# Patient Record
Sex: Male | Born: 1940 | Race: White | Hispanic: No | Marital: Single | Smoking: Current some day smoker
Health system: Southern US, Community
[De-identification: ages and names within clinical notes are randomized; demographics above are authoritative.]

## PROBLEM LIST (undated history)

## (undated) DIAGNOSIS — H409 Unspecified glaucoma: Secondary | ICD-10-CM

## (undated) DIAGNOSIS — R3911 Hesitancy of micturition: Secondary | ICD-10-CM

## (undated) DIAGNOSIS — I10 Essential (primary) hypertension: Secondary | ICD-10-CM

## (undated) DIAGNOSIS — K219 Gastro-esophageal reflux disease without esophagitis: Secondary | ICD-10-CM

## (undated) DIAGNOSIS — N3943 Post-void dribbling: Secondary | ICD-10-CM

## (undated) DIAGNOSIS — E669 Obesity, unspecified: Secondary | ICD-10-CM

## (undated) DIAGNOSIS — N400 Enlarged prostate without lower urinary tract symptoms: Secondary | ICD-10-CM

## (undated) DIAGNOSIS — D126 Benign neoplasm of colon, unspecified: Secondary | ICD-10-CM

## (undated) DIAGNOSIS — N3281 Overactive bladder: Secondary | ICD-10-CM

## (undated) DIAGNOSIS — E291 Testicular hypofunction: Secondary | ICD-10-CM

## (undated) DIAGNOSIS — N529 Male erectile dysfunction, unspecified: Secondary | ICD-10-CM

## (undated) DIAGNOSIS — R5383 Other fatigue: Secondary | ICD-10-CM

## (undated) DIAGNOSIS — C801 Malignant (primary) neoplasm, unspecified: Secondary | ICD-10-CM

## (undated) DIAGNOSIS — R351 Nocturia: Secondary | ICD-10-CM

## (undated) HISTORY — PX: HERNIA REPAIR: SHX51

## (undated) HISTORY — DX: Hesitancy of micturition: R39.11

## (undated) HISTORY — DX: Male erectile dysfunction, unspecified: N52.9

## (undated) HISTORY — DX: Post-void dribbling: N39.43

## (undated) HISTORY — DX: Essential (primary) hypertension: I10

## (undated) HISTORY — DX: Unspecified glaucoma: H40.9

## (undated) HISTORY — DX: Overactive bladder: N32.81

## (undated) HISTORY — DX: Gastro-esophageal reflux disease without esophagitis: K21.9

## (undated) HISTORY — DX: Benign prostatic hyperplasia without lower urinary tract symptoms: N40.0

## (undated) HISTORY — DX: Obesity, unspecified: E66.9

## (undated) HISTORY — DX: Testicular hypofunction: E29.1

## (undated) HISTORY — PX: BOWEL RESECTION: SHX1257

## (undated) HISTORY — DX: Nocturia: R35.1

## (undated) HISTORY — DX: Benign neoplasm of colon, unspecified: D12.6

---

## 2006-01-18 ENCOUNTER — Ambulatory Visit: Payer: Self-pay | Admitting: Gastroenterology

## 2011-05-25 ENCOUNTER — Ambulatory Visit: Payer: Self-pay | Admitting: Gastroenterology

## 2011-05-27 LAB — PATHOLOGY REPORT

## 2013-08-07 DIAGNOSIS — R7303 Prediabetes: Secondary | ICD-10-CM | POA: Insufficient documentation

## 2013-08-07 DIAGNOSIS — M6530 Trigger finger, unspecified finger: Secondary | ICD-10-CM | POA: Insufficient documentation

## 2013-08-07 DIAGNOSIS — N400 Enlarged prostate without lower urinary tract symptoms: Secondary | ICD-10-CM | POA: Insufficient documentation

## 2013-08-07 DIAGNOSIS — H409 Unspecified glaucoma: Secondary | ICD-10-CM

## 2013-08-07 HISTORY — DX: Unspecified glaucoma: H40.9

## 2014-02-07 DIAGNOSIS — M19049 Primary osteoarthritis, unspecified hand: Secondary | ICD-10-CM | POA: Insufficient documentation

## 2014-03-28 DIAGNOSIS — L309 Dermatitis, unspecified: Secondary | ICD-10-CM | POA: Insufficient documentation

## 2014-08-12 DIAGNOSIS — D126 Benign neoplasm of colon, unspecified: Secondary | ICD-10-CM

## 2014-08-12 HISTORY — DX: Benign neoplasm of colon, unspecified: D12.6

## 2014-08-13 DIAGNOSIS — E291 Testicular hypofunction: Secondary | ICD-10-CM | POA: Insufficient documentation

## 2014-08-13 DIAGNOSIS — Z79899 Other long term (current) drug therapy: Secondary | ICD-10-CM | POA: Insufficient documentation

## 2015-02-18 DIAGNOSIS — IMO0002 Reserved for concepts with insufficient information to code with codable children: Secondary | ICD-10-CM | POA: Insufficient documentation

## 2015-02-22 ENCOUNTER — Other Ambulatory Visit: Payer: Self-pay | Admitting: Urology

## 2015-02-22 DIAGNOSIS — N400 Enlarged prostate without lower urinary tract symptoms: Secondary | ICD-10-CM

## 2015-05-21 ENCOUNTER — Encounter: Payer: Self-pay | Admitting: *Deleted

## 2015-05-22 ENCOUNTER — Other Ambulatory Visit: Payer: Self-pay | Admitting: Urology

## 2015-05-22 ENCOUNTER — Other Ambulatory Visit: Payer: Self-pay

## 2015-05-22 DIAGNOSIS — R972 Elevated prostate specific antigen [PSA]: Secondary | ICD-10-CM

## 2015-05-22 NOTE — Telephone Encounter (Signed)
Patient is requesting a refill on his finasteride.  He will need an appointment for his yearly physical.  He may have refills to get him to his appointment time.

## 2015-05-23 ENCOUNTER — Other Ambulatory Visit: Payer: Self-pay

## 2015-05-23 ENCOUNTER — Other Ambulatory Visit: Payer: PPO

## 2015-05-23 ENCOUNTER — Other Ambulatory Visit: Payer: Self-pay | Admitting: Urology

## 2015-05-23 DIAGNOSIS — N400 Enlarged prostate without lower urinary tract symptoms: Secondary | ICD-10-CM

## 2015-05-23 DIAGNOSIS — R972 Elevated prostate specific antigen [PSA]: Secondary | ICD-10-CM

## 2015-05-23 MED ORDER — FINASTERIDE 5 MG PO TABS: 5.0000 mg | ORAL_TABLET | Freq: Every day | ORAL | Status: DC

## 2015-05-23 NOTE — Progress Notes (Signed)
Pt medication was refilled due to pt having labs drawn this morning and f/u appt with [facility] 06/02/15.

## 2015-05-23 NOTE — Telephone Encounter (Signed)
LMOM- unable to refill medication due to him needing a yearly f/u.

## 2015-05-24 LAB — PSA: Prostate Specific Ag, Serum: 0.3 ng/mL (ref 0.0–4.0)

## 2015-05-28 ENCOUNTER — Ambulatory Visit: Payer: Self-pay | Admitting: Urology

## 2015-06-03 ENCOUNTER — Encounter: Payer: Self-pay | Admitting: Urology

## 2015-06-03 ENCOUNTER — Ambulatory Visit (INDEPENDENT_AMBULATORY_CARE_PROVIDER_SITE_OTHER): Payer: PPO | Admitting: Urology

## 2015-06-03 VITALS — BP 145/80 | HR 73 | Ht 70.0 in | Wt 257.0 lb

## 2015-06-03 DIAGNOSIS — K219 Gastro-esophageal reflux disease without esophagitis: Secondary | ICD-10-CM

## 2015-06-03 DIAGNOSIS — N529 Male erectile dysfunction, unspecified: Secondary | ICD-10-CM | POA: Insufficient documentation

## 2015-06-03 DIAGNOSIS — N528 Other male erectile dysfunction: Secondary | ICD-10-CM | POA: Diagnosis not present

## 2015-06-03 DIAGNOSIS — N401 Enlarged prostate with lower urinary tract symptoms: Secondary | ICD-10-CM | POA: Diagnosis not present

## 2015-06-03 DIAGNOSIS — N138 Other obstructive and reflux uropathy: Secondary | ICD-10-CM

## 2015-06-03 DIAGNOSIS — M545 Low back pain, unspecified: Secondary | ICD-10-CM | POA: Insufficient documentation

## 2015-06-03 DIAGNOSIS — I10 Essential (primary) hypertension: Secondary | ICD-10-CM

## 2015-06-03 DIAGNOSIS — E785 Hyperlipidemia, unspecified: Secondary | ICD-10-CM | POA: Insufficient documentation

## 2015-06-03 HISTORY — DX: Essential (primary) hypertension: I10

## 2015-06-03 HISTORY — DX: Gastro-esophageal reflux disease without esophagitis: K21.9

## 2015-06-03 MED ORDER — FINASTERIDE 5 MG PO TABS: 5.0000 mg | ORAL_TABLET | Freq: Every day | ORAL | Status: DC

## 2015-06-03 NOTE — Progress Notes (Signed)
06/03/2015 10:04 AM   Zachary Robertson [birth year] [id number]  Referring provider: No referring provider defined for this encounter.  Chief Complaint  Patient presents with  . Benign Prostatic Hypertrophy    1year    HPI: Patient is 75 year old Caucasian male who presents today for her 1 year follow-up for erectile dysfunction and BPH with LUTS.  Erectile dysfunction His SHIM score is 5, which is severe erectile dysfunction.   He has been having difficulty with erections for over ten years.   His major complaint is no partner.  His libido is preserved.   His risk factors for ED are age, HTN, BPH and HLD.  He denies any painful erections or curvatures with his erections.   I had given him Viagra samples at his office visit last year, but he has not taken the samples.      SHIM      06/03/15 0951       SHIM: Over the last 6 months:   How do you rate your confidence that you could get and keep an erection? Very Low     When you had erections with sexual stimulation, how often were your erections hard enough for penetration (entering your partner)? Almost Never or Never     During sexual intercourse, how often were you able to maintain your erection after you had penetrated (entered) your partner? Extremely Difficult     During sexual intercourse, how difficult was it to maintain your erection to completion of intercourse? Extremely Difficult     When you attempted sexual intercourse, how often was it satisfactory for you? Extremely Difficult     SHIM Total Score   SHIM 5        Score: 1-7 Severe ED 8-11 Moderate ED 12-16 Mild-Moderate ED 17-21 Mild ED 22-25 No ED   BPH WITH LUTS His IPSS score today is 6, which is mild lower urinary tract symptomatology. He is pleased with his quality life due to his urinary symptoms.  He denies any dysuria, hematuria or suprapubic pain.   He currently taking finasteride 5 mg daily.  He also denies any recent fevers, chills, nausea or  vomiting.   He has a family history of PCa, with his father being diagnosed with prostate cancer.        IPSS      06/03/15 0900       International Prostate Symptom Score   How often have you had the sensation of not emptying your bladder? Less than 1 in 5     How often have you had to urinate less than every two hours? Less than 1 in 5 times     How often have you found you stopped and started again several times when you urinated? Not at All     How often have you found it difficult to postpone urination? Less than half the time     How often have you had a weak urinary stream? Less than 1 in 5 times     How often have you had to strain to start urination? Not at All     How many times did you typically get up at night to urinate? 1 Time     Total IPSS Score 6     Quality of Life due to urinary symptoms   If you were to spend the rest of your life with your urinary condition just the way it is now how would you feel about that? Pleased  Score:  1-7 Mild 8-19 Moderate 20-35 Severe   PMH: Past Medical History  Diagnosis Date  . GERD (gastroesophageal reflux disease)   . Nocturia   . Urgency-frequency syndrome   . Dribbling   . Hesitancy   . HTN (hypertension)   . BPH (benign prostatic hyperplasia)   . Obese   . ED (erectile dysfunction)   . Hypogonadism in male   . Essential (primary) hypertension 06/03/2015  . Acid reflux 06/03/2015  . Adenomatous colon polyp 08/12/2014    Overview:  On 05/25/2011 colonoscopy; repeat 05/2016 (Dr. Gustavo Lah)   . Glaucoma 08/07/2013    Surgical History: Past Surgical History  Procedure Laterality Date  . Hernia repair      Home Medications:    Medication List       This list is accurate as of: 06/03/15 10:04 AM.  Always use your most recent med list.               aspirin EC 81 MG tablet  Take by mouth.     finasteride 5 MG tablet  Commonly known as:  PROSCAR  Take 1 tablet (5 mg total) by mouth daily.     Fish Oil  1200 MG Caps  Take by mouth.     ibuprofen 200 MG tablet  Commonly known as:  ADVIL,MOTRIN  Take by mouth.     latanoprost 0.005 % ophthalmic solution  Commonly known as:  XALATAN     lisinopril-hydrochlorothiazide 10-12.5 MG tablet  Commonly known as:  PRINZIDE,ZESTORETIC  Take by mouth.     timolol 0.5 % ophthalmic solution  Commonly known as:  TIMOPTIC  Apply to eye.        Allergies: No Known Allergies  Family History: Family History  Problem Relation Age of Onset  . Prostate cancer Father   . Cancer      family members in general  . Bladder Cancer Neg Hx   . Kidney cancer Neg Hx     Social History:  reports that he has quit smoking. He does not have any smokeless tobacco history on file. He reports that he drinks alcohol. He reports that he does not use illicit drugs.  ROS: UROLOGY Frequent Urination?: No Hard to postpone urination?: Yes Burning/pain with urination?: No Get up at night to urinate?: Yes Leakage of urine?: No Urine stream starts and stops?: No Trouble starting stream?: No Do you have to strain to urinate?: No Blood in urine?: No Urinary tract infection?: No Sexually transmitted disease?: No Injury to kidneys or bladder?: No Painful intercourse?: No Weak stream?: No Erection problems?: No Penile pain?: No  Gastrointestinal Nausea?: No Vomiting?: No Indigestion/heartburn?: No Diarrhea?: No Constipation?: No  Constitutional Fever: No Night sweats?: No Weight loss?: No Fatigue?: No  Skin Skin rash/lesions?: No Itching?: No  Eyes Blurred vision?: No Double vision?: No  Ears/Nose/Throat Sore throat?: No Sinus problems?: No  Hematologic/Lymphatic Swollen glands?: No Easy bruising?: No  Cardiovascular Leg swelling?: No Chest pain?: No  Respiratory Cough?: No Shortness of breath?: Yes  Endocrine Excessive thirst?: No  Musculoskeletal Back pain?: No Joint pain?: No  Neurological Headaches?: No Dizziness?:  No  Psychologic Depression?: No Anxiety?: No  Physical Exam: BP 145/80 mmHg  Pulse 73  Ht 5\' 10"  (1.778 m)  Wt 257 lb (116.574 kg)  BMI 36.88 kg/m2  Constitutional: Well nourished. Alert and oriented, No acute distress. HEENT: [place] AT, moist mucus membranes. Trachea midline, no masses. Cardiovascular: No clubbing, cyanosis, or edema. Respiratory: Normal  respiratory effort, no increased work of breathing. GI: Abdomen is soft, non tender, non distended, no abdominal masses. Liver and spleen not palpable.  No hernias appreciated.  Stool sample for occult testing is not indicated.   GU: No CVA tenderness.  No bladder fullness or masses.  Patient with uncircumcised phallus. Foreskin easily retracted.  Crusted smegma is noted.   Urethral meatus is patent.  No penile discharge. No penile lesions or rashes. Scrotum without lesions, cysts, rashes and/or edema.  Testicles are located scrotally bilaterally. No masses are appreciated in the testicles. Left and right epididymis are normal. Rectal: Patient with  normal sphincter tone. Anus and perineum without scarring or rashes. No rectal masses are appreciated. Prostate is approximately 45 grams, no nodules are appreciated. Seminal vesicles are normal. Skin: No rashes, bruises or suspicious lesions. Lymph: No cervical or inguinal adenopathy. Neurologic: Grossly intact, no focal deficits, moving all 4 extremities. Psychiatric: Normal mood and affect.  Laboratory Data: PSA History  0.3 ng/mL on 05/23/2015    Assessment & Plan:    1. Erectile dysfunction:   SHIM score is 5.  We discussed trying a different PDE5 inhibitor, intra-urethral suppositories, intracavernous vasoactive drug injection therapy, vacuum constriction device and penile prosthesis implantation.   He does not have partner at this time.  He would like another sample of the Viagra.   Have given him Viagra 100 mg samples and advised to take him 2 hours before intercourse on an empty  stomach.  He is warned not to take the Viagra with medications that contain nitrates.  I also advised him of the side effects, such as: headache, flushing, dyspepsia, abnormal vision, nasal congestion, back pain, myalgia, nausea, dizziness, and rash.  He will return in 1 year for [facility] IM score and exam.  2. BPH with LUTS:   IPSS score is 6/1.  He will continue the finasteride 5 mg daily, dutasteride 0.5 mg daily.  We will continue to monitor.  He will RTC to have his IPSS score, exam and PSA in 12 months.    Return in about 1 year (around 06/02/2016) for IPSS score, SHIM score and exam.  These notes generated with voice recognition software. I apologize for typographical errors.  Zara Council, [facility] [address] [place], [place] [address] [phone]

## 2015-06-07 ENCOUNTER — Telehealth: Payer: Self-pay

## 2015-06-07 NOTE — Telephone Encounter (Signed)
-----   Message from Nori Riis, PA-C sent at 06/04/2015  1:51 PM EDT ----- PSA is stable.  We will see him in one year.

## 2015-06-07 NOTE — Telephone Encounter (Signed)
Spoke with pt in reference to PSA. Pt voiced understanding.  

## 2015-06-16 ENCOUNTER — Inpatient Hospital Stay: Payer: PPO

## 2015-06-16 ENCOUNTER — Emergency Department: Payer: PPO

## 2015-06-16 ENCOUNTER — Encounter: Payer: Self-pay | Admitting: Emergency Medicine

## 2015-06-16 ENCOUNTER — Inpatient Hospital Stay
Admission: EM | Admit: 2015-06-16 | Discharge: 2015-06-20 | DRG: 389 | Disposition: A | Discharge status: Home / Self Care | Payer: PPO | Attending: General Surgery | Admitting: General Surgery

## 2015-06-16 DIAGNOSIS — E871 Hypo-osmolality and hyponatremia: Secondary | ICD-10-CM | POA: Diagnosis not present

## 2015-06-16 DIAGNOSIS — K6389 Other specified diseases of intestine: Secondary | ICD-10-CM | POA: Diagnosis not present

## 2015-06-16 DIAGNOSIS — Z4682 Encounter for fitting and adjustment of non-vascular catheter: Secondary | ICD-10-CM | POA: Diagnosis not present

## 2015-06-16 DIAGNOSIS — N400 Enlarged prostate without lower urinary tract symptoms: Secondary | ICD-10-CM | POA: Diagnosis present

## 2015-06-16 DIAGNOSIS — L309 Dermatitis, unspecified: Secondary | ICD-10-CM | POA: Diagnosis present

## 2015-06-16 DIAGNOSIS — K219 Gastro-esophageal reflux disease without esophagitis: Secondary | ICD-10-CM | POA: Diagnosis present

## 2015-06-16 DIAGNOSIS — N529 Male erectile dysfunction, unspecified: Secondary | ICD-10-CM | POA: Diagnosis not present

## 2015-06-16 DIAGNOSIS — E230 Hypopituitarism: Secondary | ICD-10-CM | POA: Diagnosis not present

## 2015-06-16 DIAGNOSIS — N289 Disorder of kidney and ureter, unspecified: Secondary | ICD-10-CM | POA: Diagnosis not present

## 2015-06-16 DIAGNOSIS — K56609 Unspecified intestinal obstruction, unspecified as to partial versus complete obstruction: Secondary | ICD-10-CM | POA: Diagnosis present

## 2015-06-16 DIAGNOSIS — E669 Obesity, unspecified: Secondary | ICD-10-CM | POA: Diagnosis not present

## 2015-06-16 DIAGNOSIS — I10 Essential (primary) hypertension: Secondary | ICD-10-CM | POA: Diagnosis not present

## 2015-06-16 DIAGNOSIS — K5669 Other intestinal obstruction: Secondary | ICD-10-CM | POA: Diagnosis not present

## 2015-06-16 DIAGNOSIS — K5660 Unspecified intestinal obstruction: Secondary | ICD-10-CM | POA: Diagnosis not present

## 2015-06-16 DIAGNOSIS — K631 Perforation of intestine (nontraumatic): Secondary | ICD-10-CM | POA: Diagnosis not present

## 2015-06-16 DIAGNOSIS — Z87891 Personal history of nicotine dependence: Secondary | ICD-10-CM

## 2015-06-16 LAB — COMPREHENSIVE METABOLIC PANEL
ALBUMIN: 3.6 g/dL (ref 3.5–5.0)
ALT: 86 U/L — AB (ref 17–63)
AST: 53 U/L — AB (ref 15–41)
Alkaline Phosphatase: 81 U/L (ref 38–126)
Anion gap: 15 (ref 5–15)
BILIRUBIN TOTAL: 1 mg/dL (ref 0.3–1.2)
BUN: 67 mg/dL — AB (ref 6–20)
CO2: 25 mmol/L (ref 22–32)
CREATININE: 1.55 mg/dL — AB (ref 0.61–1.24)
Calcium: 9.5 mg/dL (ref 8.9–10.3)
Chloride: 89 mmol/L — ABNORMAL LOW (ref 101–111)
GFR calc Af Amer: 49 mL/min — ABNORMAL LOW (ref >60)
GFR, EST NON AFRICAN AMERICAN: 42 mL/min — AB (ref >60)
GLUCOSE: 193 mg/dL — AB (ref 65–99)
Potassium: 3.6 mmol/L (ref 3.5–5.1)
Sodium: 129 mmol/L — ABNORMAL LOW (ref 135–145)
TOTAL PROTEIN: 8.2 g/dL — AB (ref 6.5–8.1)

## 2015-06-16 LAB — CBC
HEMATOCRIT: 42.6 % (ref 40.0–52.0)
HEMOGLOBIN: 14.6 g/dL (ref 13.0–18.0)
MCH: 30.1 pg (ref 26.0–34.0)
MCHC: 34.4 g/dL (ref 32.0–36.0)
MCV: 87.6 fL (ref 80.0–100.0)
Platelets: 283 10*3/uL (ref 150–440)
RBC: 4.86 MIL/uL (ref 4.40–5.90)
RDW: 13.6 % (ref 11.5–14.5)
WBC: 7.5 10*3/uL (ref 3.8–10.6)

## 2015-06-16 LAB — LIPASE, BLOOD: Lipase: 12 U/L (ref 11–51)

## 2015-06-16 LAB — GLUCOSE, CAPILLARY
GLUCOSE-CAPILLARY: 127 mg/dL — AB (ref 65–99)
Glucose-Capillary: 143 mg/dL — ABNORMAL HIGH (ref 65–99)

## 2015-06-16 LAB — RAPID INFLUENZA A&B ANTIGENS
Influenza A (ARMC): NEGATIVE
Influenza B (ARMC): NEGATIVE

## 2015-06-16 MED ORDER — ONDANSETRON HCL 4 MG/2ML IJ SOLN
4.0000 mg | Freq: Once | INTRAMUSCULAR | Status: AC
  Administered 2015-06-16: 4 mg via INTRAVENOUS
  Filled 2015-06-16: qty 2

## 2015-06-16 MED ORDER — DIPHENHYDRAMINE HCL 12.5 MG/5ML PO ELIX: 12.5000 mg | ORAL_SOLUTION | Freq: Four times a day (QID) | ORAL | Status: DC | PRN

## 2015-06-16 MED ORDER — KCL IN DEXTROSE-NACL 20-5-0.45 MEQ/L-%-% IV SOLN
INTRAVENOUS | Status: DC
  Administered 2015-06-16 – 2015-06-20 (×10): via INTRAVENOUS
  Filled 2015-06-16 (×14): qty 1000

## 2015-06-16 MED ORDER — DIPHENHYDRAMINE HCL 50 MG/ML IJ SOLN: 12.5000 mg | Freq: Four times a day (QID) | INTRAMUSCULAR | Status: DC | PRN

## 2015-06-16 MED ORDER — LATANOPROST 0.005 % OP SOLN
1.0000 [drp] | OPHTHALMIC | Status: DC
  Administered 2015-06-18 – 2015-06-19 (×2): 1 [drp] via OPHTHALMIC
  Filled 2015-06-16 (×2): qty 2.5

## 2015-06-16 MED ORDER — FAMOTIDINE IN NACL 20-0.9 MG/50ML-% IV SOLN
20.0000 mg | Freq: Once | INTRAVENOUS | Status: AC
  Administered 2015-06-16: 20 mg via INTRAVENOUS
  Filled 2015-06-16: qty 50

## 2015-06-16 MED ORDER — IOPAMIDOL (ISOVUE-300) INJECTION 61%
75.0000 mL | Freq: Once | INTRAVENOUS | Status: AC | PRN
  Administered 2015-06-16: 75 mL via INTRAVENOUS

## 2015-06-16 MED ORDER — MORPHINE SULFATE (PF) 4 MG/ML IV SOLN: 4.0000 mg | INTRAVENOUS | Status: DC | PRN

## 2015-06-16 MED ORDER — HYDRALAZINE HCL 20 MG/ML IJ SOLN: 10.0000 mg | INTRAMUSCULAR | Status: DC | PRN

## 2015-06-16 MED ORDER — INSULIN ASPART 100 UNIT/ML ~~LOC~~ SOLN
0.0000 [IU] | Freq: Three times a day (TID) | SUBCUTANEOUS | Status: DC
  Administered 2015-06-16: 3 [IU] via SUBCUTANEOUS
  Filled 2015-06-16: qty 3

## 2015-06-16 MED ORDER — PANTOPRAZOLE SODIUM 40 MG IV SOLR
40.0000 mg | Freq: Every day | INTRAVENOUS | Status: DC
  Administered 2015-06-16 – 2015-06-19 (×4): 40 mg via INTRAVENOUS
  Filled 2015-06-16 (×4): qty 40

## 2015-06-16 MED ORDER — ONDANSETRON 8 MG PO TBDP: 4.0000 mg | ORAL_TABLET | Freq: Four times a day (QID) | ORAL | Status: DC | PRN

## 2015-06-16 MED ORDER — PROMETHAZINE HCL 25 MG/ML IJ SOLN
12.5000 mg | Freq: Four times a day (QID) | INTRAMUSCULAR | Status: DC | PRN
  Administered 2015-06-16: 12.5 mg via INTRAVENOUS
  Filled 2015-06-16: qty 1

## 2015-06-16 MED ORDER — TIMOLOL MALEATE 0.5 % OP SOLN
1.0000 [drp] | Freq: Two times a day (BID) | OPHTHALMIC | Status: DC
  Administered 2015-06-16 – 2015-06-20 (×8): 1 [drp] via OPHTHALMIC
  Filled 2015-06-16: qty 5

## 2015-06-16 MED ORDER — DIATRIZOATE MEGLUMINE & SODIUM 66-10 % PO SOLN
25.0000 mL | Freq: Once | ORAL | Status: AC
  Administered 2015-06-16: 25 mL via ORAL

## 2015-06-16 MED ORDER — ONDANSETRON HCL 4 MG/2ML IJ SOLN: 4.0000 mg | Freq: Once | INTRAMUSCULAR | Status: DC

## 2015-06-16 MED ORDER — PROMETHAZINE HCL 25 MG/ML IJ SOLN
25.0000 mg | Freq: Once | INTRAMUSCULAR | Status: AC
  Administered 2015-06-16: 25 mg via INTRAVENOUS
  Filled 2015-06-16: qty 1

## 2015-06-16 MED ORDER — SODIUM CHLORIDE 0.9 % IV BOLUS (SEPSIS)
1000.0000 mL | Freq: Once | INTRAVENOUS | Status: AC
  Administered 2015-06-16: 1000 mL via INTRAVENOUS

## 2015-06-16 MED ORDER — ONDANSETRON HCL 4 MG/2ML IJ SOLN
4.0000 mg | Freq: Four times a day (QID) | INTRAMUSCULAR | Status: DC | PRN
  Administered 2015-06-16 – 2015-06-18 (×2): 4 mg via INTRAVENOUS
  Filled 2015-06-16 (×2): qty 2

## 2015-06-16 MED ORDER — ENOXAPARIN SODIUM 40 MG/0.4ML ~~LOC~~ SOLN
40.0000 mg | SUBCUTANEOUS | Status: DC
  Administered 2015-06-16 – 2015-06-19 (×4): 40 mg via SUBCUTANEOUS
  Filled 2015-06-16 (×4): qty 0.4

## 2015-06-16 MED ORDER — LATANOPROST 0.005 % OP SOLN
1.0000 [drp] | Freq: Every day | OPHTHALMIC | Status: DC
  Filled 2015-06-16: qty 2.5

## 2015-06-16 MED ORDER — HYDROMORPHONE HCL 1 MG/ML IJ SOLN
1.0000 mg | Freq: Once | INTRAMUSCULAR | Status: AC
  Administered 2015-06-16: 1 mg via INTRAVENOUS
  Filled 2015-06-16: qty 1

## 2015-06-16 MED ORDER — HYDROMORPHONE HCL 1 MG/ML IJ SOLN
0.5000 mg | Freq: Once | INTRAMUSCULAR | Status: AC
  Administered 2015-06-16: 0.5 mg via INTRAVENOUS
  Filled 2015-06-16: qty 1

## 2015-06-16 NOTE — Progress Notes (Signed)
Notified Dr. Dahlia Byes of pt's nausea and not time for zofran until 0000 - pt stated that zofran doesn't help as much as the phenergan (he received this in the ER). MD gave orders for phenergan.

## 2015-06-16 NOTE — ED Notes (Signed)
Report given to Vern, Therapist, sports.

## 2015-06-16 NOTE — H&P (Signed)
Patient ID: Zachary Robertson, male   DOB: 24-Aug-1940, 75 y.o.   MRN: [id number]  CC: Nausea and Vomiting HPI Zachary Robertson is a 75 y.o. male who presents to emergency department with a 6 day history of abdominal pain with nausea, vomiting, diarrhea. Patient states that he had nausea and vomiting first followed by small volume diarrhea. The diarrhea has decreased. With all this he has also had diffuse abdominal pain. He denies any fevers or chills but has significant worsening in his nausea and vomiting if he lays supine. He's never had anything like this before. Attempting to eat also makes it worse. He was trying to tough it out at home but after the fifth day of dumping out to keep anything down he decided it was time to be evaluated at the hospital. He denies any chest pain, shortness of breath, dizziness, change in mentation, malaise, change in energy.  HPI  Past Medical History  Diagnosis Date  . GERD (gastroesophageal reflux disease)   . Nocturia   . Urgency-frequency syndrome   . Dribbling   . Hesitancy   . HTN (hypertension)   . BPH (benign prostatic hyperplasia)   . Obese   . ED (erectile dysfunction)   . Hypogonadism in male   . Essential (primary) hypertension 06/03/2015  . Acid reflux 06/03/2015  . Adenomatous colon polyp 08/12/2014    Overview:  On 05/25/2011 colonoscopy; repeat 05/2016 (Dr. Gustavo Lah)   . Glaucoma 08/07/2013    Past Surgical History  Procedure Laterality Date  . Hernia repair      Family History  Problem Relation Age of Onset  . Prostate cancer Father   . Cancer      family members in general  . Bladder Cancer Neg Hx   . Kidney cancer Neg Hx     Social History Social History  Substance Use Topics  . Smoking status: Former Research scientist (life sciences)  . Smokeless tobacco: None     Comment: occasional cigar  . Alcohol Use: 0.0 oz/week    0 Standard drinks or equivalent per week     Comment: moderate    No Known Allergies  Current Facility-Administered Medications   Medication Dose Route Frequency Provider Last Rate Last Dose  . famotidine (PEPCID) IVPB 20 mg premix  20 mg Intravenous Once Eula Listen, MD 100 mL/hr at 06/16/15 1315 20 mg at 06/16/15 1315   Current Outpatient Prescriptions  Medication Sig Dispense Refill  . aspirin EC 81 MG tablet Take by mouth.    . finasteride (PROSCAR) 5 MG tablet Take 1 tablet (5 mg total) by mouth daily. 90 tablet 3  . ibuprofen (ADVIL,MOTRIN) 200 MG tablet Take by mouth.    . latanoprost (XALATAN) 0.005 % ophthalmic solution     . lisinopril-hydrochlorothiazide (PRINZIDE,ZESTORETIC) 10-12.5 MG tablet Take by mouth.    . Omega-3 Fatty Acids (FISH OIL) 1200 MG CAPS Take by mouth.    . timolol (TIMOPTIC) 0.5 % ophthalmic solution Apply to eye.       Review of Systems A Multi-point review of systems was asked and was negative except for the findings documented in the history of present illness  Physical Exam Blood pressure 145/71, pulse 78, temperature 98.2 F (36.8 C), temperature source Oral, resp. rate 22, height 5\' 10"  (1.778 m), weight 113.399 kg (250 lb), SpO2 100 %. CONSTITUTIONAL: No acute distress. EYES: Pupils are equal, round, and reactive to light, Sclera are non-icteric. EARS, NOSE, MOUTH AND THROAT: The oropharynx is clear. The oral mucosa  is pink and moist. Hearing is intact to voice. LYMPH NODES:  Lymph nodes in the neck are normal. RESPIRATORY:  Lungs are clear. There is normal respiratory effort, with equal breath sounds bilaterally, and without pathologic use of accessory muscles. CARDIOVASCULAR: Heart is regular without murmurs, gallops, or rubs. GI: The abdomen is large, soft, tender to deep palpation in all quadrants but worse in the right lower quadrant. Mildly distended and tympanic. No evidence of peritoneal signs on exam There are no palpable masses. There is no hepatosplenomegaly. There are normal bowel sounds in all quadrants. GU: Rectal deferred.   MUSCULOSKELETAL: Normal  muscle strength and tone. No cyanosis or edema.   SKIN: Turgor is good and there are no pathologic skin lesions or ulcers. NEUROLOGIC: Motor and sensation is grossly normal. Cranial nerves are grossly intact. PSYCH:  Oriented to person, place and time. Affect is normal.  Data Reviewed Images and labs reviewed. Labs are mostly within normal limits with a mild hyponatremia at 129, mild elevation in BUN and creatinine of 67 and 1.55, mild elevation in transaminases with AST of 53 and ALT of 86. No leukocytosis with white blood cell count 7.5. CT scan with pan dilatation of the entire small bowel and stomach without a clear transition point. There appears to be inflammation of the cecum at the insertion of the ileocecal valve. The colon is mostly decompressed but with evidence of diverticulosis without diverticulitis. There is no free air or free fluid. This is consistent with a high-grade bowel obstruction. I have personally reviewed the patient's imaging, laboratory findings and medical records.    Assessment    High-grade bowel obstruction    Plan    75 year old male with a high-grade bowel obstruction. Plan for admission to the MedSurg unit for nasogastric decompression, IV fluids, as needed medications. Dust at length with the patient and family that the majority of bowel obstructions resolve with conservative measures however in the setting of high-grade bowel obstruction the risk for needing operation is higher. Plan for 24-48 hours of nasal decompression to evaluate for whether or not he will improve. Discussed that any point he actually worsens he may require urgent operative intervention. All questions were answered to the patient and his family's satisfaction admit to [place] ward, IV fluids, as needed medications, nasogastric decompression.     Time spent with the patient was 60 minutes, with more than 50% of the time spent in face-to-face education, counseling and care coordination.      Clayburn Pert, MD FACS General Surgeon 06/16/2015, 1:43 PM

## 2015-06-16 NOTE — Progress Notes (Signed)
S/p education related to patient safety, patient refused bed alarm.

## 2015-06-16 NOTE — ED Provider Notes (Addendum)
[facility] Emergency Department Provider Note  ____________________________________________  Time seen: Approximately 10:32 AM  I have reviewed the triage vital signs and the nursing notes.   HISTORY  Chief Complaint Abdominal Pain and Emesis    HPI Zachary Robertson is a 75 y.o. male status post remote hernia repair presenting with abdominal pain, nausea vomiting and diarrhea. Patient reports that 6 days ago he developed nonbloody nausea vomiting and diarrhea associated with diffuse abdominal pain. His diarrhea resolved 2 days ago and he has not had a bowel movement since then. He has noticed increased abdominal bloating and pain that is diffuse but worse in the right lower quadrant. He denies any fever or chills. Lying down makes his pain worse.   Past Medical History  Diagnosis Date  . GERD (gastroesophageal reflux disease)   . Nocturia   . Urgency-frequency syndrome   . Dribbling   . Hesitancy   . HTN (hypertension)   . BPH (benign prostatic hyperplasia)   . Obese   . ED (erectile dysfunction)   . Hypogonadism in male   . Essential (primary) hypertension 06/03/2015  . Acid reflux 06/03/2015  . Adenomatous colon polyp 08/12/2014    Overview:  On 05/25/2011 colonoscopy; repeat 05/2016 (Dr. Gustavo Lah)   . Glaucoma 08/07/2013    Patient Active Problem List   Diagnosis Date Noted  . Acid reflux 06/03/2015  . Essential (primary) hypertension 06/03/2015  . HLD (hyperlipidemia) 06/03/2015  . LBP (low back pain) 06/03/2015  . BPH with obstruction/lower urinary tract symptoms 06/03/2015  . 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 Adult BMI 30+ 02/18/2015  . Other long term (current) drug therapy 08/13/2014  . 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 Adenomatous colon polyp 08/12/2014  . Dermatitis, eczematoid 03/28/2014  . 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 Glaucoma 08/07/2013  . 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 Triggering of digit 08/07/2013    Past Surgical History  Procedure Laterality Date  . Hernia repair      Current Outpatient Rx  Name  Route  Sig  Dispense  Refill  . aspirin EC 81 MG tablet   Oral   Take by mouth.         . finasteride (PROSCAR) 5 MG tablet   Oral   Take 1 tablet (5 mg total) by mouth daily.   90 tablet   3   . ibuprofen (ADVIL,MOTRIN) 200 MG tablet   Oral   Take by mouth.         . latanoprost (XALATAN) 0.005 % ophthalmic solution               . lisinopril-hydrochlorothiazide (PRINZIDE,ZESTORETIC) 10-12.5 MG tablet   Oral   Take by mouth.         . Omega-3 Fatty Acids (FISH OIL) 1200 MG CAPS   Oral   Take by mouth.         . timolol (TIMOPTIC) 0.5 % ophthalmic solution   Ophthalmic   Apply to eye.           Allergies Review of patient's allergies indicates no known allergies.  Family History  Problem Relation Age of Onset  . Prostate cancer Father   . Cancer      family members in general  . Bladder Cancer Neg Hx   . Kidney cancer Neg Hx     Social History Social History  Substance Use Topics  . Smoking status: Former Research scientist (life sciences)  .  Smokeless tobacco: None     Comment: occasional cigar  . Alcohol Use: 0.0 oz/week    0 Standard drinks or equivalent per week     Comment: moderate    Review of Systems Constitutional: No fever/chills. No lightheadedness or syncope. Eyes: No visual changes. No scleral icterus. ENT: No sore throat. Cardiovascular: Denies chest pain, palpitations. Respiratory: Denies shortness of breath.  No cough. Gastrointestinal: Positive  abdominal pain.  Positive nausea, positive vomiting.  Positive diarrhea.  Positive constipation. Genitourinary: Negative for dysuria. No scrotal pain or testicular pain. Musculoskeletal: Negative for back pain. Skin: Negative for rash. Neurological: Negative for headaches, focal weakness or numbness.  10-point ROS otherwise  negative.  ____________________________________________   PHYSICAL EXAM:  VITAL SIGNS: ED Triage Vitals  Enc Vitals Group     BP 06/16/15 0926 126/68 mmHg     Pulse Rate 06/16/15 0926 102     Resp 06/16/15 0926 20     Temp 06/16/15 0926 98.2 F (36.8 C)     Temp Source 06/16/15 0926 Oral     SpO2 06/16/15 0926 92 %     Weight 06/16/15 0926 250 lb (113.399 kg)     Height 06/16/15 0926 5\' 10"  (1.778 m)     Head Cir --      Peak Flow --      Pain Score 06/16/15 0917 9     Pain Loc --      Pain Edu? --      Excl. in [place]? --     Constitutional: Patient is alert and oriented and answering questions appropriately. He is uncomfortable appearing but nontoxic  Eyes: Conjunctivae are normal.  EOMI. no scleral icterus. Head: Atraumatic. Nose: No congestion/rhinnorhea. Mouth/Throat: Mucous membranes are dry.  Neck: No stridor.  Supple.  No JVD. No meningismus. Cardiovascular: Normal rate, regular rhythm. No murmurs, rubs or gallops.  Respiratory: Normal respiratory effort.  No retractions. Lungs CTAB.  No wheezes, rales or ronchi. Gastrointestinal: Abdomen is overweight, distended and tympanic. Soft. Patient has diffuse tenderness but most notably is tender in the right lower quadrant. No guarding or rebound. No peritoneal signs. Absent bowel sounds. Musculoskeletal: No LE edema.  Neurologic:  Normal speech and language. No gross focal neurologic deficits are appreciated.  Skin:  Skin is warm, dry and intact. No rash noted. Psychiatric: Mood and affect are normal. Speech and behavior are normal.  Normal judgement.  ____________________________________________   LABS (all labs ordered are listed, but only abnormal results are displayed)  Labs Reviewed  COMPREHENSIVE METABOLIC PANEL - Abnormal; Notable for the following:    Sodium 129 (*)    Chloride 89 (*)    Glucose, Bld 193 (*)    BUN 67 (*)    Creatinine, Ser 1.55 (*)    Total Protein 8.2 (*)    AST 53 (*)    ALT 86 (*)     GFR calc non Af Amer 42 (*)    GFR calc Af Amer 49 (*)    All other components within normal limits  RAPID INFLUENZA A&B ANTIGENS ([facility] ONLY)  CBC  LIPASE, BLOOD   ____________________________________________  EKG  ED ECG REPORT I, Eula Listen, the attending physician, personally viewed and interpreted this ECG.   Date: 06/16/2015  EKG Time: 927  Rate: 100  Rhythm: sinus tachycardia  Axis: Normal  Intervals:none  ST&T Change: No ST elevation. Positive premature ventricular complex.  ____________________________________________  RADIOLOGY  Ct Abdomen Pelvis W Contrast  06/16/2015  CLINICAL DATA:  Chief Complaint Abdominal Pain and Emesis HPI LISH TROUTMAN is a 75 y.o. male status post remote hernia repair presenting with abdominal pain, nausea vomiting and diarrhea. Patient reports that 6 days ago he developed non blood emesis. EXAM: CT ABDOMEN AND PELVIS WITH CONTRAST TECHNIQUE: Multidetector CT imaging of the abdomen and pelvis was performed using the standard protocol following bolus administration of intravenous contrast. CONTRAST:  6mL ISOVUE-300 IOPAMIDOL (ISOVUE-300) INJECTION 61% COMPARISON:  All FINDINGS: Lower chest: Lung bases are clear. Hepatobiliary: No focal hepatic lesion. No biliary duct dilatation. Gallbladder is normal. Common bile duct is normal. Pancreas: Pancreas is normal. No ductal dilatation. No pancreatic inflammation. Spleen: Normal spleen Adrenals/urinary tract: Adrenal glands and kidneys are normal. The ureters and bladder normal. Stomach/Bowel: Stomach is fluid-filled. The duodenum and proximal small bowel are dilated. Proximal small bowel is fluid-filled measures up to 4 cm. The mid and distal small bowel are likewise dilated measuring up to 5 cm. The most distal small bowel leading up to the terminal ileum is completely collapsed (image 67, series 2). The findings are consistent with high-grade distal small bowel obstruction. No obstructing lesions  identified. Normal appendix. There is no pneumatosis intestinalis. No portal venous gas. No intraperitoneal free air. Vascular/Lymphatic: Mild calcification abdominal aorta. The SMA and celiac trunk are patent. IMA is patent. The superior mesenteric vein is patent. Reproductive: Prostate normal Other: No significant free fluid the abdomen pelvis. No abdominal for inguinal hernia. Musculoskeletal: No aggressive osseous lesion. IMPRESSION: High-grade distal small-bowel obstruction. No transition point identified but favor RIGHT lower quadrant as locale of obstruction. Findings conveyed toANNE-CAROLINE Nakiya Rallis on 06/16/2015  at12:47. Electronically Signed   By: Suzy Bouchard M.D.   On: 06/16/2015 12:47    ____________________________________________   PROCEDURES  Procedure(s) performed: None  Critical Care performed: No ____________________________________________   INITIAL IMPRESSION / ASSESSMENT AND PLAN / ED COURSE  Pertinent labs & imaging results that were available during my care of the patient were reviewed by me and considered in my medical decision making (see chart for details).  75 y.o. male with remote history of a single abdominal surgery presenting with 6 days of nausea, vomiting, diarrhea now resolved and with constipation, abdominal pain and distention. My clinical exam is concerning for obstruction versus appendicitis. Alternative diagnoses include influenza or influenza-like illness, food borne or viral GI illness. Diverticulitis is less likely given the right-sided pain. We'll plan to get a CT scan, blood work and initiate aggressive symptomatically treatment.  ----------------------------------------- 12:52 PM on 06/16/2015 -----------------------------------------  The patient had a single episode of vomiting prior to his CT scan. His pain was originally improved with Dilaudid, but now is returning. The CT scan shows a small bowel obstruction with a possible transition  point in the right lower quadrant but it transition point is unclear. I have talked to Dr. Adonis Huguenin the general surgeon on call who will come evaluate the patient and reviewed the CT scan as well. The patient is nothing by mouth at this time and if he begins to vomit again we will consider an NG tube. Plan admission.  ____________________________________________  FINAL CLINICAL IMPRESSION(S) / ED DIAGNOSES  Final diagnoses:  Small bowel obstruction ([place])  Hyponatremia  Renal insufficiency      NEW MEDICATIONS STARTED DURING THIS VISIT:  New Prescriptions   No medications on file     Eula Listen, MD 06/16/15 1036  Eula Listen, MD 06/16/15 1253

## 2015-06-16 NOTE — Progress Notes (Signed)
Pt stated he went to throw up, reached for his emesis bag and accidentally pulled out his NG. Pt received bath and will re-insert NG. Pt still feels nauseated and wants to throw up.

## 2015-06-16 NOTE — Progress Notes (Signed)
Re-inserted NG tube. Went to place XR order for verification and realized an XR was ordered for tomorrow morning. Dr. Dahlia Byes notified of what happened and asked him if we could do tomorrow morning's XR early instead of having two done. MD gave order to go ahead and perform XR now for follow up on SBO and for NG tube verification.

## 2015-06-16 NOTE — ED Notes (Signed)
Patient presents to the ED with abdominal pain since Tuesday, with vomiting and diarrhea.  Patient reports vomiting twice in the past 24 hours but states prior to that he was vomiting constantly.  Patient reports several episodes of diarrhea on Thursday and Friday, none since Friday.  Patient now feels constipated.  Patient reports lower abdominal pain is worse than upper abdominal pain.

## 2015-06-17 LAB — GLUCOSE, CAPILLARY
GLUCOSE-CAPILLARY: 121 mg/dL — AB (ref 65–99)
Glucose-Capillary: 142 mg/dL — ABNORMAL HIGH (ref 65–99)
Glucose-Capillary: 137 mg/dL — ABNORMAL HIGH (ref 65–99)
Glucose-Capillary: 114 mg/dL — ABNORMAL HIGH (ref 65–99)

## 2015-06-17 LAB — CBC
HCT: 40.1 % (ref 40.0–52.0)
Hemoglobin: 13.6 g/dL (ref 13.0–18.0)
MCH: 30.6 pg (ref 26.0–34.0)
MCHC: 33.9 g/dL (ref 32.0–36.0)
MCV: 90.1 fL (ref 80.0–100.0)
PLATELETS: 264 10*3/uL (ref 150–440)
RBC: 4.45 MIL/uL (ref 4.40–5.90)
RDW: 14 % (ref 11.5–14.5)
WBC: 6.1 10*3/uL (ref 3.8–10.6)

## 2015-06-17 LAB — PHOSPHORUS: Phosphorus: 2.5 mg/dL (ref 2.5–4.6)

## 2015-06-17 LAB — BASIC METABOLIC PANEL
Anion gap: 8 (ref 5–15)
BUN: 49 mg/dL — AB (ref 6–20)
CO2: 29 mmol/L (ref 22–32)
CREATININE: 1.13 mg/dL (ref 0.61–1.24)
Calcium: 9 mg/dL (ref 8.9–10.3)
Chloride: 98 mmol/L — ABNORMAL LOW (ref 101–111)
GFR calc Af Amer: >60 mL/min (ref >60)
Glucose, Bld: 142 mg/dL — ABNORMAL HIGH (ref 65–99)
POTASSIUM: 3.4 mmol/L — AB (ref 3.5–5.1)
SODIUM: 135 mmol/L (ref 135–145)

## 2015-06-17 LAB — PROTIME-INR
INR: 1.1
Prothrombin Time: 14.4 seconds (ref 11.4–15.0)

## 2015-06-17 LAB — MAGNESIUM: MAGNESIUM: 2.6 mg/dL — AB (ref 1.7–2.4)

## 2015-06-17 LAB — APTT: APTT: 31 s (ref 24–36)

## 2015-06-17 MED ORDER — INSULIN ASPART 100 UNIT/ML ~~LOC~~ SOLN
0.0000 [IU] | Freq: Four times a day (QID) | SUBCUTANEOUS | Status: DC
Start: 2015-06-17 — End: 2015-06-20
  Administered 2015-06-17 – 2015-06-20 (×5): 3 [IU] via SUBCUTANEOUS
  Filled 2015-06-17 (×5): qty 3

## 2015-06-17 NOTE — Plan of Care (Signed)
Problem: Physical Regulation: Goal: Ability to maintain clinical measurements within normal limits will improve Outcome: Completed/Met Date Met:  06/17/15 Pt has ambulated today with no assistance.   Problem: Bowel/Gastric: Goal: Will not experience complications related to bowel motility Outcome: Progressing Pt has passed gas. NG still in place.

## 2015-06-17 NOTE — Progress Notes (Signed)
75 yr old male with small bowel obstruction verses ileus after concern for gastroenteritis for the past 6 days.  Patient states feels much better since NG tube removed.  He states that his abdomen is less distended.  He says he has been passing flatus as well.   Filed Vitals:   06/17/15 0942 06/17/15 1353  BP: 140/60 148/63  Pulse: 77 80  Temp: 98.1 F (36.7 C) 98.4 F (36.9 C)  Resp: 18 18   I/O last 3 completed shifts: In: 759 [I.V.:759] Out: 1530 [Emesis/NG output:1530] Total I/O In: 1531 [I.V.:1531] Out: 1600 [Urine:500; Emesis/NG output:1100]   PE:  Gen: NAD  Res: CTAB/L  Cardio: RRR Abd: soft, hypoactive bowel sounds, mildly distended, non-tender Ext: 2+pulses, no edema  CBC Latest Ref Rng 06/17/2015 06/16/2015  WBC 3.8 - 10.6 K/uL 6.1 7.5  Hemoglobin 13.0 - 18.0 g/dL 13.6 14.6  Hematocrit 40.0 - 52.0 % 40.1 42.6  Platelets 150 - 440 K/uL 264 283    CMP Latest Ref Rng 06/17/2015 06/16/2015  Glucose 65 - 99 mg/dL 142(H) 193(H)  BUN 6 - 20 mg/dL 49(H) 67(H)  Creatinine 0.61 - 1.24 mg/dL 1.13 1.55(H)  Sodium 135 - 145 mmol/L 135 129(L)  Potassium 3.5 - 5.1 mmol/L 3.4(L) 3.6  Chloride 101 - 111 mmol/L 98(L) 89(L)  CO2 22 - 32 mmol/L 29 25  Calcium 8.9 - 10.3 mg/dL 9.0 9.5  Total Protein 6.5 - 8.1 g/dL - 8.2(H)  Total Bilirubin 0.3 - 1.2 mg/dL - 1.0  Alkaline Phos 38 - 126 U/L - 81  AST 15 - 41 U/L - 53(H)  ALT 17 - 63 U/L - 86(H)    A/P: 75 yr old male with small bowel obstruction verses ileus after concern for gastroenteritis for the past 6 days Continue NG Tube, IV fluids Encouraged ambulation

## 2015-06-18 ENCOUNTER — Inpatient Hospital Stay: Payer: PPO

## 2015-06-18 DIAGNOSIS — N289 Disorder of kidney and ureter, unspecified: Secondary | ICD-10-CM | POA: Diagnosis not present

## 2015-06-18 DIAGNOSIS — K5660 Unspecified intestinal obstruction: Secondary | ICD-10-CM | POA: Diagnosis not present

## 2015-06-18 DIAGNOSIS — E871 Hypo-osmolality and hyponatremia: Secondary | ICD-10-CM | POA: Insufficient documentation

## 2015-06-18 DIAGNOSIS — K5669 Other intestinal obstruction: Secondary | ICD-10-CM | POA: Diagnosis not present

## 2015-06-18 LAB — GLUCOSE, CAPILLARY
GLUCOSE-CAPILLARY: 125 mg/dL — AB (ref 65–99)
Glucose-Capillary: 117 mg/dL — ABNORMAL HIGH (ref 65–99)
Glucose-Capillary: 104 mg/dL — ABNORMAL HIGH (ref 65–99)

## 2015-06-18 NOTE — Progress Notes (Signed)
MD paged regarding pt status. MD stated we could not try ice chips and clamping the NG tube. Will continue to monitor.

## 2015-06-18 NOTE — Progress Notes (Signed)
75 yr old male with small bowel obstruction verses ileus after concern for gastroenteritis for the past 7 days.  Patient states feeling better today.  He says he has been up and passing some gas and even had a small amount of stool  He does say that he still feels bloated today.  He has been up and walking in hallway.   Filed Vitals:   06/18/15 0546 06/18/15 1308  BP: 139/72 143/70  Pulse: 69 70  Temp: 98 F (36.7 C) 98.1 F (36.7 C)  Resp: 18 18   I/O last 3 completed shifts: In: [id number] [I.V.:4391] Out: 4205 [Urine:1525; Emesis/NG [id number] Total I/O In: 959 [I.V.:959] Out: 725 [Urine:325; Emesis/NG output:400]   PE:  Gen: NAD  Res: CTAB/L  Cardio: RRR Abd: soft,bowel sounds normal, mildly distended, non-tender Ext: 2+pulses, no edema  CBC Latest Ref Rng 06/17/2015 06/16/2015  WBC 3.8 - 10.6 K/uL 6.1 7.5  Hemoglobin 13.0 - 18.0 g/dL 13.6 14.6  Hematocrit 40.0 - 52.0 % 40.1 42.6  Platelets 150 - 440 K/uL 264 283    CMP Latest Ref Rng 06/17/2015 06/16/2015  Glucose 65 - 99 mg/dL 142(H) 193(H)  BUN 6 - 20 mg/dL 49(H) 67(H)  Creatinine 0.61 - 1.24 mg/dL 1.13 1.55(H)  Sodium 135 - 145 mmol/L 135 129(L)  Potassium 3.5 - 5.1 mmol/L 3.4(L) 3.6  Chloride 101 - 111 mmol/L 98(L) 89(L)  CO2 22 - 32 mmol/L 29 25  Calcium 8.9 - 10.3 mg/dL 9.0 9.5  Total Protein 6.5 - 8.1 g/dL - 8.2(H)  Total Bilirubin 0.3 - 1.2 mg/dL - 1.0  Alkaline Phos 38 - 126 U/L - 81  AST 15 - 41 U/L - 53(H)  ALT 17 - 63 U/L - 86(H)    A/P: 75 yr old male with small bowel obstruction verses ileus after concern for gastroenteritis  Continue NG Tube since 2L out since yesterday and  IV fluids, will give ice chips for comfort Encouraged ambulation

## 2015-06-19 ENCOUNTER — Inpatient Hospital Stay: Payer: PPO

## 2015-06-19 DIAGNOSIS — K5669 Other intestinal obstruction: Secondary | ICD-10-CM | POA: Diagnosis not present

## 2015-06-19 DIAGNOSIS — K5660 Unspecified intestinal obstruction: Secondary | ICD-10-CM | POA: Diagnosis not present

## 2015-06-19 LAB — GLUCOSE, CAPILLARY
GLUCOSE-CAPILLARY: 112 mg/dL — AB (ref 65–99)
GLUCOSE-CAPILLARY: 114 mg/dL — AB (ref 65–99)
Glucose-Capillary: 113 mg/dL — ABNORMAL HIGH (ref 65–99)
Glucose-Capillary: 121 mg/dL — ABNORMAL HIGH (ref 65–99)
Glucose-Capillary: 130 mg/dL — ABNORMAL HIGH (ref 65–99)
Glucose-Capillary: 136 mg/dL — ABNORMAL HIGH (ref 65–99)

## 2015-06-19 MED ORDER — LISINOPRIL 10 MG PO TABS
10.0000 mg | ORAL_TABLET | Freq: Every day | ORAL | Status: DC
  Administered 2015-06-19 – 2015-06-20 (×2): 10 mg via ORAL
  Filled 2015-06-19 (×2): qty 1

## 2015-06-19 MED ORDER — HYDROCHLOROTHIAZIDE 12.5 MG PO CAPS
12.5000 mg | ORAL_CAPSULE | Freq: Every day | ORAL | Status: DC
  Administered 2015-06-19 – 2015-06-20 (×2): 12.5 mg via ORAL
  Filled 2015-06-19 (×2): qty 1

## 2015-06-19 NOTE — Progress Notes (Signed)
CC mall bowel obstruction Subjective: This patient admitted with a history of small bowel obstruction who is now passing some gas having no nausea vomiting and no abdominal pain. A KUB is been ordered but is pending.  Objective: Vital signs in last 24 hours: Temp:  [98.1 F (36.7 C)-98.6 F (37 C)] 98.2 F (36.8 C) (03/29 [id number]) Pulse Rate:  [67-70] 67 (03/29 0633) Resp:  [16-20] 20 (03/29 [id number]) BP: (143-157)/(70-77) 152/72 mmHg (03/29 0633) SpO2:  [96 %-98 %] 97 % (03/29 0633) Last BM Date: 06/18/15  Intake/Output from previous day: 03/28 0701 - 03/29 0700 In: 3035 [I.V.:2985; NG/GT:50] Out: 1825 [Urine:925; Emesis/NG output:900] Intake/Output this shift: Total I/O In: 259 [I.V.:259] Out: 50 [Emesis/NG output:50]  Physical exam:  Abdomen is distended minimally tympanitic and nontender scars are noted calves are nontender wake alert and oriented  Lab Results: CBC   Recent Labs  06/17/15 0503  WBC 6.1  HGB 13.6  HCT 40.1  PLT 264   BMET  Recent Labs  06/17/15 0503  NA 135  K 3.4*  CL 98*  CO2 29  GLUCOSE 142*  BUN 49*  CREATININE 1.13  CALCIUM 9.0   PT/INR  Recent Labs  06/17/15 0503  LABPROT 14.4  INR 1.10   ABG No results for input(s): PHART, HCO3 in the last 72 hours.  Invalid input(s): PCO2, PO2  Studies/Results: Dg Abd Portable 2v  06/18/2015  CLINICAL DATA:  Small-bowel obstruction. EXAM: PORTABLE ABDOMEN - 2 VIEW COMPARISON:  06/16/2015. FINDINGS: NG tube noted with tip projected over the stomach. Previously identified bowel distention has significantly improved new small bowel distention remains. No free air. No acute bony abnormality. IMPRESSION: 1. NG tube noted in stable position. 2. Significant improvement of bowel distention. Small bowel distention however does remain. Continued follow-up exam suggested . Electronically Signed   By: Marcello Moores  Register   On: 06/18/2015 07:27    Anti-infectives: Anti-infectives    None       Assessment/Plan:  KUB is pending at this point and I will review those films personally and decide upon removing a nasogastric tube based on those films in his clinical picture with him passing some gas.  Florene Glen, MD, FACS  06/19/2015

## 2015-06-19 NOTE — Care Management Important Message (Signed)
Important Message  Patient Details  Name: Zachary Robertson MRN: [id number] Date of Birth: [birth year]   Medicare Important Message Given:  Yes    Beverly Sessions, RN 06/19/2015, 10:52 AM

## 2015-06-20 DIAGNOSIS — K5669 Other intestinal obstruction: Secondary | ICD-10-CM | POA: Diagnosis not present

## 2015-06-20 LAB — CREATININE, SERUM
CREATININE: 0.88 mg/dL (ref 0.61–1.24)
GFR calc Af Amer: >60 mL/min (ref >60)
GFR calc non Af Amer: >60 mL/min (ref >60)

## 2015-06-20 LAB — CBC
HEMATOCRIT: 37.8 % — AB (ref 40.0–52.0)
HEMOGLOBIN: 12.9 g/dL — AB (ref 13.0–18.0)
MCH: 30.4 pg (ref 26.0–34.0)
MCHC: 34 g/dL (ref 32.0–36.0)
MCV: 89.2 fL (ref 80.0–100.0)
PLATELETS: 258 10*3/uL (ref 150–440)
RBC: 4.23 MIL/uL — AB (ref 4.40–5.90)
RDW: 13.6 % (ref 11.5–14.5)
WBC: 10.2 10*3/uL (ref 3.8–10.6)

## 2015-06-20 LAB — GLUCOSE, CAPILLARY: Glucose-Capillary: 113 mg/dL — ABNORMAL HIGH (ref 65–99)

## 2015-06-20 NOTE — Discharge Summary (Signed)
Physician Discharge Summary  Patient ID: Zachary Robertson MRN: [id number] DOB/AGE: 75-Jun-1942 70 y.o.  Admit date: 06/16/2015 Discharge date: 06/20/2015   Discharge Diagnoses:  Active Problems:   Small bowel obstruction (HCC)   Hyponatremia   Renal insufficiency   Procedures:None  Hospital Course: This patient admitted to the hospital diagnosis of partial small bowel obstruction and nasogastric tube was placed and he was treated conservatively ultimately started passing gas and nasogastric tube was removed clear liquid diet was instituted he tolerated a clear liquid diet and is being advanced to a full liquid diet at this point and if he tolerates this she will be discharged home to follow-up on an as-needed basis no surgical plans needed at this time he is counseled concerning the risk of recurrence and the potential for follow-up if needed.  Consults: None  Disposition: Final discharge disposition not confirmed     Medication List    TAKE these medications        aspirin EC 81 MG tablet  Take 81 mg by mouth daily.     finasteride 5 MG tablet  Commonly known as:  PROSCAR  Take 1 tablet (5 mg total) by mouth daily.     Fish Oil 1200 MG Caps  Take 1,200-2,400 mg by mouth See admin instructions. Take 2 capsules (2400mg ) by mouth every morning and 1 capsule by mouth in the evening.     ibuprofen 200 MG tablet  Commonly known as:  ADVIL,MOTRIN  Take 600-800 mg by mouth every 6 (six) hours as needed for fever, headache, mild pain or moderate pain.     latanoprost 0.005 % ophthalmic solution  Commonly known as:  XALATAN  Place 1 drop into both eyes every morning.     lisinopril-hydrochlorothiazide 10-12.5 MG tablet  Commonly known as:  PRINZIDE,ZESTORETIC  Take 1 tablet by mouth daily.     shark liver oil-cocoa butter 0.25-3-85.5 % suppository  Commonly known as:  PREPARATION H  Place 1 suppository rectally daily as needed for hemorrhoids.     timolol 0.5 % ophthalmic  solution  Commonly known as:  TIMOPTIC  Apply 1 drop to eye every morning.         Florene Glen, MD, FACS

## 2015-06-20 NOTE — Discharge Instructions (Signed)
Resume normal activities and all home medications Advance diet from full liquids to regular diet slowly over the next 2 days Return to the emergency room should he experience pain or nausea or vomiting a return of the symptoms of bowel obstruction

## 2015-06-20 NOTE — Progress Notes (Signed)
06/20/2015 12:31 PM  BP 144/72 mmHg  Pulse 67  Temp(Src) 98.5 F (36.9 C) (Oral)  Resp 18  Ht 5\' 10"  (1.778 m)  Wt 113.172 kg (249 lb 8 oz)  BMI 35.80 kg/m2  SpO2 98% Patient discharged per MD orders. Discharge instructions reviewed with patient and patient verbalized understanding. IV's removed per policy.  Discharged via wheelchair escorted by volunteers.  Almedia Balls, RN

## 2015-06-20 NOTE — Progress Notes (Signed)
CC: Small bowel obstruction Subjective: This patient admitted with a partial small bowel obstruction who has had his nasogastric tube removed yesterday and is tolerating clear liquid diet he is passing gas and having bowel movements has no other problems no nausea vomiting no fevers or chills and no abdominal pain at this point he wants to advance his diet and go home.  Objective: Vital signs in last 24 hours: Temp:  [98 F (36.7 C)-99.1 F (37.3 C)] 98.5 F (36.9 C) (03/30 0528) Pulse Rate:  [67-76] 67 (03/30 0528) Resp:  [17-21] 18 (03/30 0528) BP: (144-156)/(69-83) 144/72 mmHg (03/30 0528) SpO2:  [98 %-100 %] 98 % (03/30 0528) Last BM Date: Jul 10, 2015  Intake/Output from previous day: 07/10/2022 0701 - 03/30 0700 In: 3649 [P.O.:750; I.V.:2899] Out: 1150 [Urine:1000; Emesis/NG output:150] Intake/Output this shift:    Physical exam:  Abdomen is soft and minimally distended minimally tympanitic and nontender he states this is back to his normal state. Abs are nontender awake alert and oriented  Lab Results: CBC   Recent Labs  06/20/15 0431  WBC 10.2  HGB 12.9*  HCT 37.8*  PLT 258   BMET  Recent Labs  06/20/15 0431  CREATININE 0.88   PT/INR No results for input(s): LABPROT, INR in the last 72 hours. ABG No results for input(s): PHART, HCO3 in the last 72 hours.  Invalid input(s): PCO2, PO2  Studies/Results: Dg Abd 2 Views  10-Jul-2015  CLINICAL DATA:  Small bowel obstruction EXAM: ABDOMEN - 2 VIEW COMPARISON:  Yesterday FINDINGS: Nasogastric tube tip overlaps the mid stomach. Loops of small bowel in the left abdomen are dilated at 38 mm but distention is decreased from yesterday and especially from admission CT. Distal colonic diverticulosis. IMPRESSION: Continued improvement in small bowel distention. Near normal bowel gas pattern. Electronically Signed   By: Monte Fantasia M.D.   On: 2015/07/10 12:00    Anti-infectives: Anti-infectives    None       Assessment/Plan:  Patient's small bowel obstruction apparently has resolved he is passing gas and is tolerating clear liquid diet at this point we'll advance diet and likely discharge later today  Florene Glen, MD, FACS  06/20/2015

## 2015-07-03 DIAGNOSIS — D649 Anemia, unspecified: Secondary | ICD-10-CM | POA: Diagnosis not present

## 2015-07-03 DIAGNOSIS — Z8719 Personal history of other diseases of the digestive system: Secondary | ICD-10-CM | POA: Diagnosis not present

## 2015-07-03 DIAGNOSIS — R945 Abnormal results of liver function studies: Secondary | ICD-10-CM | POA: Diagnosis not present

## 2015-07-03 DIAGNOSIS — E782 Mixed hyperlipidemia: Secondary | ICD-10-CM | POA: Diagnosis not present

## 2015-07-03 DIAGNOSIS — N289 Disorder of kidney and ureter, unspecified: Secondary | ICD-10-CM | POA: Diagnosis not present

## 2015-07-03 DIAGNOSIS — R7302 Impaired glucose tolerance (oral): Secondary | ICD-10-CM | POA: Diagnosis not present

## 2015-07-03 DIAGNOSIS — I10 Essential (primary) hypertension: Secondary | ICD-10-CM | POA: Diagnosis not present

## 2015-08-13 DIAGNOSIS — R7302 Impaired glucose tolerance (oral): Secondary | ICD-10-CM | POA: Diagnosis not present

## 2015-08-13 DIAGNOSIS — I10 Essential (primary) hypertension: Secondary | ICD-10-CM | POA: Diagnosis not present

## 2015-08-13 DIAGNOSIS — N400 Enlarged prostate without lower urinary tract symptoms: Secondary | ICD-10-CM | POA: Diagnosis not present

## 2015-08-13 DIAGNOSIS — Z79899 Other long term (current) drug therapy: Secondary | ICD-10-CM | POA: Diagnosis not present

## 2015-08-13 DIAGNOSIS — E782 Mixed hyperlipidemia: Secondary | ICD-10-CM | POA: Diagnosis not present

## 2015-08-13 DIAGNOSIS — R1031 Right lower quadrant pain: Secondary | ICD-10-CM | POA: Diagnosis not present

## 2015-08-15 DIAGNOSIS — H401131 Primary open-angle glaucoma, bilateral, mild stage: Secondary | ICD-10-CM | POA: Diagnosis not present

## 2015-09-04 DIAGNOSIS — A084 Viral intestinal infection, unspecified: Secondary | ICD-10-CM | POA: Diagnosis not present

## 2015-09-16 ENCOUNTER — Telehealth: Payer: Self-pay | Admitting: General Surgery

## 2015-09-16 ENCOUNTER — Other Ambulatory Visit: Payer: Self-pay | Admitting: Gastroenterology

## 2015-09-16 ENCOUNTER — Inpatient Hospital Stay: Payer: PPO

## 2015-09-16 ENCOUNTER — Inpatient Hospital Stay
Admission: RE | Admit: 2015-09-16 | Discharge: 2015-09-23 | DRG: 820 | Disposition: A | Discharge status: Home / Self Care | Payer: PPO | Source: Ambulatory Visit | Attending: Internal Medicine | Admitting: Internal Medicine

## 2015-09-16 ENCOUNTER — Ambulatory Visit
Admission: RE | Admit: 2015-09-16 | Discharge: 2015-09-16 | Disposition: A | Discharge status: Home / Self Care | Payer: PPO | Source: Ambulatory Visit | Attending: Gastroenterology | Admitting: Gastroenterology

## 2015-09-16 ENCOUNTER — Inpatient Hospital Stay: Admission: RE | Admit: 2015-09-16 | Payer: PPO | Source: Ambulatory Visit | Admitting: Internal Medicine

## 2015-09-16 DIAGNOSIS — Z8042 Family history of malignant neoplasm of prostate: Secondary | ICD-10-CM | POA: Diagnosis not present

## 2015-09-16 DIAGNOSIS — E785 Hyperlipidemia, unspecified: Secondary | ICD-10-CM | POA: Diagnosis present

## 2015-09-16 DIAGNOSIS — R112 Nausea with vomiting, unspecified: Secondary | ICD-10-CM

## 2015-09-16 DIAGNOSIS — Z4659 Encounter for fitting and adjustment of other gastrointestinal appliance and device: Secondary | ICD-10-CM

## 2015-09-16 DIAGNOSIS — C8333 Diffuse large B-cell lymphoma, intra-abdominal lymph nodes: Secondary | ICD-10-CM | POA: Diagnosis not present

## 2015-09-16 DIAGNOSIS — R1084 Generalized abdominal pain: Secondary | ICD-10-CM

## 2015-09-16 DIAGNOSIS — E669 Obesity, unspecified: Secondary | ICD-10-CM | POA: Diagnosis not present

## 2015-09-16 DIAGNOSIS — R1900 Intra-abdominal and pelvic swelling, mass and lump, unspecified site: Secondary | ICD-10-CM | POA: Diagnosis present

## 2015-09-16 DIAGNOSIS — Z87891 Personal history of nicotine dependence: Secondary | ICD-10-CM | POA: Diagnosis not present

## 2015-09-16 DIAGNOSIS — Z9889 Other specified postprocedural states: Secondary | ICD-10-CM | POA: Diagnosis not present

## 2015-09-16 DIAGNOSIS — D291 Benign neoplasm of prostate: Secondary | ICD-10-CM | POA: Diagnosis not present

## 2015-09-16 DIAGNOSIS — Z6832 Body mass index (BMI) 32.0-32.9, adult: Secondary | ICD-10-CM | POA: Diagnosis not present

## 2015-09-16 DIAGNOSIS — K219 Gastro-esophageal reflux disease without esophagitis: Secondary | ICD-10-CM | POA: Diagnosis present

## 2015-09-16 DIAGNOSIS — E43 Unspecified severe protein-calorie malnutrition: Secondary | ICD-10-CM | POA: Diagnosis present

## 2015-09-16 DIAGNOSIS — I10 Essential (primary) hypertension: Secondary | ICD-10-CM | POA: Diagnosis not present

## 2015-09-16 DIAGNOSIS — I700 Atherosclerosis of aorta: Secondary | ICD-10-CM | POA: Insufficient documentation

## 2015-09-16 DIAGNOSIS — H409 Unspecified glaucoma: Secondary | ICD-10-CM | POA: Diagnosis not present

## 2015-09-16 DIAGNOSIS — K5660 Unspecified intestinal obstruction: Secondary | ICD-10-CM

## 2015-09-16 DIAGNOSIS — H5350 Unspecified color vision deficiencies: Secondary | ICD-10-CM | POA: Diagnosis present

## 2015-09-16 DIAGNOSIS — R040 Epistaxis: Secondary | ICD-10-CM | POA: Diagnosis not present

## 2015-09-16 DIAGNOSIS — N400 Enlarged prostate without lower urinary tract symptoms: Secondary | ICD-10-CM | POA: Diagnosis present

## 2015-09-16 DIAGNOSIS — K56609 Unspecified intestinal obstruction, unspecified as to partial versus complete obstruction: Secondary | ICD-10-CM

## 2015-09-16 DIAGNOSIS — K660 Peritoneal adhesions (postprocedural) (postinfection): Secondary | ICD-10-CM | POA: Diagnosis not present

## 2015-09-16 DIAGNOSIS — Z79899 Other long term (current) drug therapy: Secondary | ICD-10-CM | POA: Diagnosis not present

## 2015-09-16 DIAGNOSIS — Z7982 Long term (current) use of aspirin: Secondary | ICD-10-CM

## 2015-09-16 DIAGNOSIS — R197 Diarrhea, unspecified: Secondary | ICD-10-CM | POA: Insufficient documentation

## 2015-09-16 DIAGNOSIS — D379 Neoplasm of uncertain behavior of digestive organ, unspecified: Secondary | ICD-10-CM | POA: Diagnosis not present

## 2015-09-16 DIAGNOSIS — K5669 Other intestinal obstruction: Secondary | ICD-10-CM | POA: Diagnosis not present

## 2015-09-16 LAB — CBC
HEMATOCRIT: 36.6 % — AB (ref 40.0–52.0)
Hemoglobin: 13.1 g/dL (ref 13.0–18.0)
MCH: 31.1 pg (ref 26.0–34.0)
MCHC: 35.6 g/dL (ref 32.0–36.0)
MCV: 87.2 fL (ref 80.0–100.0)
Platelets: 223 10*3/uL (ref 150–440)
RBC: 4.2 MIL/uL — AB (ref 4.40–5.90)
RDW: 14.5 % (ref 11.5–14.5)
WBC: 8.8 10*3/uL (ref 3.8–10.6)

## 2015-09-16 LAB — COMPREHENSIVE METABOLIC PANEL
ALT: 27 U/L (ref 17–63)
AST: 24 U/L (ref 15–41)
Albumin: 4.1 g/dL (ref 3.5–5.0)
Alkaline Phosphatase: 88 U/L (ref 38–126)
Anion gap: 10 (ref 5–15)
BUN: 25 mg/dL — AB (ref 6–20)
CHLORIDE: 96 mmol/L — AB (ref 101–111)
CO2: 27 mmol/L (ref 22–32)
CREATININE: 1.1 mg/dL (ref 0.61–1.24)
Calcium: 9.7 mg/dL (ref 8.9–10.3)
GFR calc Af Amer: >60 mL/min (ref >60)
Glucose, Bld: 111 mg/dL — ABNORMAL HIGH (ref 65–99)
Potassium: 3.1 mmol/L — ABNORMAL LOW (ref 3.5–5.1)
Sodium: 133 mmol/L — ABNORMAL LOW (ref 135–145)
TOTAL PROTEIN: 7.8 g/dL (ref 6.5–8.1)
Total Bilirubin: 0.6 mg/dL (ref 0.3–1.2)

## 2015-09-16 LAB — C-REACTIVE PROTEIN: CRP: 0.7 mg/dL (ref <1.0)

## 2015-09-16 LAB — LIPASE, BLOOD: LIPASE: 42 U/L (ref 11–51)

## 2015-09-16 MED ORDER — ENOXAPARIN SODIUM 40 MG/0.4ML ~~LOC~~ SOLN
40.0000 mg | SUBCUTANEOUS | Status: DC
  Administered 2015-09-16 – 2015-09-18 (×3): 40 mg via SUBCUTANEOUS
  Filled 2015-09-16 (×3): qty 0.4

## 2015-09-16 MED ORDER — IOPAMIDOL (ISOVUE-300) INJECTION 61%
100.0000 mL | Freq: Once | INTRAVENOUS | Status: AC | PRN
  Administered 2015-09-16: 100 mL via INTRAVENOUS

## 2015-09-16 MED ORDER — POTASSIUM CHLORIDE IN NACL 20-0.45 MEQ/L-% IV SOLN
INTRAVENOUS | Status: DC
  Administered 2015-09-16 – 2015-09-18 (×4): via INTRAVENOUS
  Filled 2015-09-16 (×7): qty 1000

## 2015-09-16 MED ORDER — ACETAMINOPHEN 650 MG RE SUPP: 650.0000 mg | Freq: Four times a day (QID) | RECTAL | Status: DC | PRN

## 2015-09-16 MED ORDER — MORPHINE SULFATE (PF) 2 MG/ML IV SOLN
2.0000 mg | INTRAVENOUS | Status: DC | PRN
  Administered 2015-09-18 – 2015-09-19 (×4): 2 mg via INTRAVENOUS
  Filled 2015-09-16 (×4): qty 1

## 2015-09-16 MED ORDER — METOPROLOL TARTRATE 5 MG/5ML IV SOLN: 5.0000 mg | INTRAVENOUS | Status: DC | PRN

## 2015-09-16 MED ORDER — LATANOPROST 0.005 % OP SOLN
1.0000 [drp] | OPHTHALMIC | Status: DC
  Administered 2015-09-17 – 2015-09-23 (×7): 1 [drp] via OPHTHALMIC
  Filled 2015-09-16: qty 2.5

## 2015-09-16 MED ORDER — TIMOLOL MALEATE 0.5 % OP SOLN
1.0000 [drp] | OPHTHALMIC | Status: DC
  Administered 2015-09-17 – 2015-09-23 (×7): 1 [drp] via OPHTHALMIC
  Filled 2015-09-16: qty 5

## 2015-09-16 MED ORDER — ONDANSETRON HCL 4 MG/2ML IJ SOLN
4.0000 mg | Freq: Four times a day (QID) | INTRAMUSCULAR | Status: DC | PRN
  Administered 2015-09-16: 4 mg via INTRAVENOUS
  Filled 2015-09-16: qty 2

## 2015-09-16 MED ORDER — ACETAMINOPHEN 325 MG PO TABS: 650.0000 mg | ORAL_TABLET | Freq: Four times a day (QID) | ORAL | Status: DC | PRN

## 2015-09-16 NOTE — Telephone Encounter (Signed)
Received a call from GI office about patient.  Recent history and images reviewed with provider over the phone. Discussed that this along with the patient can keep himself hydrated that he can continue as an outpatient. Should he fail to reveal to keep himself hydrated he needs to be admitted to the hospital. This can be provided via the emergency department after hours.  Surgery clinic will call patient in the morning to set up follow-up as soon as possible.  Clayburn Pert, MD FACS General Surgeon [facility]

## 2015-09-16 NOTE — H&P (Addendum)
PCP:   Ezequiel Kayser, MD   Chief Complaint:  Abdominal pain , nausea, vomiting  HPI: This is a 75 year old male who was admitted in March with high-grade distal small bowel obstruction. He states over the last month he's been having same symptoms on and off every few days. He gets bloated, developed nausea, vomiting and intermittent diarrhea. It lasts 2 -3 days, then resolves. He has generalized abdominal pain that is sharp, intermittent and severe. He was able to see GI today and a CT abdomen/pelvis was ordered. The CT abdomen revealed a distal small obstruction and a mass. He was sent in for direct admission. The patient has had colonoscopies in the past, most recent in 2013. At that time he had a small polyp removed. The patient states his lost 25-30 pounds over the past 4 months. His appetite has been spotty. His energy has been nonexistent and is also tired. He is color blind. He frequently refused to telemetry. Please had dark or bloody stools. History provided by the patient as well as his wife present at bedside.  Review of Systems:  The patient denies anorexia, fever, weight loss,, vision loss, decreased hearing, hoarseness, chest pain, syncope, dyspnea on exertion, peripheral edema, balance deficits, hemoptysis, abdominal pain, nausea, vomiting, melena, hematochezia, severe indigestion/heartburn, hematuria, incontinence, genital sores, muscle weakness, suspicious skin lesions, transient blindness, difficulty walking, depression, unusual weight change, abnormal bleeding, enlarged lymph nodes, angioedema, and breast masses.  Past Medical History: Past Medical History  Diagnosis Date  . GERD (gastroesophageal reflux disease)   . Nocturia   . Urgency-frequency syndrome   . Dribbling   . Hesitancy   . HTN (hypertension)   . BPH (benign prostatic hyperplasia)   . Obese   . ED (erectile dysfunction)   . Hypogonadism in male   . Essential (primary) hypertension 06/03/2015  . Acid reflux  06/03/2015  . Adenomatous colon polyp 08/12/2014    Overview:  On 05/25/2011 colonoscopy; repeat 05/2016 (Dr. Gustavo Lah)   . Glaucoma 08/07/2013   Past Surgical History  Procedure Laterality Date  . Hernia repair      Medications: Prior to Admission medications   Medication Sig Start Date End Date Taking? Authorizing Provider  aspirin EC 81 MG tablet Take 81 mg by mouth daily.     Historical Provider, MD  finasteride (PROSCAR) 5 MG tablet Take 1 tablet (5 mg total) by mouth daily. 06/03/15   Larene Beach A McGowan, PA-C  ibuprofen (ADVIL,MOTRIN) 200 MG tablet Take 600-800 mg by mouth every 6 (six) hours as needed for fever, headache, mild pain or moderate pain.     Historical Provider, MD  latanoprost (XALATAN) 0.005 % ophthalmic solution Place 1 drop into both eyes every morning.  04/24/15   Historical Provider, MD  lisinopril-hydrochlorothiazide (PRINZIDE,ZESTORETIC) 10-12.5 MG tablet Take 1 tablet by mouth daily.  08/13/14 08/13/15  Historical Provider, MD  Omega-3 Fatty Acids (FISH OIL) 1200 MG CAPS Take 1,200-2,400 mg by mouth See admin instructions. Take 2 capsules (2400mg ) by mouth every morning and 1 capsule by mouth in the evening.    Historical Provider, MD  shark liver oil-cocoa butter (PREPARATION H) 0.25-3-85.5 % suppository Place 1 suppository rectally daily as needed for hemorrhoids.    Historical Provider, MD  timolol (TIMOPTIC) 0.5 % ophthalmic solution Apply 1 drop to eye every morning.  05/12/13   Historical Provider, MD    Allergies:  No Known Allergies  Social History:  reports that he has quit smoking. He does not have any smokeless  tobacco history on file. He reports that he drinks about 1.2 oz of alcohol per week. He reports that he does not use illicit drugs.  Family History: Family History  Problem Relation Age of Onset  . Prostate cancer Father   . Cancer      family members in general  . Bladder Cancer Neg Hx   . Kidney cancer Neg Hx     Physical Exam: There were no  vitals filed for this visit.  General:  Alert and oriented times three, well developed and nourished, no acute distress Eyes: PERRLA, pink conjunctiva, no scleral icterus ENT: Moist oral mucosa, neck supple, no thyromegaly Lungs: clear to ascultation, no wheeze, no crackles, no use of accessory muscles Cardiovascular: regular rate and rhythm, no regurgitation, no gallops, no murmurs. No carotid bruits, no JVD Abdomen: soft, no BS, positive generalized TTP, distended, not an acute abdomen GU: not examined Neuro: CN II - XII grossly intact, sensation intact Musculoskeletal: strength 5/5 all extremities, no clubbing, cyanosis or edema Skin: no rash, no subcutaneous crepitation, no decubitus Psych: appropriate patient   Labs on Admission:   Recent Labs  09/16/15 1615  NA 133*  K 3.1*  CL 96*  CO2 27  GLUCOSE 111*  BUN 25*  CREATININE 1.10  CALCIUM 9.7    Recent Labs  09/16/15 1615  AST 24  ALT 27  ALKPHOS 88  BILITOT 0.6  PROT 7.8  ALBUMIN 4.1    Recent Labs  09/16/15 1615  LIPASE 42    Recent Labs  09/16/15 1615  WBC 8.8  HGB 13.1  HCT 36.6*  MCV 87.2  PLT 223   No results for input(s): CKTOTAL, CKMB, CKMBINDEX, TROPONINI in the last 72 hours. Invalid input(s): POCBNP No results for input(s): DDIMER in the last 72 hours. No results for input(s): HGBA1C in the last 72 hours. No results for input(s): CHOL, HDL, LDLCALC, TRIG, CHOLHDL, LDLDIRECT in the last 72 hours. No results for input(s): TSH, T4TOTAL, T3FREE, THYROIDAB in the last 72 hours.  Invalid input(s): FREET3 No results for input(s): VITAMINB12, FOLATE, FERRITIN, TIBC, IRON, RETICCTPCT in the last 72 hours.  Micro Results: No results found for this or any previous visit (from the past 240 hour(s)).   Radiological Exams on Admission: Ct Abdomen Pelvis W Contrast  09/16/2015  CLINICAL DATA:  Generalized abdominal pain, vomiting and diarrhea for 1 month. History of hernia repair. EXAM: CT  ABDOMEN AND PELVIS WITH CONTRAST TECHNIQUE: Multidetector CT imaging of the abdomen and pelvis was performed using the standard protocol following bolus administration of intravenous contrast. CONTRAST:  14mL ISOVUE-300 IOPAMIDOL (ISOVUE-300) INJECTION 61% COMPARISON:  06/19/2015 abdominal radiographs and 06/16/2015 CT abdomen/pelvis. FINDINGS: Motion degraded scan. Lower chest: No significant pulmonary nodules or acute consolidative airspace disease. Hepatobiliary: Normal liver with no liver mass. Normal gallbladder with no radiopaque cholelithiasis. No biliary ductal dilatation. Pancreas: Normal, with no mass or duct dilation. Spleen: Normal size. No mass. Adrenals/Urinary Tract: Normal adrenals. Simple 1.2 cm medial upper right renal cyst. Simple 3.0 cm upper left renal cyst. No hydronephrosis. Collapsed and grossly normal bladder. Stomach/Bowel: Grossly normal stomach. There are prominently dilated (up to 5.0 cm diameter) fluid-filled proximal to mid small bowel loops throughout the abdomen. The distal and terminal ileum is collapsed. There is a focal caliber transition in the distal small bowel in the right abdomen at the site of a 4.9 x 4.3 cm solid small bowel mass (series 2/ image 53). There is stool sign within the small  bowel proximal to the small bowel mass. Collapsed large bowel. Mild sigmoid diverticulosis. No large bowel wall thickening or pericolonic fat stranding. Vascular/Lymphatic: Atherosclerotic nonaneurysmal abdominal aorta. Patent portal, splenic, hepatic and renal veins. There are several mildly enlarged lymph nodes in the right mesentery adjacent to the small bowel mass, largest 1.4 cm (series 2/ image 58). No additional pathologically enlarged abdominopelvic nodes. Reproductive: Top-normal size prostate. Other: No pneumoperitoneum, ascites or focal fluid collection. Musculoskeletal: No aggressive appearing focal osseous lesions. Marked thoracolumbar spondylosis. IMPRESSION: 1. Distal small  bowel obstruction due to a distal small bowel 4.9 x 4.3 cm mass in the right abdomen with adjacent mesenteric adenopathy. Considerations include lymphoma, carcinoid, metastasis or adenocarcinoma. Surgical consultation advised. 2. Aortic atherosclerosis. These results were called by telephone at the time of interpretation on 09/16/2015 at 5:55 pm to NP [facility] , who verbally acknowledged these results. Electronically Signed   By: Ilona Sorrel M.D.   On: 09/16/2015 17:57    Assessment/Plan Present on Admission:  . SBO (small bowel obstruction) (HCC) -Admit to MedSurg -Nothing by mouth, IV fluid hydration -Pain medication as needed, antiemetics -NG tube to low wall suction -Consult surgery in the a.m.  -discuss with surgeon in a.m. the best timing for biopsy. Likely after obstruction is decompressed and is less bowel dilation.  4.9 x 4.3 cm abdominal mass  -Consult. IR for biopsy if needed -Consult surgery in the a.m. recent small bowel obstruction &/or Bx -CEA, PSA levels ordered  . Benign fibroma of prostate -aware, medication held   . Essential (primary) hypertension -aware, medication held   -Lopressor ordered when necessary  . HLD (hyperlipidemia) -Aware, medication held   [place], Kimberly Nieland 09/16/2015, 8:37 PM

## 2015-09-17 ENCOUNTER — Encounter: Payer: Self-pay | Admitting: *Deleted

## 2015-09-17 ENCOUNTER — Inpatient Hospital Stay: Payer: PPO

## 2015-09-17 ENCOUNTER — Telehealth: Payer: Self-pay

## 2015-09-17 DIAGNOSIS — E43 Unspecified severe protein-calorie malnutrition: Secondary | ICD-10-CM | POA: Diagnosis present

## 2015-09-17 DIAGNOSIS — N400 Enlarged prostate without lower urinary tract symptoms: Secondary | ICD-10-CM | POA: Diagnosis not present

## 2015-09-17 DIAGNOSIS — Z4682 Encounter for fitting and adjustment of non-vascular catheter: Secondary | ICD-10-CM | POA: Diagnosis not present

## 2015-09-17 DIAGNOSIS — D379 Neoplasm of uncertain behavior of digestive organ, unspecified: Secondary | ICD-10-CM | POA: Diagnosis not present

## 2015-09-17 DIAGNOSIS — I10 Essential (primary) hypertension: Secondary | ICD-10-CM | POA: Diagnosis not present

## 2015-09-17 DIAGNOSIS — K5669 Other intestinal obstruction: Secondary | ICD-10-CM | POA: Diagnosis not present

## 2015-09-17 LAB — CBC
HEMATOCRIT: 35.9 % — AB (ref 40.0–52.0)
HEMOGLOBIN: 12.8 g/dL — AB (ref 13.0–18.0)
MCH: 31.2 pg (ref 26.0–34.0)
MCHC: 35.7 g/dL (ref 32.0–36.0)
MCV: 87.3 fL (ref 80.0–100.0)
Platelets: 213 10*3/uL (ref 150–440)
RBC: 4.12 MIL/uL — AB (ref 4.40–5.90)
RDW: 14.6 % — ABNORMAL HIGH (ref 11.5–14.5)
WBC: 9.4 10*3/uL (ref 3.8–10.6)

## 2015-09-17 LAB — BASIC METABOLIC PANEL
Anion gap: 10 (ref 5–15)
BUN: 23 mg/dL — ABNORMAL HIGH (ref 6–20)
CHLORIDE: 95 mmol/L — AB (ref 101–111)
CO2: 31 mmol/L (ref 22–32)
Calcium: 9.3 mg/dL (ref 8.9–10.3)
Creatinine, Ser: 1.06 mg/dL (ref 0.61–1.24)
GFR calc non Af Amer: >60 mL/min (ref >60)
Glucose, Bld: 112 mg/dL — ABNORMAL HIGH (ref 65–99)
POTASSIUM: 3.5 mmol/L (ref 3.5–5.1)
SODIUM: 136 mmol/L (ref 135–145)

## 2015-09-17 LAB — PSA: PSA: 0.23 ng/mL (ref 0.00–4.00)

## 2015-09-17 NOTE — Progress Notes (Signed)
[place] [facility] [place] NAME: Zachary Robertson    MR#:  [id number]  DATE OF BIRTH:  Mar 18, 1941  SUBJECTIVE:  CHIEF COMPLAINT:  No chief complaint on file.  Still in pain, abd distended. Nausea but no vomiting REVIEW OF SYSTEMS:  Review of Systems  Constitutional: Negative for fever, weight loss, malaise/fatigue and diaphoresis.  HENT: Negative for ear discharge, ear pain, hearing loss, nosebleeds, sore throat and tinnitus.   Eyes: Negative for blurred vision and pain.  Respiratory: Negative for cough, hemoptysis, shortness of breath and wheezing.   Cardiovascular: Negative for chest pain, palpitations, orthopnea and leg swelling.  Gastrointestinal: Positive for nausea, vomiting and abdominal pain. Negative for heartburn, diarrhea, constipation and blood in stool.  Genitourinary: Negative for dysuria, urgency and frequency.  Musculoskeletal: Negative for myalgias and back pain.  Skin: Negative for itching and rash.  Neurological: Negative for dizziness, tingling, tremors, focal weakness, seizures, weakness and headaches.  Psychiatric/Behavioral: Negative for depression. The patient is not nervous/anxious.     DRUG ALLERGIES:  No Known Allergies VITALS:  Blood pressure 135/62, pulse 64, temperature 98.5 F (36.9 C), temperature source Oral, resp. rate 18, height 5\' 10"  (1.778 m), weight 103.103 kg (227 lb 4.8 oz), SpO2 97 %. PHYSICAL EXAMINATION:  Physical Exam  Constitutional: He is oriented to person, place, and time and well-developed, well-nourished, and in no distress.  HENT:  Head: Normocephalic and atraumatic.  Eyes: Conjunctivae and EOM are normal. Pupils are equal, round, and reactive to light.  Neck: Normal range of motion. Neck supple. No tracheal deviation present. No thyromegaly present.  Cardiovascular: Normal rate, regular rhythm and normal heart sounds.   Pulmonary/Chest: Effort normal and breath sounds normal. No respiratory  distress. He has no wheezes. He exhibits no tenderness.  Abdominal: Soft. Bowel sounds are normal. He exhibits distension. There is generalized tenderness.  Musculoskeletal: Normal range of motion.  Neurological: He is alert and oriented to person, place, and time. No cranial nerve deficit.  Skin: Skin is warm and dry. No rash noted.  Psychiatric: Mood and affect normal.   LABORATORY PANEL:   CBC  Recent Labs Lab 09/17/15 0346  WBC 9.4  HGB 12.8*  HCT 35.9*  PLT 213   ------------------------------------------------------------------------------------------------------------------ Chemistries   Recent Labs Lab 09/16/15 1615 09/17/15 0346  NA 133* 136  K 3.1* 3.5  CL 96* 95*  CO2 27 31  GLUCOSE 111* 112*  BUN 25* 23*  CREATININE 1.10 1.06  CALCIUM 9.7 9.3  AST 24  --   ALT 27  --   ALKPHOS 88  --   BILITOT 0.6  --    RADIOLOGY:  Dg Abd 1 View  09/17/2015  CLINICAL DATA:  Nasogastric tube placed. EXAM: ABDOMEN - 1 VIEW COMPARISON:  CT and radiographs, 09/16/2015 FINDINGS: Nasogastric tube extends into the stomach. There is persistent small bowel dilatation consistent with obstruction. No extraluminal air is evident. IMPRESSION: Nasogastric tube reaches the stomach. Persistent small bowel obstruction. Electronically Signed   By: Andreas Newport M.D.   On: 09/17/2015 01:29   Dg Abd 1 View  09/16/2015  CLINICAL DATA:  Small-bowel obstruction. EXAM: ABDOMEN - 1 VIEW COMPARISON:  CT abdomen from earlier same day. FINDINGS: Prominently distended gas-filled loops of small bowel within the central abdomen and left lower abdomen, compatible with the small bowel obstruction seen on earlier CT. Paucity of colonic bowel gas. No evidence of free intraperitoneal air seen. IMPRESSION: Small-bowel obstruction, compatible with the obstruction  seen on earlier CT. Electronically Signed   By: Franki Cabot M.D.   On: 09/16/2015 21:23   Ct Abdomen Pelvis W Contrast  09/16/2015  CLINICAL  DATA:  Generalized abdominal pain, vomiting and diarrhea for 1 month. History of hernia repair. EXAM: CT ABDOMEN AND PELVIS WITH CONTRAST TECHNIQUE: Multidetector CT imaging of the abdomen and pelvis was performed using the standard protocol following bolus administration of intravenous contrast. CONTRAST:  176mL ISOVUE-300 IOPAMIDOL (ISOVUE-300) INJECTION 61% COMPARISON:  06/19/2015 abdominal radiographs and 06/16/2015 CT abdomen/pelvis. FINDINGS: Motion degraded scan. Lower chest: No significant pulmonary nodules or acute consolidative airspace disease. Hepatobiliary: Normal liver with no liver mass. Normal gallbladder with no radiopaque cholelithiasis. No biliary ductal dilatation. Pancreas: Normal, with no mass or duct dilation. Spleen: Normal size. No mass. Adrenals/Urinary Tract: Normal adrenals. Simple 1.2 cm medial upper right renal cyst. Simple 3.0 cm upper left renal cyst. No hydronephrosis. Collapsed and grossly normal bladder. Stomach/Bowel: Grossly normal stomach. There are prominently dilated (up to 5.0 cm diameter) fluid-filled proximal to mid small bowel loops throughout the abdomen. The distal and terminal ileum is collapsed. There is a focal caliber transition in the distal small bowel in the right abdomen at the site of a 4.9 x 4.3 cm solid small bowel mass (series 2/ image 53). There is stool sign within the small bowel proximal to the small bowel mass. Collapsed large bowel. Mild sigmoid diverticulosis. No large bowel wall thickening or pericolonic fat stranding. Vascular/Lymphatic: Atherosclerotic nonaneurysmal abdominal aorta. Patent portal, splenic, hepatic and renal veins. There are several mildly enlarged lymph nodes in the right mesentery adjacent to the small bowel mass, largest 1.4 cm (series 2/ image 58). No additional pathologically enlarged abdominopelvic nodes. Reproductive: Top-normal size prostate. Other: No pneumoperitoneum, ascites or focal fluid collection. Musculoskeletal: No  aggressive appearing focal osseous lesions. Marked thoracolumbar spondylosis. IMPRESSION: 1. Distal small bowel obstruction due to a distal small bowel 4.9 x 4.3 cm mass in the right abdomen with adjacent mesenteric adenopathy. Considerations include lymphoma, carcinoid, metastasis or adenocarcinoma. Surgical consultation advised. 2. Aortic atherosclerosis. These results were called by telephone at the time of interpretation on 09/16/2015 at 5:55 pm to NP [facility] , who verbally acknowledged these results. Electronically Signed   By: Ilona Sorrel M.D.   On: 09/16/2015 17:57   ASSESSMENT AND PLAN:  . SBO (small bowel obstruction) ([place]) - continue Nothing by mouth, IV fluid hydration -Pain medication as needed, antiemetics -NG tube to low wall suction - Appreciate surgery input - possible laproscopic intervention vs open - family deciding  4.9 x 4.3 cm abdominal mass  -Surgery deciding need for OR -CEA, PSA levels pending  . Benign fibroma of prostate -aware, medication held   . Essential (primary) hypertension -aware, medication held  -Lopressor ordered when necessary  . HLD (hyperlipidemia) -Aware, medication held     All the records are reviewed and case discussed with Care Management/Social Worker. Management plans discussed with the patient, family and they are in agreement.  CODE STATUS: FULL CODE  TOTAL TIME TAKING CARE OF THIS PATIENT: 35 minutes.   More than 50% of the time was spent in counseling/coordination of care: YES  POSSIBLE D/C IN 2-3 DAYS, DEPENDING ON CLINICAL CONDITION.   [facility], Elisabella Hacker M.D on 09/17/2015 at 3:57 PM  Between 7am to 6pm - Pager - [phone]  After 6pm go to [email or web address] - Proofreader  [employer]  [facility]  Office  [phone]  CC: Primary care physician; Ezequiel Kayser, MD  Note: This dictation was prepared with Dragon dictation along with smaller phrase technology. Any transcriptional errors that  result from this process are unintentional.

## 2015-09-17 NOTE — Progress Notes (Signed)
Initial Nutrition Assessment  DOCUMENTATION CODES:   Severe malnutrition in context of acute illness/injury  INTERVENTION:  -Monitor poc and diet progression. Pt may benefit from nutrition support as pt already with significant wt loss and poor po intake prior to admission.    NUTRITION DIAGNOSIS:   Malnutrition related to acute illness as evidenced by percent weight loss, energy intake < or equal to 50% for > or equal to 5 days.    GOAL:   Patient will meet greater than or equal to 90% of their needs    MONITOR:   Diet advancement  REASON FOR ASSESSMENT:   Malnutrition Screening Tool    ASSESSMENT:      Pt admitted with abdominal pain, nausea, vomiting. Was seen by GI yesterday as outpatient and CT performed which showed distal small obstruction and mass. Was sent for direct admission to hospital.  Recent admission in March for SBO  Past Medical History  Diagnosis Date  . GERD (gastroesophageal reflux disease)   . Nocturia   . Urgency-frequency syndrome   . Dribbling   . Hesitancy   . HTN (hypertension)   . BPH (benign prostatic hyperplasia)   . Obese   . ED (erectile dysfunction)   . Hypogonadism in male   . Essential (primary) hypertension 06/03/2015  . Acid reflux 06/03/2015  . Adenomatous colon polyp 08/12/2014    Overview:  On 05/25/2011 colonoscopy; repeat 05/2016 (Dr. Gustavo Lah)   . Glaucoma 08/07/2013   Pt reports poor po intake for the past month.  Eating about 1/2 of normal intake.    NG tube currently in place at this time :642ml output noted  Medications reviewed: 1/2 NS with KCl at 166ml/hr Labs reviewed: glucose 112  Nutrition-Focused physical exam completed. Findings are WDL for fat depletion, muscle depletion, and edema.  Pt reports weakness, decreased energy.  Diet Order:  Diet NPO time specified Except for: Sips with Meds  Skin:  Reviewed, no issues  Last BM:  6/26  Height:   Ht Readings from Last 1 Encounters:  06/16/15 5\' 10"   (1.778 m)    Weight: noted per wt encounter 30 pound wt loss in the last 3 months (12% wt loss in 3 months)   Wt Readings from Last 1 Encounters:  09/16/15 227 lb 4.8 oz (103.103 kg)   Wt Readings from Last 10 Encounters:  09/16/15  227 lb 4.8 oz (103.103 kg)  06/16/15  249 lb 8 oz (113.172 kg)  06/03/15  257 lb (116.574 kg)  Ideal Body Weight:     BMI:  Body mass index is 32.61 kg/(m^2).  Estimated Nutritional Needs:   Kcal:  [id number] kcals/d  Protein:  123-154 g/d  Fluid:  >/= 1754ml/d  EDUCATION NEEDS:   No education needs identified at this time  Zachary Robertson B. Zenia Resides, [place], [place] (pager) Weekend/On-Call pager [phone])

## 2015-09-17 NOTE — Telephone Encounter (Signed)
Patient scheduled to be seen in the office by Dr. Azalee Course on 6/28 at 0900am. Patient given appointment information and directions to [facility].

## 2015-09-17 NOTE — Consult Note (Signed)
Patient ID: Zachary Robertson, male   DOB: [birth year], 75 y.o.   MRN: [id number]  CC: Bowel Obstruction  HPI Zachary Robertson is a 75 y.o. male who is well-known to the surgery service was admitted to the medicine service overnight secondary to recurrent small bowel obstruction. General surgery consultation was requested by Dr. Manuella Ghazi for evaluation of recurrent small bowel obstruction. Patient was admitted for small bowel obstruction approximately 4 months ago and he reports since that time he is continued to have intermittent abdominal pain followed by nausea and vomiting is then followed by diarrhea. This is happen at least 3 separate times since discharge. Over the last week this has recurred again at the worst it has been. He has been afraid to eat secondary to pain and has had a weight loss of 25-30 pounds. Patient reports feeling better since admission with placement of an NG tube in terms of nausea but continues to feel bloated. He denies any fevers or chills but has had malaise, nausea, vomiting, diarrhea. He had an outpatient CT scan which had findings concerning for small bowel tumor.  HPI  Past Medical History  Diagnosis Date  . GERD (gastroesophageal reflux disease)   . Nocturia   . Urgency-frequency syndrome   . Dribbling   . Hesitancy   . HTN (hypertension)   . BPH (benign prostatic hyperplasia)   . Obese   . ED (erectile dysfunction)   . Hypogonadism in male   . Essential (primary) hypertension 06/03/2015  . Acid reflux 06/03/2015  . Adenomatous colon polyp 08/12/2014    Overview:  On 05/25/2011 colonoscopy; repeat 05/2016 (Dr. Gustavo Lah)   . Glaucoma 08/07/2013    Past Surgical History  Procedure Laterality Date  . Hernia repair      Family History  Problem Relation Age of Onset  . Prostate cancer Father   . Cancer      family members in general  . Bladder Cancer Neg Hx   . Kidney cancer Neg Hx     Social History Social History  Substance Use Topics  . Smoking status:  Former Research scientist (life sciences)  . Smokeless tobacco: None     Comment: occasional cigar  . Alcohol Use: 1.2 oz/week    0 Standard drinks or equivalent, 2 Shots of liquor per week     Comment: moderate    No Known Allergies  Current Facility-Administered Medications  Medication Dose Route Frequency Provider Last Rate Last Dose  . 0.45 % NaCl with KCl 20 mEq / L infusion   Intravenous Continuous [name], MD 100 mL/hr at 09/17/15 0656    . acetaminophen (TYLENOL) tablet 650 mg  650 mg Oral Q6H PRN [name], MD       Or  . acetaminophen (TYLENOL) suppository 650 mg  650 mg Rectal Q6H PRN [name], MD      . enoxaparin (LOVENOX) injection 40 mg  40 mg Subcutaneous Q24H [name], MD   40 mg at 09/16/15 2318  . latanoprost (XALATAN) 0.005 % ophthalmic solution 1 drop  1 drop Both Eyes BH-q7a Quintella Baton, MD   1 drop at 09/17/15 0702  . metoprolol (LOPRESSOR) injection 5 mg  5 mg Intravenous Q4H PRN [name], MD      . morphine 2 MG/ML injection 2 mg  2 mg Intravenous Q4H PRN [name], MD      . ondansetron (ZOFRAN) injection 4 mg  4 mg Intravenous Q6H PRN [name], MD   4 mg at 09/16/15  2134  . timolol (TIMOPTIC) 0.5 % ophthalmic solution 1 drop  1 drop Both Eyes BH-q7a Quintella Baton, MD   1 drop at 09/17/15 0702     Review of Systems A Multi-point review of systems was asked and was negative except for the findings documented in the history of present illness  Physical Exam Blood pressure 135/62, pulse 64, temperature 98.5 F (36.9 C), temperature source Oral, resp. rate 18, height 5\' 10"  (1.778 m), weight 103.103 kg (227 lb 4.8 oz), SpO2 97 %. CONSTITUTIONAL: No acute distress. EYES: Pupils are equal, round, and reactive to light, Sclera are non-icteric. EARS, NOSE, MOUTH AND THROAT: The oropharynx is clear. The oral mucosa is pink and moist. Hearing is intact to voice. LYMPH NODES:  Lymph nodes in the neck are normal. RESPIRATORY:  Lungs are clear. There is normal  respiratory effort, with equal breath sounds bilaterally, and without pathologic use of accessory muscles. CARDIOVASCULAR: Heart is regular without murmurs, gallops, or rubs. GI: The abdomen is large, soft, nontender, and mildly distended with tympany. There are no palpable masses. There is no hepatosplenomegaly. There are normal bowel sounds in all quadrants. Well-healed prior umbilical hernia repair GU: Rectal deferred.   MUSCULOSKELETAL: Normal muscle strength and tone. No cyanosis or edema.   SKIN: Turgor is good and there are no pathologic skin lesions or ulcers. NEUROLOGIC: Motor and sensation is grossly normal. Cranial nerves are grossly intact. PSYCH:  Oriented to person, place and time. Affect is normal.  Data Reviewed Images and labs reviewed. Labs on admission showed a mild hypokalemia that has improved with replacement and a mild anemia with a hemoglobin of 12.8. Otherwise his last been within normal limits. His CT scan does demonstrate dilated loops of small bowel going to what appears to be a solid mass in the distal ileum. There is decompressed terminal ileum and colon distal to the visualized mass. There appears to be some enlarged lymph nodes in the area but no obvious other masses or concerning findings. I have personally reviewed the patient's imaging, laboratory findings and medical records.    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 This would explain the waxing and waning nature of the symptoms she's had over the last 3 months. Discussed with the patient the numerous possible diagnoses that this could be and that the diagnostic and therapeutic intervention of choice would be surgery. Discussed attempting a laparoscopic approach to identify and remove the area of small bowel however that the likelihood of requiring an open procedure was high. The  risks, benefits, alternatives were discussed in detail with the risks to include pain, bleeding, need for an open abdomen, need for recurrent surgeries, possible need for an ostomy. The possible benefits to include resolution of his bowel obstruction and improvement in his ability to maintain nutrition as well as having a final diagnosis. The alternatives were to include do nothing however this would then prolong the patient's suffering without knowing what the underlying cause would be. All questions were answered to the patient and his wife's satisfaction. Plan for operative intervention tomorrow as the OR schedule allows.     Time spent with the patient was 45 minutes, with more than 50% of the time spent in face-to-face education, counseling and care coordination.     Clayburn Pert, MD FACS General Surgeon 09/17/2015, 2:11 PM

## 2015-09-17 NOTE — Telephone Encounter (Signed)
Called patient to schedule him an appointment to see him tomorrow (09/18/2015) and he stated that he was still at the hospital. I apologized and told him that we were informed to give him a call and schedule an appointment. However, I told him that since he was still at the hospital to disregard out call. I also told him that we might call him after he is discharged in case they do not do surgery on him while he is at the hospital. Patient understood.

## 2015-09-18 ENCOUNTER — Inpatient Hospital Stay: Payer: Self-pay | Admitting: Surgery

## 2015-09-18 ENCOUNTER — Inpatient Hospital Stay: Payer: PPO | Admitting: Certified Registered"

## 2015-09-18 ENCOUNTER — Encounter
Admission: RE | Disposition: A | Discharge status: Home / Self Care | Payer: Self-pay | Source: Ambulatory Visit | Attending: Internal Medicine

## 2015-09-18 ENCOUNTER — Encounter: Payer: Self-pay | Admitting: *Deleted

## 2015-09-18 DIAGNOSIS — C8333 Diffuse large B-cell lymphoma, intra-abdominal lymph nodes: Secondary | ICD-10-CM | POA: Diagnosis not present

## 2015-09-18 DIAGNOSIS — K5660 Unspecified intestinal obstruction: Secondary | ICD-10-CM | POA: Diagnosis not present

## 2015-09-18 DIAGNOSIS — C179 Malignant neoplasm of small intestine, unspecified: Secondary | ICD-10-CM | POA: Diagnosis not present

## 2015-09-18 DIAGNOSIS — C8339 Diffuse large B-cell lymphoma, extranodal and solid organ sites: Secondary | ICD-10-CM | POA: Diagnosis not present

## 2015-09-18 DIAGNOSIS — K5669 Other intestinal obstruction: Secondary | ICD-10-CM | POA: Diagnosis not present

## 2015-09-18 DIAGNOSIS — I10 Essential (primary) hypertension: Secondary | ICD-10-CM | POA: Diagnosis not present

## 2015-09-18 HISTORY — PX: BOWEL RESECTION: SHX1257

## 2015-09-18 HISTORY — PX: LAPAROSCOPY: SHX197

## 2015-09-18 HISTORY — PX: LAPAROTOMY: SHX154

## 2015-09-18 LAB — CBC
HEMATOCRIT: 38.7 % — AB (ref 40.0–52.0)
Hemoglobin: 13 g/dL (ref 13.0–18.0)
MCH: 30.1 pg (ref 26.0–34.0)
MCHC: 33.7 g/dL (ref 32.0–36.0)
MCV: 89.3 fL (ref 80.0–100.0)
PLATELETS: 192 10*3/uL (ref 150–440)
RBC: 4.33 MIL/uL — ABNORMAL LOW (ref 4.40–5.90)
RDW: 14.4 % (ref 11.5–14.5)
WBC: 12.8 10*3/uL — AB (ref 3.8–10.6)

## 2015-09-18 LAB — SURGICAL PCR SCREEN
MRSA, PCR: NEGATIVE
STAPHYLOCOCCUS AUREUS: NEGATIVE

## 2015-09-18 LAB — GLUCOSE, CAPILLARY: Glucose-Capillary: 134 mg/dL — ABNORMAL HIGH (ref 65–99)

## 2015-09-18 LAB — CREATININE, SERUM: Creatinine, Ser: 1.01 mg/dL (ref 0.61–1.24)

## 2015-09-18 LAB — CEA: CEA: 0.9 ng/mL (ref 0.0–4.7)

## 2015-09-18 SURGERY — LAPAROTOMY, EXPLORATORY
Anesthesia: General | Site: Abdomen | Wound class: Clean Contaminated

## 2015-09-18 MED ORDER — LIDOCAINE HCL (CARDIAC) 20 MG/ML IV SOLN
INTRAVENOUS | Status: DC | PRN
  Administered 2015-09-18: 100 mg via INTRAVENOUS

## 2015-09-18 MED ORDER — ONDANSETRON HCL 4 MG/2ML IJ SOLN: 4.0000 mg | Freq: Once | INTRAMUSCULAR | Status: DC | PRN

## 2015-09-18 MED ORDER — ROCURONIUM BROMIDE 100 MG/10ML IV SOLN
INTRAVENOUS | Status: DC | PRN
  Administered 2015-09-18: 10 mg via INTRAVENOUS
  Administered 2015-09-18: 30 mg via INTRAVENOUS
  Administered 2015-09-18 (×2): 10 mg via INTRAVENOUS

## 2015-09-18 MED ORDER — FENTANYL CITRATE (PF) 100 MCG/2ML IJ SOLN
25.0000 ug | INTRAMUSCULAR | Status: AC | PRN
  Administered 2015-09-18 (×6): 25 ug via INTRAVENOUS

## 2015-09-18 MED ORDER — PROPOFOL 10 MG/ML IV BOLUS
INTRAVENOUS | Status: DC | PRN
  Administered 2015-09-18: 150 mg via INTRAVENOUS

## 2015-09-18 MED ORDER — CEFAZOLIN SODIUM 1 G IJ SOLR
INTRAMUSCULAR | Status: DC | PRN
  Administered 2015-09-18: 2 g via INTRAMUSCULAR

## 2015-09-18 MED ORDER — ONDANSETRON HCL 4 MG/2ML IJ SOLN
INTRAMUSCULAR | Status: DC | PRN
  Administered 2015-09-18 (×2): 4 mg via INTRAVENOUS

## 2015-09-18 MED ORDER — FENTANYL CITRATE (PF) 100 MCG/2ML IJ SOLN
INTRAMUSCULAR | Status: DC | PRN
  Administered 2015-09-18 (×3): 50 ug via INTRAVENOUS
  Administered 2015-09-18: 100 ug via INTRAVENOUS

## 2015-09-18 MED ORDER — DEXAMETHASONE SODIUM PHOSPHATE 10 MG/ML IJ SOLN
INTRAMUSCULAR | Status: DC | PRN
  Administered 2015-09-18: 4 mg via INTRAVENOUS
  Administered 2015-09-18: 5 mg via INTRAVENOUS

## 2015-09-18 MED ORDER — SUCCINYLCHOLINE CHLORIDE 20 MG/ML IJ SOLN
INTRAMUSCULAR | Status: DC | PRN
  Administered 2015-09-18: 120 mg via INTRAVENOUS

## 2015-09-18 MED ORDER — LIDOCAINE HCL (PF) 1 % IJ SOLN
INTRAMUSCULAR | Status: DC | PRN
  Administered 2015-09-18: 4 mL

## 2015-09-18 MED ORDER — FENTANYL CITRATE (PF) 100 MCG/2ML IJ SOLN
INTRAMUSCULAR | Status: AC
  Administered 2015-09-18: 25 ug via INTRAVENOUS
  Filled 2015-09-18: qty 2

## 2015-09-18 MED ORDER — LACTATED RINGERS IV SOLN
INTRAVENOUS | Status: DC
  Administered 2015-09-18: 13:00:00 via INTRAVENOUS

## 2015-09-18 MED ORDER — ACETAMINOPHEN 10 MG/ML IV SOLN
INTRAVENOUS | Status: AC
  Filled 2015-09-18: qty 100

## 2015-09-18 MED ORDER — LACTATED RINGERS IV SOLN
INTRAVENOUS | Status: DC
  Administered 2015-09-18 – 2015-09-22 (×11): via INTRAVENOUS

## 2015-09-18 MED ORDER — MIDAZOLAM HCL 2 MG/2ML IJ SOLN
INTRAMUSCULAR | Status: DC | PRN
  Administered 2015-09-18 (×2): 1 mg via INTRAVENOUS

## 2015-09-18 MED ORDER — BUPIVACAINE HCL (PF) 0.5 % IJ SOLN
INTRAMUSCULAR | Status: DC | PRN
  Administered 2015-09-18: 4 mL

## 2015-09-18 MED ORDER — ACETAMINOPHEN 10 MG/ML IV SOLN
INTRAVENOUS | Status: DC | PRN
  Administered 2015-09-18: 1000 mg via INTRAVENOUS

## 2015-09-18 MED ORDER — ENOXAPARIN SODIUM 40 MG/0.4ML ~~LOC~~ SOLN
40.0000 mg | SUBCUTANEOUS | Status: DC
  Administered 2015-09-19 – 2015-09-22 (×4): 40 mg via SUBCUTANEOUS
  Filled 2015-09-18 (×4): qty 0.4

## 2015-09-18 MED ORDER — SUGAMMADEX SODIUM 200 MG/2ML IV SOLN
INTRAVENOUS | Status: DC | PRN
  Administered 2015-09-18: 206 mg via INTRAVENOUS

## 2015-09-18 SURGICAL SUPPLY — 59 items
BLADE SURG SZ10 CARB STEEL (BLADE) ×4 IMPLANT
BLADE SURG SZ11 CARB STEEL (BLADE) ×4 IMPLANT
BULB RESERV EVAC DRAIN JP 100C (MISCELLANEOUS) IMPLANT
CANISTER SUCT 1200ML W/VALVE (MISCELLANEOUS) ×4 IMPLANT
CATH TRAY 16F METER LATEX (MISCELLANEOUS) ×4 IMPLANT
CHLORAPREP W/TINT 26ML (MISCELLANEOUS) ×4 IMPLANT
DRAIN CHANNEL JP 15F RND 16 (MISCELLANEOUS) IMPLANT
DRAPE LAPAROTOMY 100X77 ABD (DRAPES) ×4 IMPLANT
DRSG OPSITE POSTOP 3X4 (GAUZE/BANDAGES/DRESSINGS) ×4 IMPLANT
DRSG OPSITE POSTOP 4X12 (GAUZE/BANDAGES/DRESSINGS) ×4 IMPLANT
DRSG OPSITE POSTOP 4X8 (GAUZE/BANDAGES/DRESSINGS) ×4 IMPLANT
ELECT CAUTERY BLADE 6.4 (BLADE) ×4 IMPLANT
ELECT REM PT RETURN 9FT ADLT (ELECTROSURGICAL) ×4
ELECTRODE REM PT RTRN 9FT ADLT (ELECTROSURGICAL) ×2 IMPLANT
GAUZE SPONGE 4X4 12PLY STRL (GAUZE/BANDAGES/DRESSINGS) ×4 IMPLANT
GLOVE BIO SURGEON STRL SZ7.5 (GLOVE) ×60 IMPLANT
GLOVE INDICATOR 8.0 STRL GRN (GLOVE) IMPLANT
GOWN STRL REUS W/ TWL LRG LVL3 (GOWN DISPOSABLE) ×16 IMPLANT
GOWN STRL REUS W/TWL LRG LVL3 (GOWN DISPOSABLE) ×16
IRRIGATION STRYKERFLOW (MISCELLANEOUS) ×2 IMPLANT
IRRIGATOR STRYKERFLOW (MISCELLANEOUS) ×4
IV NS 1000ML (IV SOLUTION) ×2
IV NS 1000ML BAXH (IV SOLUTION) ×2 IMPLANT
KIT RM TURNOVER STRD PROC AR (KITS) ×4 IMPLANT
LABEL OR SOLS (LABEL) ×4 IMPLANT
LIGASURE IMPACT 36 18CM CVD LR (INSTRUMENTS) ×8 IMPLANT
NDL SAFETY 25GX1.5 (NEEDLE) ×4 IMPLANT
NEEDLE VERESS 14GA 120MM (NEEDLE) IMPLANT
NS IRRIG 1000ML POUR BTL (IV SOLUTION) ×4 IMPLANT
NS IRRIG 500ML POUR BTL (IV SOLUTION) ×4 IMPLANT
PACK BASIN MAJOR ARMC (MISCELLANEOUS) IMPLANT
PACK COLON CLEAN CLOSURE (MISCELLANEOUS) ×8 IMPLANT
PACK LAP CHOLECYSTECTOMY (MISCELLANEOUS) ×4 IMPLANT
RELOAD PROXIMATE 75MM BLUE (ENDOMECHANICALS) ×12 IMPLANT
SCALPEL HARMONIC ACE (MISCELLANEOUS) IMPLANT
SCISSORS METZENBAUM CVD 33 (INSTRUMENTS) IMPLANT
SHEARS HARMONIC STRL 23CM (MISCELLANEOUS) IMPLANT
SLEEVE ENDOPATH XCEL 5M (ENDOMECHANICALS) ×8 IMPLANT
SPONGE LAP 18X18 5 PK (GAUZE/BANDAGES/DRESSINGS) ×8 IMPLANT
STAPLER PROXIMATE 75MM BLUE (STAPLE) ×4 IMPLANT
STAPLER SKIN PROX 35W (STAPLE) ×4 IMPLANT
SUT PDS #1 CTX NDL (SUTURE) ×8 IMPLANT
SUT PDS AB 1 TP1 96 (SUTURE) ×4 IMPLANT
SUT SILK 2 0 SH (SUTURE) ×16 IMPLANT
SUT SILK 3 0 (SUTURE) ×2
SUT SILK 3-0 (SUTURE) ×2
SUT SILK 3-0 18XBRD TIE 12 (SUTURE) ×2 IMPLANT
SUT SILK 3-0 SH-1 18XCR BRD (SUTURE) ×2
SUT VIC AB 2-0 CT2 27 (SUTURE) ×4 IMPLANT
SUT VIC AB 3-0 SH 27 (SUTURE) ×2
SUT VIC AB 3-0 SH 27X BRD (SUTURE) ×2 IMPLANT
SUT VICRYL 0 AB UR-6 (SUTURE) ×4 IMPLANT
SUTURE SILK 3-0 SH-1 18XCR BRD (SUTURE) ×2 IMPLANT
SYR BULB IRRIG 60ML STRL (SYRINGE) ×4 IMPLANT
TOWEL OR 17X26 4PK STRL BLUE (TOWEL DISPOSABLE) ×4 IMPLANT
TROCAR XCEL BLUNT TIP 100MML (ENDOMECHANICALS) IMPLANT
TROCAR XCEL NON-BLD 11X100MML (ENDOMECHANICALS) IMPLANT
TROCAR XCEL NON-BLD 5MMX100MML (ENDOMECHANICALS) ×4 IMPLANT
TUBING INSUFFLATOR HI FLOW (MISCELLANEOUS) ×4 IMPLANT

## 2015-09-18 NOTE — Brief Op Note (Signed)
09/16/2015 - 09/18/2015  3:43 PM  PATIENT:  Zachary Robertson  75 y.o. male  PRE-OPERATIVE DIAGNOSIS:  SMALL BOWEL OBSTRUCTION  POST-OPERATIVE DIAGNOSIS:  SMALL BOWEL OBSTRUCTION  PROCEDURE:  Procedure(s) with comments: EXPLORATORY LAPAROTOMY (N/A) LAPAROSCOPY DIAGNOSTIC SMALL BOWEL RESECTION - with anastomosis  SURGEON:  Surgeon(s) and Role:    * Clayburn Pert, MD - Primary    * Nestor Lewandowsky, MD - Assisting  PHYSICIAN ASSISTANT:   ASSISTANTS: none   ANESTHESIA:   general  EBL:  Total I/O In: 760 [I.V.:700; NG/GT:60] Out: 500 [Urine:100; Emesis/NG output:400]  BLOOD ADMINISTERED:none  DRAINS: none   LOCAL MEDICATIONS USED:  MARCAINE   , XYLOCAINE  and Amount: 10  ml  SPECIMEN:  Source of Specimen:  small bowel  DISPOSITION OF SPECIMEN:  PATHOLOGY  COUNTS:  YES  TOURNIQUET:  * No tourniquets in log *  DICTATION: .Dragon Dictation  PLAN OF CARE: return to inpatient status  PATIENT DISPOSITION:  PACU - hemodynamically stable.   Delay start of Pharmacological VTE agent (>24hrs) due to surgical blood loss or risk of bleeding: no

## 2015-09-18 NOTE — Progress Notes (Signed)
[place] [facility] [place] NAME: Zachary Robertson    MR#:  [id number]  DATE OF BIRTH:  [birth year]  SUBJECTIVE:  CHIEF COMPLAINT:  No chief complaint on file.  Still in pain, abd distended. Waiting for surgery when I saw. Family at bedside REVIEW OF SYSTEMS:  Review of Systems  Constitutional: Negative for fever, weight loss, malaise/fatigue and diaphoresis.  HENT: Negative for ear discharge, ear pain, hearing loss, nosebleeds, sore throat and tinnitus.   Eyes: Negative for blurred vision and pain.  Respiratory: Negative for cough, hemoptysis, shortness of breath and wheezing.   Cardiovascular: Negative for chest pain, palpitations, orthopnea and leg swelling.  Gastrointestinal: Positive for nausea, vomiting and abdominal pain. Negative for heartburn, diarrhea, constipation and blood in stool.  Genitourinary: Negative for dysuria, urgency and frequency.  Musculoskeletal: Negative for myalgias and back pain.  Skin: Negative for itching and rash.  Neurological: Negative for dizziness, tingling, tremors, focal weakness, seizures, weakness and headaches.  Psychiatric/Behavioral: Negative for depression. The patient is not nervous/anxious.     DRUG ALLERGIES:  No Known Allergies VITALS:  Blood pressure 152/69, pulse 77, temperature 97.9 F (36.6 C), temperature source Tympanic, resp. rate 19, height 5\' 10"  (1.778 m), weight 102.967 kg (227 lb), SpO2 97 %. PHYSICAL EXAMINATION:  Physical Exam  Constitutional: He is oriented to person, place, and time and well-developed, well-nourished, and in no distress.  HENT:  Head: Normocephalic and atraumatic.  Eyes: Conjunctivae and EOM are normal. Pupils are equal, round, and reactive to light.  Neck: Normal range of motion. Neck supple. No tracheal deviation present. No thyromegaly present.  Cardiovascular: Normal rate, regular rhythm and normal heart sounds.   Pulmonary/Chest: Effort normal and breath sounds  normal. No respiratory distress. He has no wheezes. He exhibits no tenderness.  Abdominal: Soft. Bowel sounds are normal. He exhibits distension. There is generalized tenderness.  Musculoskeletal: Normal range of motion.  Neurological: He is alert and oriented to person, place, and time. No cranial nerve deficit.  Skin: Skin is warm and dry. No rash noted.  Psychiatric: Mood and affect normal.   LABORATORY PANEL:   CBC  Recent Labs Lab 09/17/15 0346  WBC 9.4  HGB 12.8*  HCT 35.9*  PLT 213   ------------------------------------------------------------------------------------------------------------------ Chemistries   Recent Labs Lab 09/16/15 1615 09/17/15 0346  NA 133* 136  K 3.1* 3.5  CL 96* 95*  CO2 27 31  GLUCOSE 111* 112*  BUN 25* 23*  CREATININE 1.10 1.06  CALCIUM 9.7 9.3  AST 24  --   ALT 27  --   ALKPHOS 88  --   BILITOT 0.6  --    RADIOLOGY:  No results found. ASSESSMENT AND PLAN:  . SBO (small bowel obstruction) ([place]) - continue Nothing by mouth, IV fluid hydration -Pain medication as needed, antiemetics -NG tube to low wall suction - Appreciate surgery input - surgery today, medically clear.  4.9 x 4.3 cm abdominal mass  -Surgery today -CEA, PSA levels pending  . Benign fibroma of prostate -aware, medication held   . Essential (primary) hypertension -aware, medication held  -Lopressor ordered when necessary  . HLD (hyperlipidemia) -Aware, medication held     All the records are reviewed and case discussed with Care Management/Social Worker. Management plans discussed with the patient, family and they are in agreement.  CODE STATUS: FULL CODE  TOTAL TIME TAKING CARE OF THIS PATIENT: 35 minutes.   More than 50% of the time was spent  in counseling/coordination of care: YES  POSSIBLE D/C IN 2-3 DAYS, DEPENDING ON CLINICAL CONDITION.   [facility], Ikenna Ohms M.D on 09/18/2015 at 5:05 PM  Between 7am to 6pm - Pager - [phone]  After 6pm  go to [email or web address] - Proofreader  [employer] [facility]  Office  [phone]  CC: Primary care physician; Ezequiel Kayser, MD  Note: This dictation was prepared with Dragon dictation along with smaller phrase technology. Any transcriptional errors that result from this process are unintentional.

## 2015-09-18 NOTE — Anesthesia Preprocedure Evaluation (Addendum)
Anesthesia Evaluation  Patient identified by MRN, date of birth, ID band Patient awake    Reviewed: Allergy & Precautions, NPO status , Patient's Chart, lab work & pertinent test results  Airway Mallampati: III  TM Distance: <3 FB     Dental  (+) Chipped, Caps   Pulmonary former smoker,  ?sleep apnea...  Not diagnosed   Pulmonary exam normal        Cardiovascular hypertension, Pt. on medications Normal cardiovascular exam     Neuro/Psych negative neurological ROS     GI/Hepatic Neg liver ROS, GERD  Medicated and Controlled,  Endo/Other  negative endocrine ROS  Renal/GU Renal InsufficiencyRenal disease Bladder dysfunction  BPH    Musculoskeletal  (+) Arthritis , Osteoarthritis,    Abdominal   Peds negative pediatric ROS (+)  Hematology negative hematology ROS (+)   Anesthesia Other Findings Small bowel obstruction HTN Glaucoma Reflux NG in place since Monday Renal insufficiency  Reproductive/Obstetrics                            Anesthesia Physical Anesthesia Plan  ASA: III  Anesthesia Plan: General   Post-op Pain Management:    Induction: Intravenous, Rapid sequence and Cricoid pressure planned  Airway Management Planned: Oral ETT  Additional Equipment:   Intra-op Plan:   Post-operative Plan: Extubation in OR  Informed Consent: I have reviewed the patients History and Physical, chart, labs and discussed the procedure including the risks, benefits and alternatives for the proposed anesthesia with the patient or authorized representative who has indicated his/her understanding and acceptance.   Dental advisory given  Plan Discussed with: CRNA and Surgeon  Anesthesia Plan Comments:         Anesthesia Quick Evaluation

## 2015-09-18 NOTE — Anesthesia Procedure Notes (Signed)
Procedure Name: Intubation Performed by: Lance Muss Pre-anesthesia Checklist: Emergency Drugs available, Patient identified, Suction available, Patient being monitored and Timeout performed Patient Re-evaluated:Patient Re-evaluated prior to inductionOxygen Delivery Method: Circle system utilized Preoxygenation: Pre-oxygenation with 100% oxygen Intubation Type: IV induction, Cricoid Pressure applied and Rapid sequence Laryngoscope Size: Mac and 3 Grade View: Grade I Tube type: Oral Tube size: 7.5 mm Number of attempts: 1 Airway Equipment and Method: Stylet Placement Confirmation: ETT inserted through vocal cords under direct vision,  positive ETCO2 and breath sounds checked- equal and bilateral Secured at: 23 cm Tube secured with: Tape Dental Injury: Teeth and Oropharynx as per pre-operative assessment

## 2015-09-18 NOTE — Progress Notes (Signed)
Health Care Power of Attorney info. Provided. Prayer for patient was administered.

## 2015-09-18 NOTE — Care Management Important Message (Signed)
Important Message  Patient Details  Name: ESIAS MCTIER MRN: [id number] Date of Birth: 07-Oct-1940   Medicare Important Message Given:  Yes    Beverly Sessions, RN 09/18/2015, 10:25 AM

## 2015-09-18 NOTE — Progress Notes (Signed)
CC: Small bowel tumor Subjective: Patient reports feeling somewhat better this morning. Continues to have NG tube output and intermittent nausea but no vomiting. He has had bowel function overnight. Denies any other changes.  Objective: Vital signs in last 24 hours: Temp:  [97.9 F (36.6 C)-98.5 F (36.9 C)] 98.5 F (36.9 C) (06/28 0420) Pulse Rate:  [64-83] 83 (06/28 0420) Resp:  [17-20] 20 (06/28 0420) BP: (126-155)/(57-68) 126/57 mmHg (06/28 0420) SpO2:  [94 %-98 %] 94 % (06/28 0420) Last BM Date: 09/18/15  Intake/Output from previous day: 06/27 0701 - 06/28 0700 In: 2522.9 [I.V.:2372.9; NG/GT:150] Out: 2530 [Urine:725; Emesis/NG output:1805] Intake/Output this shift:    Physical exam:  Gen.: No acute distress Chest: Clear to auscultation Heart: Regular rate and rhythm Abdomen: Large, mildly distended, soft, tympanic. Minimal tenderness to deep palpation in the lower midline.  Lab Results: CBC   Recent Labs  09/16/15 1615 09/17/15 0346  WBC 8.8 9.4  HGB 13.1 12.8*  HCT 36.6* 35.9*  PLT 223 213   BMET  Recent Labs  09/16/15 1615 09/17/15 0346  NA 133* 136  K 3.1* 3.5  CL 96* 95*  CO2 27 31  GLUCOSE 111* 112*  BUN 25* 23*  CREATININE 1.10 1.06  CALCIUM 9.7 9.3   PT/INR No results for input(s): LABPROT, INR in the last 72 hours. ABG No results for input(s): PHART, HCO3 in the last 72 hours.  Invalid input(s): PCO2, PO2  Studies/Results: Dg Abd 1 View  09/17/2015  CLINICAL DATA:  Nasogastric tube placed. EXAM: ABDOMEN - 1 VIEW COMPARISON:  CT and radiographs, 09/16/2015 FINDINGS: Nasogastric tube extends into the stomach. There is persistent small bowel dilatation consistent with obstruction. No extraluminal air is evident. IMPRESSION: Nasogastric tube reaches the stomach. Persistent small bowel obstruction. Electronically Signed   By: Ellery Plunk M.D.   On: 09/17/2015 01:29   Dg Abd 1 View  09/16/2015  CLINICAL DATA:  Small-bowel obstruction.  EXAM: ABDOMEN - 1 VIEW COMPARISON:  CT abdomen from earlier same day. FINDINGS: Prominently distended gas-filled loops of small bowel within the central abdomen and left lower abdomen, compatible with the small bowel obstruction seen on earlier CT. Paucity of colonic bowel gas. No evidence of free intraperitoneal air seen. IMPRESSION: Small-bowel obstruction, compatible with the obstruction seen on earlier CT. Electronically Signed   By: Bary Richard M.D.   On: 09/16/2015 21:23   Ct Abdomen Pelvis W Contrast  09/16/2015  CLINICAL DATA:  Generalized abdominal pain, vomiting and diarrhea for 1 month. History of hernia repair. EXAM: CT ABDOMEN AND PELVIS WITH CONTRAST TECHNIQUE: Multidetector CT imaging of the abdomen and pelvis was performed using the standard protocol following bolus administration of intravenous contrast. CONTRAST:  ISOVUE-300 IOPAMIDOL (ISOVUE-300) INJECTION 61% COMPARISON:  06/19/2015 abdominal radiographs and 06/16/2015 CT abdomen/pelvis. FINDINGS: Motion degraded scan. Lower chest: No significant pulmonary nodules or acute consolidative airspace disease. Hepatobiliary: Normal liver with no liver mass. Normal gallbladder with no radiopaque cholelithiasis. No biliary ductal dilatation. Pancreas: Normal, with no mass or duct dilation. Spleen: Normal size. No mass. Adrenals/Urinary Tract: Normal adrenals. Simple 1.2 cm medial upper right renal cyst. Simple 3.0 cm upper left renal cyst. No hydronephrosis. Collapsed and grossly normal bladder. Stomach/Bowel: Grossly normal stomach. There are prominently dilated (up to 5.0 cm diameter) fluid-filled proximal to mid small bowel loops throughout the abdomen. The distal and terminal ileum is collapsed. There is a focal caliber transition in the distal small bowel in the right abdomen at the site  of a 4.9 x 4.3 cm solid small bowel mass (series 2/ image 53). There is stool sign within the small bowel proximal to the small bowel mass. Collapsed  large bowel. Mild sigmoid diverticulosis. No large bowel wall thickening or pericolonic fat stranding. Vascular/Lymphatic: Atherosclerotic nonaneurysmal abdominal aorta. Patent portal, splenic, hepatic and renal veins. There are several mildly enlarged lymph nodes in the right mesentery adjacent to the small bowel mass, largest 1.4 cm (series 2/ image 58). No additional pathologically enlarged abdominopelvic nodes. Reproductive: Top-normal size prostate. Other: No pneumoperitoneum, ascites or focal fluid collection. Musculoskeletal: No aggressive appearing focal osseous lesions. Marked thoracolumbar spondylosis. IMPRESSION: 1. Distal small bowel obstruction due to a distal small bowel 4.9 x 4.3 cm mass in the right abdomen with adjacent mesenteric adenopathy. Considerations include lymphoma, carcinoid, metastasis or adenocarcinoma. Surgical consultation advised. 2. Aortic atherosclerosis. These results were called by telephone at the time of interpretation on 09/16/2015 at 5:55 pm to NP [facility] , who verbally acknowledged these results. Electronically Signed   By: Ilona Sorrel M.D.   On: 09/16/2015 17:57    Anti-infectives: Anti-infectives    None      Assessment/Plan:  75 year old male with an intermittently obstructing small bowel tumor. Discussed again with the patient and the family the plans for surgery today. We will attempt to start laparoscopically but discussed there is a high likelihood of having to make an open incision in order to find and remove the aforementioned tumor. All questions answered to the patient's family and patient's satisfaction. Plan for the operating room later today.  Georgine Wiltse T. Adonis Huguenin, MD, FACS  09/18/2015

## 2015-09-18 NOTE — Anesthesia Postprocedure Evaluation (Signed)
Anesthesia Post Note  Patient: ULYSEE MEDLEN  Procedure(s) Performed: Procedure(s) (LRB): EXPLORATORY LAPAROTOMY (N/A) LAPAROSCOPY DIAGNOSTIC SMALL BOWEL RESECTION  Patient location during evaluation: PACU Anesthesia Type: General Level of consciousness: awake Pain management: satisfactory to patient Vital Signs Assessment: post-procedure vital signs reviewed and stable Respiratory status: spontaneous breathing Cardiovascular status: stable Anesthetic complications: no    Last Vitals:  Filed Vitals:   09/18/15 1253 09/18/15 1551  BP: 154/92 143/69  Pulse: 74 65  Temp: 36.7 C 36.9 C  Resp: 18 11    Last Pain:  Filed Vitals:   09/18/15 1554  PainSc: 0-No pain                 VAN STAVEREN,Kailer Heindel

## 2015-09-18 NOTE — Op Note (Signed)
Pre-operative Diagnosis: Small bowel tumor  Post-operative Diagnosis:  Same  Surgeon: Clayburn Pert   Assistants: Dr. Nestor Lewandowsky  Anesthesia: General endotracheal anesthesia  ASA Class: 3  Surgeon: Clayburn Pert, MD FACS  Anesthesia: Gen. with endotracheal tube  Assistant: Dr. Genevive Bi  Procedure Details  The patient was seen again in the Holding Room. The benefits, complications, treatment options, and expected outcomes were discussed with the patient. The risks of bleeding, infection, recurrence of symptoms, failure to resolve symptoms,  bowel injury, any of which could require further surgery were reviewed with the patient.   The patient was taken to Operating Room, identified as Zachary Robertson and the procedure verified.  A Time Out was held and the above information confirmed.  Prior to the induction of general anesthesia, antibiotic prophylaxis was administered. VTE prophylaxis was in place. General endotracheal anesthesia was then administered and tolerated well. After the induction, the abdomen was prepped with Chloraprep and draped in the sterile fashion. The patient was positioned in the supine position.  The procedure began laparoscopic loop. A left upper quadrant Veress needle approach was used to ferrous below the costal margin in the midclavicular line. There is localized with a 50-50 mixture 1% lidocaine without Marcaine plain incised with a 11 blade scalpel and a pneumoperitoneum established at 15 mmHg. Using a 5 mm Optiview trocar access of the peritoneum was able to be obtained easily. There is no evidence of damage to any deeper stranding structures from either the Veress needle or the Optiview trocar. There were no obvious adhesions encountered in the abdomen and a infraumbilical and low midline 5 mm trocar. We placed under direct visualization after localization with the aforementioned local anesthetic.   The bowel was able to run in the was noted to have lower  abdominal adhesions consistent with prior diverticulitis. There was dilated small bowel leading to decompress small bowel associated with an obvious tumor growing off of the mid ileum. This also had what appeared to be inflammatory attachments to the ascending colon. As the mass itself was not freely mobile the decision was made to open at this point.  Her midline incisions were connected with a 10 blade scalpel and using Bovie electrocautery taken down to the level of the fascia. This incision was extended above the umbilicus for appropriate retraction. Upon entry to the abdomen and the pneumoperitoneum was released. The bowel was able be run and the visualized tumor was noted to be hard and approximately 4-5 cm in greatest diameter. There were 2 separate palpable lymph nodes in the mesentery. The visualized attachments to the ascending colon appeared to be inflammatory in nature and came down easily with accommodation of blunt and sharp dissection. There were no palpable abnormalities within the colon. The NG tube was palpated within the stomach. No other abnormalities were noted along the entirety of the small bowel or the visualized portions of the large bowel.  Decision at this point was made to do a segmental resection. Approximately 10 cm proximal and distal to this palpable mass a mesenteric window was created and the bowel was cut with a Endo GIA blue load stapler. Using a LigaSure device the mesentery was taken down below the palpable lymph nodes along the entirety of the specimen passed off the field. There were areas of bleeding along the mesentery near the tumor which had to be suture ligated with 2-0 silk suture. With the specimen removed the small bowel was reanastomosed using a side-to-side functional end-to-end in  stable anastomosis. An additional fire of the 75 mm blue load GI stapler was used to create the anastomosis and then to close our enterotomy. The mesenteric defect was closed with a  running 3-0 silk suture. The enterotomy closure was Lemberted with interrupted 3-0 silk suture. The intestines were returned to the abdominal cavity and we proceeded to then transitioned to a clean and closed.  After remember of the operative team re-scrubbed and draped and all our entrance were exchanged for clean intermittent proceeded with a fascial closure. Using a running #1 PDS suture. The fascia was reapproximated from cephalad to caudad and then from caudad to cephalad and the sutures were tied together at the level the umbilicus. The skin was then reapproximated over this with surgical staples. The previous left upper quadrant trocar site was also closed with a surgical stapler. Both sites were then dressed with a honeycomb dressing. Patient tolerated the procedure well and was awakened from general anesthesia in the operating room without any difficulty. He was transferred to the PACU in good condition. There were no immediate Complications and all counts were correct at the end of the procedure.  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

## 2015-09-18 NOTE — Plan of Care (Signed)
Problem: Nutrition: Goal: Adequate nutrition will be maintained Outcome: Not Progressing Patient is NPO.   

## 2015-09-18 NOTE — Transfer of Care (Signed)
Immediate Anesthesia Transfer of Care Note  Patient: Zachary Robertson  Procedure(s) Performed: Procedure(s) with comments: EXPLORATORY LAPAROTOMY (N/A) LAPAROSCOPY DIAGNOSTIC SMALL BOWEL RESECTION - with anastomosis  Patient Location: PACU  Anesthesia Type:General  Level of Consciousness: sedated  Airway & Oxygen Therapy: Patient Spontanous Breathing and Patient connected to face mask oxygen  Post-op Assessment: Report given to RN and Post -op Vital signs reviewed and stable  Post vital signs: Reviewed and stable  Last Vitals:  Filed Vitals:   09/18/15 0420 09/18/15 1253  BP: 126/57 154/92  Pulse: 83 74  Temp: 36.9 C 36.7 C  Resp: 20 18    Last Pain:  Filed Vitals:   09/18/15 1256  PainSc: 0-No pain      Patients Stated Pain Goal: 3 ([id number])  Complications: No apparent anesthesia complications

## 2015-09-19 ENCOUNTER — Encounter: Payer: Self-pay | Admitting: General Surgery

## 2015-09-19 DIAGNOSIS — I10 Essential (primary) hypertension: Secondary | ICD-10-CM | POA: Diagnosis not present

## 2015-09-19 DIAGNOSIS — R1900 Intra-abdominal and pelvic swelling, mass and lump, unspecified site: Secondary | ICD-10-CM | POA: Diagnosis not present

## 2015-09-19 DIAGNOSIS — K5669 Other intestinal obstruction: Secondary | ICD-10-CM | POA: Diagnosis not present

## 2015-09-19 LAB — BASIC METABOLIC PANEL
Anion gap: 10 (ref 5–15)
BUN: 21 mg/dL — AB (ref 6–20)
CALCIUM: 8.7 mg/dL — AB (ref 8.9–10.3)
CO2: 27 mmol/L (ref 22–32)
CREATININE: 0.96 mg/dL (ref 0.61–1.24)
Chloride: 99 mmol/L — ABNORMAL LOW (ref 101–111)
GFR calc Af Amer: >60 mL/min (ref >60)
GFR calc non Af Amer: >60 mL/min (ref >60)
GLUCOSE: 114 mg/dL — AB (ref 65–99)
Potassium: 4.3 mmol/L (ref 3.5–5.1)
SODIUM: 136 mmol/L (ref 135–145)

## 2015-09-19 LAB — CBC
HCT: 34.9 % — ABNORMAL LOW (ref 40.0–52.0)
HEMOGLOBIN: 12.1 g/dL — AB (ref 13.0–18.0)
MCH: 30.6 pg (ref 26.0–34.0)
MCHC: 34.7 g/dL (ref 32.0–36.0)
MCV: 88.2 fL (ref 80.0–100.0)
PLATELETS: 189 10*3/uL (ref 150–440)
RBC: 3.95 MIL/uL — AB (ref 4.40–5.90)
RDW: 14.2 % (ref 11.5–14.5)
WBC: 11.4 10*3/uL — AB (ref 3.8–10.6)

## 2015-09-19 LAB — GLUCOSE, CAPILLARY: Glucose-Capillary: 97 mg/dL (ref 65–99)

## 2015-09-19 NOTE — Progress Notes (Signed)
1 Day Post-Op   Subjective:  Patient's postop day #1 status post export her laparotomy. Doing well. States his pain is well controlled and his primary complaint is from his NG tube in his nose. Does not think he has passed any flatus. Has not been out of bed yet.  Vital signs in last 24 hours: Temp:  [97.7 F (36.5 C)-98.6 F (37 C)] 98.5 F (36.9 C) (06/29 1211) Pulse Rate:  [63-84] 63 (06/29 1211) Resp:  [11-19] 19 (06/29 1211) BP: (139-167)/(57-75) 144/66 mmHg (06/29 1211) SpO2:  [94 %-100 %] 100 % (06/29 1211) Last BM Date: 09/16/15  Intake/Output from previous day: 06/28 0701 - 06/29 0700 In: 2942 [I.V.:2822; NG/GT:120] Out: 1340 [Urine:715; Emesis/NG output:575; Blood:50]  GI: Abdomen is large, soft, purple tender to palpation at incision sites. Honeycomb dressing in place to both incision sites without any evidence of erythema or purulence.  Lab Results:  CBC  Recent Labs  09/18/15 1742 09/19/15 0429  WBC 12.8* 11.4*  HGB 13.0 12.1*  HCT 38.7* 34.9*  PLT 192 189   CMP     Component Value Date/Time   NA 136 09/19/2015 0429   K 4.3 09/19/2015 0429   CL 99* 09/19/2015 0429   CO2 27 09/19/2015 0429   GLUCOSE 114* 09/19/2015 0429   BUN 21* 09/19/2015 0429   CREATININE 0.96 09/19/2015 0429   CALCIUM 8.7* 09/19/2015 0429   PROT 7.8 09/16/2015 1615   ALBUMIN 4.1 09/16/2015 1615   AST 24 09/16/2015 1615   ALT 27 09/16/2015 1615   ALKPHOS 88 09/16/2015 1615   BILITOT 0.6 09/16/2015 1615   GFRNONAA >60 09/19/2015 0429   GFRAA >60 09/19/2015 0429   PT/INR No results for input(s): LABPROT, INR in the last 72 hours.  Studies/Results: No results found.  Assessment/Plan: 75 year old male status post export her laparotomy for small bowel tumor. Doing well. Foley catheter removed today. Continue to trend NG tube output with hope for return of bowel function in the near future. We will remove NG tube once bowel function has improved. Encourage incentive spirometer  and ambulation up out of bed.   Clayburn Pert, MD FACS General Surgeon  09/19/2015

## 2015-09-19 NOTE — Consult Note (Signed)
   [facility] Inpatient Consult   09/19/2015  Zachary Robertson February 13, 1941 [id number]   Patient screened for potential [place] Management services. Patient is eligible for [place]. Electronic medical record reveals no identifiable [facility] care management needs at this time. [facility] Care Management services not appropriate at this time. If patient's post hospital needs change please place a [facility] Care Management consult. For questions please contact:   Enisa Runyan RN, [facility] Liaison  [phone]) [employer] [phone]) Toll free office

## 2015-09-19 NOTE — Progress Notes (Signed)
Patient ID: Zachary Robertson, male   DOB: 07/22/1940, 75 y.o.   MRN: [id number] [employer] [facility] PROGRESS NOTE  Zachary Robertson [id number] DOB: Jun 30, 1940 DOA: 09/16/2015 PCP: Ezequiel Kayser, MD  HPI/Subjective: Patient seen this morning. He had soreness in his abdomen. Offers no other complaints. As of this morning he did not pass gas yet.  Objective: Filed Vitals:   09/19/15 0816 09/19/15 1211  BP: 151/62 144/66  Pulse: 68 63  Temp: 98.2 F (36.8 C) 98.5 F (36.9 C)  Resp: 18 19    Filed Weights   09/16/15 2132 09/18/15 1253  Weight: 103.103 kg (227 lb 4.8 oz) 102.967 kg (227 lb)    ROS: Review of Systems  Constitutional: Negative for fever and chills.  Eyes: Negative for blurred vision.  Respiratory: Negative for cough and shortness of breath.   Cardiovascular: Negative for chest pain.  Gastrointestinal: Positive for abdominal pain. Negative for nausea, vomiting, diarrhea and constipation.  Genitourinary: Negative for dysuria.  Musculoskeletal: Negative for joint pain.  Neurological: Negative for dizziness and headaches.   Exam: Physical Exam  Constitutional: He is oriented to person, place, and time.  HENT:  Nose: No mucosal edema.  Mouth/Throat: No oropharyngeal exudate or posterior oropharyngeal edema.  Eyes: Conjunctivae, EOM and lids are normal. Pupils are equal, round, and reactive to light.  Neck: No JVD present. Carotid bruit is not present. No edema present. No thyroid mass and no thyromegaly present.  Cardiovascular: S1 normal and S2 normal.  Exam reveals no gallop.   No murmur heard. Pulses:      Dorsalis pedis pulses are 2+ on the right side, and 2+ on the left side.  Respiratory: No respiratory distress. He has no wheezes. He has no rhonchi. He has no rales.  GI: Soft. Bowel sounds are decreased. There is generalized tenderness.  Musculoskeletal:       Right ankle: He exhibits no swelling.       Left ankle: He exhibits no swelling.  Lymphadenopathy:   He has no cervical adenopathy.  Neurological: He is alert and oriented to person, place, and time. No cranial nerve deficit.  Skin: Skin is warm. No rash noted. Nails show no clubbing.  Psychiatric: He has a normal mood and affect.      Data Reviewed: Basic Metabolic Panel:  Recent Labs Lab 09/16/15 1615 09/17/15 0346 09/18/15 1742 09/19/15 0429  NA 133* 136  --  136  K 3.1* 3.5  --  4.3  CL 96* 95*  --  99*  CO2 27 31  --  27  GLUCOSE 111* 112*  --  114*  BUN 25* 23*  --  21*  CREATININE 1.10 1.06 1.01 0.96  CALCIUM 9.7 9.3  --  8.7*   Liver Function Tests:  Recent Labs Lab 09/16/15 1615  AST 24  ALT 27  ALKPHOS 88  BILITOT 0.6  PROT 7.8  ALBUMIN 4.1    Recent Labs Lab 09/16/15 1615  LIPASE 42   CBC:  Recent Labs Lab 09/16/15 1615 09/17/15 0346 09/18/15 1742 09/19/15 0429  WBC 8.8 9.4 12.8* 11.4*  HGB 13.1 12.8* 13.0 12.1*  HCT 36.6* 35.9* 38.7* 34.9*  MCV 87.2 87.3 89.3 88.2  PLT 223 213 192 189    CBG:  Recent Labs Lab 09/18/15 2100  GLUCAP 134*    Recent Results (from the past 240 hour(s))  Surgical pcr screen     Status: None   Collection Time: 09/18/15  1:46 AM  Result Value Ref  Range Status   MRSA, PCR NEGATIVE NEGATIVE Final   Staphylococcus aureus NEGATIVE NEGATIVE Final    Comment:        The Xpert SA Assay (FDA approved for NASAL specimens in patients over 12 years of age), is one component of a comprehensive surveillance program.  Test performance has been validated by [facility] for patients greater than or equal to 55 year old. It is not intended to diagnose infection nor to guide or monitor treatment.       Scheduled Meds: . enoxaparin (LOVENOX) injection  40 mg Subcutaneous Q24H  . latanoprost  1 drop Both Eyes BH-q7a  . timolol  1 drop Both Eyes BH-q7a   Continuous Infusions: . lactated ringers 125 mL/hr at 09/19/15 0615    Assessment/Plan:  1. Essential hypertension. Off medications with NG tube.  Blood pressure acceptable. 2. Mass in the bowel and bowel structure and. Status post surgery. Doing well postoperative day one. Pain control with when necessary medications. 3. Glaucoma unspecified continue latanoprost and timolol 4. 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 Restart finasteride once NG tube out.  Code Status:     Code Status Orders        Start     Ordered   09/16/15 2101  Full code   Continuous     09/16/15 2100    Code Status History    Date Active Date Inactive Code Status Order ID Comments User Context   06/16/2015  3:44 PM 06/20/2015  3:51 PM Full Code [id number]  Clayburn Pert, MD Inpatient    Advance Directive Documentation        Most Recent Value   Type of Advance Directive  Healthcare Power of Attorney   Pre-existing out of facility DNR order (yellow form or pink MOST form)     "MOST" Form in Place?       Disposition Plan: Usually takes 3-5 days after surgery to go home.  Consultants:  Gen. surgery  Time spent: 25 minutes  [place], [place]

## 2015-09-20 ENCOUNTER — Telehealth: Payer: Self-pay

## 2015-09-20 DIAGNOSIS — K5669 Other intestinal obstruction: Secondary | ICD-10-CM | POA: Diagnosis not present

## 2015-09-20 DIAGNOSIS — R1900 Intra-abdominal and pelvic swelling, mass and lump, unspecified site: Secondary | ICD-10-CM | POA: Diagnosis not present

## 2015-09-20 DIAGNOSIS — I10 Essential (primary) hypertension: Secondary | ICD-10-CM | POA: Diagnosis not present

## 2015-09-20 LAB — CBC
HEMATOCRIT: 32.6 % — AB (ref 40.0–52.0)
Hemoglobin: 11.4 g/dL — ABNORMAL LOW (ref 13.0–18.0)
MCH: 30.8 pg (ref 26.0–34.0)
MCHC: 35 g/dL (ref 32.0–36.0)
MCV: 87.9 fL (ref 80.0–100.0)
Platelets: 176 10*3/uL (ref 150–440)
RBC: 3.71 MIL/uL — AB (ref 4.40–5.90)
RDW: 14.3 % (ref 11.5–14.5)
WBC: 9.5 10*3/uL (ref 3.8–10.6)

## 2015-09-20 LAB — BASIC METABOLIC PANEL
ANION GAP: 9 (ref 5–15)
BUN: 22 mg/dL — ABNORMAL HIGH (ref 6–20)
CALCIUM: 8.5 mg/dL — AB (ref 8.9–10.3)
CO2: 28 mmol/L (ref 22–32)
Chloride: 99 mmol/L — ABNORMAL LOW (ref 101–111)
Creatinine, Ser: 0.67 mg/dL (ref 0.61–1.24)
GLUCOSE: 90 mg/dL (ref 65–99)
POTASSIUM: 3.7 mmol/L (ref 3.5–5.1)
Sodium: 136 mmol/L (ref 135–145)

## 2015-09-20 NOTE — Care Management (Signed)
Patient has been assessed by PT.  No home health needs identified at this time. RNCM available if needed.

## 2015-09-20 NOTE — Telephone Encounter (Signed)
Patient had surgery on 09/18/2015 by Dr. Celestia Khat laparotomy.

## 2015-09-20 NOTE — Progress Notes (Signed)
Patient ID: HEMANT LAFON, male   DOB: [birth year], 75 y.o.   MRN: [id number] [employer] PROGRESS NOTE  SMYAN EHRMAN [id number] DOB: March 21, 1941 DOA: 09/16/2015 PCP: Ezequiel Kayser, MD  HPI/Subjective: Patient having abdominal pain described as a soreness. Wanting to have something to drink soon  Objective: Filed Vitals:   09/20/15 0557 09/20/15 1244  BP: 153/61 141/72  Pulse: 73 68  Temp: 98.3 F (36.8 C) 98 F (36.7 C)  Resp: 17 17    Filed Weights   09/16/15 2132 09/18/15 1253  Weight: 103.103 kg (227 lb 4.8 oz) 102.967 kg (227 lb)    ROS: Review of Systems  Constitutional: Negative for fever and chills.  Eyes: Negative for blurred vision.  Respiratory: Negative for cough and shortness of breath.   Cardiovascular: Negative for chest pain.  Gastrointestinal: Positive for abdominal pain. Negative for nausea, vomiting, diarrhea and constipation.  Genitourinary: Negative for dysuria.  Musculoskeletal: Negative for joint pain.  Neurological: Negative for dizziness and headaches.   Exam: Physical Exam  Constitutional: He is oriented to person, place, and time.  HENT:  Nose: No mucosal edema.  Mouth/Throat: No oropharyngeal exudate or posterior oropharyngeal edema.  Eyes: Conjunctivae, EOM and lids are normal. Pupils are equal, round, and reactive to light.  Neck: No JVD present. Carotid bruit is not present. No edema present. No thyroid mass and no thyromegaly present.  Cardiovascular: S1 normal and S2 normal.  Exam reveals no gallop.   No murmur heard. Pulses:      Dorsalis pedis pulses are 2+ on the right side, and 2+ on the left side.  Respiratory: No respiratory distress. He has no wheezes. He has no rhonchi. He has no rales.  GI: Soft. Bowel sounds are decreased. There is generalized tenderness.  Musculoskeletal:       Right ankle: He exhibits no swelling.       Left ankle: He exhibits no swelling.  Lymphadenopathy:    He has no cervical adenopathy.   Neurological: He is alert and oriented to person, place, and time. No cranial nerve deficit.  Skin: Skin is warm. No rash noted. Nails show no clubbing.  Psychiatric: He has a normal mood and affect.      Data Reviewed: Basic Metabolic Panel:  Recent Labs Lab 09/16/15 1615 09/17/15 0346 09/18/15 1742 09/19/15 0429 09/20/15 0417  NA 133* 136  --  136 136  K 3.1* 3.5  --  4.3 3.7  CL 96* 95*  --  99* 99*  CO2 27 31  --  27 28  GLUCOSE 111* 112*  --  114* 90  BUN 25* 23*  --  21* 22*  CREATININE 1.10 1.06 1.01 0.96 0.67  CALCIUM 9.7 9.3  --  8.7* 8.5*   Liver Function Tests:  Recent Labs Lab 09/16/15 1615  AST 24  ALT 27  ALKPHOS 88  BILITOT 0.6  PROT 7.8  ALBUMIN 4.1    Recent Labs Lab 09/16/15 1615  LIPASE 42   CBC:  Recent Labs Lab 09/16/15 1615 09/17/15 0346 09/18/15 1742 09/19/15 0429 09/20/15 0417  WBC 8.8 9.4 12.8* 11.4* 9.5  HGB 13.1 12.8* 13.0 12.1* 11.4*  HCT 36.6* 35.9* 38.7* 34.9* 32.6*  MCV 87.2 87.3 89.3 88.2 87.9  PLT 223 213 192 189 176    CBG:  Recent Labs Lab 09/18/15 2100 09/19/15 2122  GLUCAP 134* 97    Recent Results (from the past 240 hour(s))  Surgical pcr screen     Status:  None   Collection Time: 09/18/15  1:46 AM  Result Value Ref Range Status   MRSA, PCR NEGATIVE NEGATIVE Final   Staphylococcus aureus NEGATIVE NEGATIVE Final    Comment:        The Xpert SA Assay (FDA approved for NASAL specimens in patients over 39 years of age), is one component of a comprehensive surveillance program.  Test performance has been validated by [facility] for patients greater than or equal to 13 year old. It is not intended to diagnose infection nor to guide or monitor treatment.       Scheduled Meds: . enoxaparin (LOVENOX) injection  40 mg Subcutaneous Q24H  . latanoprost  1 drop Both Eyes BH-q7a  . timolol  1 drop Both Eyes BH-q7a   Continuous Infusions: . lactated ringers 125 mL/hr at 09/20/15 0855     Assessment/Plan:  1. Essential hypertension. Off medications with NG tube. Blood pressure acceptable. 2. Mass in the bowel and bowel structure and. Status post surgery. Doing well postoperative day one. Pain control with when necessary medications. 3. Glaucoma unspecified continue latanoprost and timolol 4. 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 Restart finasteride once NG tube out.  Code Status:     Code Status Orders        Start     Ordered   09/16/15 2101  Full code   Continuous     09/16/15 2100    Code Status History    Date Active Date Inactive Code Status Order ID Comments User Context   06/16/2015  3:44 PM 06/20/2015  3:51 PM Full Code [id number]  Clayburn Pert, MD Inpatient    Advance Directive Documentation        Most Recent Value   Type of Advance Directive  Healthcare Power of Attorney   Pre-existing out of facility DNR order (yellow form or pink MOST form)     "MOST" Form in Place?       Disposition Plan: Will need to tolerate diet and have a bowel movement prior to disposition  Consultants:  Gen. surgery  Time spent: 24 minutes  [place], [place]

## 2015-09-20 NOTE — Progress Notes (Signed)
2 Days Post-Op   Subjective:  Patient reports feeling well this morning. He thinks he may have passed some flatus but is unsure. Denies any fevers, chills, nausea, vomiting. Pain has been well-controlled.  Vital signs in last 24 hours: Temp:  [98.3 F (36.8 C)-98.5 F (36.9 C)] 98.3 F (36.8 C) (06/30 0557) Pulse Rate:  [63-80] 73 (06/30 0557) Resp:  [17-19] 17 (06/30 0557) BP: (142-153)/(60-66) 153/61 mmHg (06/30 0557) SpO2:  [99 %-100 %] 99 % (06/30 0557) Last BM Date: 09/16/15  Intake/Output from previous day: 06/29 0701 - 06/30 0700 In: 3362 [I.V.:3192; NG/GT:170] Out: 1885 [Urine:1060; Emesis/NG output:825]  GI: Abdomen soft, purple tender to palpation along the incision site, nondistended. Honeycomb dressing in place without any evidence of purulent drainage or spreading erythema.  Lab Results:  CBC  Recent Labs  09/19/15 0429 09/20/15 0417  WBC 11.4* 9.5  HGB 12.1* 11.4*  HCT 34.9* 32.6*  PLT 189 176   CMP     Component Value Date/Time   NA 136 09/20/2015 0417   K 3.7 09/20/2015 0417   CL 99* 09/20/2015 0417   CO2 28 09/20/2015 0417   GLUCOSE 90 09/20/2015 0417   BUN 22* 09/20/2015 0417   CREATININE 0.67 09/20/2015 0417   CALCIUM 8.5* 09/20/2015 0417   PROT 7.8 09/16/2015 1615   ALBUMIN 4.1 09/16/2015 1615   AST 24 09/16/2015 1615   ALT 27 09/16/2015 1615   ALKPHOS 88 09/16/2015 1615   BILITOT 0.6 09/16/2015 1615   GFRNONAA >60 09/20/2015 0417   GFRAA >60 09/20/2015 0417   PT/INR No results for input(s): LABPROT, INR in the last 72 hours.  Studies/Results: No results found.  Assessment/Plan: 75 year old male postop day #2 from export her laparotomy for a small bowel tumor. Recovering well. Patient started on NG tube clamp trial today due to inability to keep adequate ins and outs by the floor. Discussed with patient that we would perform his first dressing change later today. If he tolerates his NG tube clamp trial with minimal output later today  his NG tube will be removed. Encourage ambulation and incentive from her usage.   Clayburn Pert, MD FACS General Surgeon  09/20/2015

## 2015-09-20 NOTE — Progress Notes (Signed)
Oncoming RN notified MD of offgoing RN report of pt NG output. Since last marked on canister at 0400 up until 0700, patient had NG tube output of 200.   Upon reviewing orders, Primary RN communicated to MD of patient NG tube output, acknowledged. Primary RN addressed situation with off-going RN. Pt resting, continue to assess.

## 2015-09-20 NOTE — Evaluation (Signed)
Physical Therapy Evaluation Patient Details Name: Zachary Robertson MRN: [id number] DOB: Jan 24, 1941 Today's Date: 09/20/2015   History of Present Illness  75 yo M was admitted to the hospital for recurrent SBO. 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   Clinical Impression  Pt demonstrated no significant functional deficits during evaluation. He is very close to baseline LOF.B LE strength 5/5. Pt is independent with all mobility and presents with no balance deficits. He ambulated 400 ft without AD independently. Pt was slightly tired. Education provided for daily walking program up to a total of at least 30 minutes per day in tolerable increments to improve endurance. Pt has no needs for skilled PT at this time and will be discharged in house. He can continue ambulation with nursing staff. Current orders will be completed and new orders will be required if a need arises in the future.    Follow Up Recommendations No PT follow up    Equipment Recommendations  None recommended by PT    Recommendations for Other Services       Precautions / Restrictions Precautions Precautions: None Restrictions Weight Bearing Restrictions: No      Mobility  Bed Mobility               General bed mobility comments: up walking with RN, NT  Transfers Overall transfer level: Independent Equipment used: None             General transfer comment: no difficulties, steady with no LOB  Ambulation/Gait Ambulation/Gait assistance: Independent Ambulation Distance (Feet): 400 Feet Assistive device: None   Gait velocity: 1.8 ft/sec Gait velocity interpretation: at or above normal speed for age/gender General Gait Details: Steady with good speed. No LOB.   Stairs            Wheelchair Mobility    Modified Rankin (Stroke Patients Only)       Balance Overall balance assessment: No apparent balance deficits (not formally  assessed);Independent                                           Pertinent Vitals/Pain Pain Assessment: No/denies pain ("just soreness")    Home Living Family/patient expects to be discharged to:: Private residence Living Arrangements: Alone Available Help at Discharge: Family (pt has a niece that lives near him an is a Marine scientist, and 2 sons) Type of Home: House Home Access: Level entry     Home Layout: One level Home Equipment: None      Prior Function Level of Independence: Independent         Comments: Pt I with all ADLs, drives, community ambulation without AD, plays golf and works in the yard     Journalist, newspaper        Extremity/Trunk Assessment   Upper Extremity Assessment: Overall WFL for tasks assessed           Lower Extremity Assessment: Overall WFL for tasks assessed         Communication   Communication: No difficulties  Cognition Arousal/Alertness: Awake/alert Behavior During Therapy: WFL for tasks assessed/performed Overall Cognitive Status: Within Functional Limits for tasks assessed                      General Comments General comments (skin integrity, edema, etc.): NG tube  Exercises        Assessment/Plan    PT Assessment Patent does not need any further PT services  PT Diagnosis     PT Problem List    PT Treatment Interventions     PT Goals (Current goals can be found in the Care Plan section) Acute Rehab PT Goals Patient Stated Goal: to get home PT Goal Formulation: With patient Time For Goal Achievement: 10/04/15 Potential to Achieve Goals: Good    Frequency     Barriers to discharge        Co-evaluation               End of Session   Activity Tolerance: Patient tolerated treatment well Patient left: in chair;with call bell/phone within reach Nurse Communication: Mobility status         Time: [id number] PT Time Calculation (min) (ACUTE ONLY): 11 min   Charges:   PT  Evaluation $PT Eval Low Complexity: 1 Procedure     PT G Codes:        Neoma Laming, PT, DPT  09/20/2015, 2:41 PM [phone]

## 2015-09-20 NOTE — Care Management Important Message (Signed)
Important Message  Patient Details  Name: Zachary Robertson MRN: [id number] Date of Birth: November 17, 1940   Medicare Important Message Given:  Yes    [name] 09/20/2015, 1:03 PM

## 2015-09-21 DIAGNOSIS — K5669 Other intestinal obstruction: Secondary | ICD-10-CM | POA: Diagnosis not present

## 2015-09-21 DIAGNOSIS — N400 Enlarged prostate without lower urinary tract symptoms: Secondary | ICD-10-CM | POA: Diagnosis not present

## 2015-09-21 DIAGNOSIS — R1900 Intra-abdominal and pelvic swelling, mass and lump, unspecified site: Secondary | ICD-10-CM | POA: Diagnosis not present

## 2015-09-21 DIAGNOSIS — I10 Essential (primary) hypertension: Secondary | ICD-10-CM | POA: Diagnosis not present

## 2015-09-21 MED ORDER — FINASTERIDE 5 MG PO TABS
5.0000 mg | ORAL_TABLET | Freq: Every day | ORAL | Status: DC
  Administered 2015-09-21 – 2015-09-23 (×3): 5 mg via ORAL
  Filled 2015-09-21 (×3): qty 1

## 2015-09-21 MED ORDER — OXYCODONE HCL 5 MG PO TABS: 5.0000 mg | ORAL_TABLET | Freq: Four times a day (QID) | ORAL | Status: DC | PRN

## 2015-09-21 MED ORDER — HYDROCHLOROTHIAZIDE 12.5 MG PO CAPS
12.5000 mg | ORAL_CAPSULE | Freq: Every day | ORAL | Status: DC
  Administered 2015-09-21 – 2015-09-23 (×3): 12.5 mg via ORAL
  Filled 2015-09-21 (×3): qty 1

## 2015-09-21 MED ORDER — LISINOPRIL 10 MG PO TABS
10.0000 mg | ORAL_TABLET | Freq: Every day | ORAL | Status: DC
  Administered 2015-09-21 – 2015-09-23 (×3): 10 mg via ORAL
  Filled 2015-09-21 (×3): qty 1

## 2015-09-21 NOTE — Progress Notes (Addendum)
Patient ID: Zachary Robertson, male   DOB: [birth year], 75 y.o.   MRN: [id number] [facility] PROGRESS NOTE  ZAYLIN DYESS [id number] DOB: 1940/10/22 DOA: 09/16/2015 PCP: Ezequiel Kayser, MD  HPI/Subjective: Patient having abdominal pain described as a soreness. Patient states that he did not take any pain medication.  Objective: Filed Vitals:   09/21/15 0505 09/21/15 0708  BP: 184/82 157/74  Pulse: 77 75  Temp: 98.5 F (36.9 C)   Resp: 18     Filed Weights   09/16/15 2132 09/18/15 1253  Weight: 103.103 kg (227 lb 4.8 oz) 102.967 kg (227 lb)    ROS: Review of Systems  Constitutional: Negative for fever and chills.  Eyes: Negative for blurred vision.  Respiratory: Negative for cough and shortness of breath.   Cardiovascular: Negative for chest pain.  Gastrointestinal: Positive for abdominal pain. Negative for nausea, vomiting, diarrhea and constipation.  Genitourinary: Negative for dysuria.  Musculoskeletal: Negative for joint pain.  Neurological: Negative for dizziness and headaches.   Exam: Physical Exam  Constitutional: He is oriented to person, place, and time.  HENT:  Nose: No mucosal edema.  Mouth/Throat: No oropharyngeal exudate or posterior oropharyngeal edema.  Eyes: Conjunctivae, EOM and lids are normal. Pupils are equal, round, and reactive to light.  Neck: No JVD present. Carotid bruit is not present. No edema present. No thyroid mass and no thyromegaly present.  Cardiovascular: S1 normal and S2 normal.  Exam reveals no gallop.   No murmur heard. Pulses:      Dorsalis pedis pulses are 2+ on the right side, and 2+ on the left side.  Respiratory: No respiratory distress. He has no wheezes. He has no rhonchi. He has no rales.  GI: Soft. Bowel sounds are decreased. There is generalized tenderness.  Musculoskeletal:       Right ankle: He exhibits no swelling.       Left ankle: He exhibits no swelling.  Lymphadenopathy:    He has no cervical adenopathy.   Neurological: He is alert and oriented to person, place, and time. No cranial nerve deficit.  Skin: Skin is warm. No rash noted. Nails show no clubbing.  Psychiatric: He has a normal mood and affect.      Data Reviewed: Basic Metabolic Panel:  Recent Labs Lab 09/16/15 1615 09/17/15 0346 09/18/15 1742 09/19/15 0429 09/20/15 0417  NA 133* 136  --  136 136  K 3.1* 3.5  --  4.3 3.7  CL 96* 95*  --  99* 99*  CO2 27 31  --  27 28  GLUCOSE 111* 112*  --  114* 90  BUN 25* 23*  --  21* 22*  CREATININE 1.10 1.06 1.01 0.96 0.67  CALCIUM 9.7 9.3  --  8.7* 8.5*   Liver Function Tests:  Recent Labs Lab 09/16/15 1615  AST 24  ALT 27  ALKPHOS 88  BILITOT 0.6  PROT 7.8  ALBUMIN 4.1    Recent Labs Lab 09/16/15 1615  LIPASE 42   CBC:  Recent Labs Lab 09/16/15 1615 09/17/15 0346 09/18/15 1742 09/19/15 0429 09/20/15 0417  WBC 8.8 9.4 12.8* 11.4* 9.5  HGB 13.1 12.8* 13.0 12.1* 11.4*  HCT 36.6* 35.9* 38.7* 34.9* 32.6*  MCV 87.2 87.3 89.3 88.2 87.9  PLT 223 213 192 189 176    CBG:  Recent Labs Lab 09/18/15 2100 09/19/15 2122  GLUCAP 134* 97    Recent Results (from the past 240 hour(s))  Surgical pcr screen     Status:  None   Collection Time: 09/18/15  1:46 AM  Result Value Ref Range Status   MRSA, PCR NEGATIVE NEGATIVE Final   Staphylococcus aureus NEGATIVE NEGATIVE Final    Comment:        The Xpert SA Assay (FDA approved for NASAL specimens in patients over 80 years of age), is one component of a comprehensive surveillance program.  Test performance has been validated by [facility] for patients greater than or equal to 38 year old. It is not intended to diagnose infection nor to guide or monitor treatment.       Scheduled Meds: . enoxaparin (LOVENOX) injection  40 mg Subcutaneous Q24H  . latanoprost  1 drop Both Eyes BH-q7a  . timolol  1 drop Both Eyes BH-q7a   Continuous Infusions: . lactated ringers 125 mL/hr at 09/21/15 1029     Assessment/Plan:  1. Essential hypertension. Restart oral hypertensive medication 2. 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 Status post surgery. Pain control with when necessary medications. Tolerating liquid diet. 3. Glaucoma unspecified continue latanoprost and timolol 4. 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 Restart finasteride.  Code Status:     Code Status Orders        Start     Ordered   09/16/15 2101  Full code   Continuous     09/16/15 2100    Code Status History    Date Active Date Inactive Code Status Order ID Comments User Context   06/16/2015  3:44 PM 06/20/2015  3:51 PM Full Code [id number]  Clayburn Pert, MD Inpatient    Advance Directive Documentation        Most Recent Value   Type of Advance Directive  Healthcare Power of Attorney   Pre-existing out of facility DNR order (yellow form or pink MOST form)     "MOST" Form in Place?       Disposition Plan: Will need to tolerate diet and have a bowel movement prior to disposition.  Consultants:  Gen. surgery  Time spent: 59 minutes  [place], [place]

## 2015-09-21 NOTE — Progress Notes (Signed)
3 Days Post-Op   Subjective:  Patient reports feeling ok this morning. Having some nausea, no emesis Pain has been well-controlled.  Vital signs in last 24 hours: Temp:  [98 F (36.7 C)-98.8 F (37.1 C)] 98.5 F (36.9 C) (07/01 0505) Pulse Rate:  [68-77] 75 (07/01 0708) Resp:  [17-18] 18 (07/01 0505) BP: (141-184)/(72-82) 157/74 mmHg (07/01 0708) SpO2:  [97 %-100 %] 98 % (07/01 0505) Last BM Date: 09/16/15  Intake/Output from previous day: 06/30 0701 - 07/01 0700 In: 2920.7 [I.V.:2920.7] Out: 1025 [Urine:925; Emesis/NG output:100]  GI: Abdomen soft, appropriately tender to palpation along the incision site, nondistended. Honeycomb dressing in place without any evidence of purulent drainage or spreading erythema.  Lab Results:  CBC  Recent Labs  09/19/15 0429 09/20/15 0417  WBC 11.4* 9.5  HGB 12.1* 11.4*  HCT 34.9* 32.6*  PLT 189 176   CMP     Component Value Date/Time   NA 136 09/20/2015 0417   K 3.7 09/20/2015 0417   CL 99* 09/20/2015 0417   CO2 28 09/20/2015 0417   GLUCOSE 90 09/20/2015 0417   BUN 22* 09/20/2015 0417   CREATININE 0.67 09/20/2015 0417   CALCIUM 8.5* 09/20/2015 0417   PROT 7.8 09/16/2015 1615   ALBUMIN 4.1 09/16/2015 1615   AST 24 09/16/2015 1615   ALT 27 09/16/2015 1615   ALKPHOS 88 09/16/2015 1615   BILITOT 0.6 09/16/2015 1615   GFRNONAA >60 09/20/2015 0417   GFRAA >60 09/20/2015 0417   PT/INR No results for input(s): LABPROT, INR in the last 72 hours.  Studies/Results: No results found.  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 Recovering well.  Encourage ambulation and incentive from her usage.  Will keep on liquids for now, if starts having more consistent bowel function may advance   Mali Zorianna Taliaferro MD  09/21/2015

## 2015-09-22 DIAGNOSIS — K5669 Other intestinal obstruction: Secondary | ICD-10-CM | POA: Diagnosis not present

## 2015-09-22 DIAGNOSIS — R1900 Intra-abdominal and pelvic swelling, mass and lump, unspecified site: Secondary | ICD-10-CM | POA: Diagnosis not present

## 2015-09-22 DIAGNOSIS — I10 Essential (primary) hypertension: Secondary | ICD-10-CM | POA: Diagnosis not present

## 2015-09-22 DIAGNOSIS — N400 Enlarged prostate without lower urinary tract symptoms: Secondary | ICD-10-CM | POA: Diagnosis not present

## 2015-09-22 MED ORDER — OXYMETAZOLINE HCL 0.05 % NA SOLN
4.0000 | Freq: Once | NASAL | Status: AC
  Administered 2015-09-22: 4 via NASAL
  Filled 2015-09-22: qty 15

## 2015-09-22 NOTE — Progress Notes (Signed)
Patient ID: Zachary Robertson, male   DOB: 07-03-1940, 75 y.o.   MRN: [id number] [facility] PROGRESS NOTE  Zachary Robertson DOB: January 25, 1941 DOA: 09/16/2015 PCP: Ezequiel Kayser, MD  HPI/Subjective: Patient feeling some soreness in the abdomen. He passed some gas last night. Had a bowel movement.  Objective: Filed Vitals:   09/21/15 2045 09/22/15 0615  BP: 155/85 153/76  Pulse: 77 82  Temp: 98.7 F (37.1 C) 98.2 F (36.8 C)  Resp: 20 16    Filed Weights   09/16/15 2132 09/18/15 1253  Weight: 103.103 kg (227 lb 4.8 oz) 102.967 kg (227 lb)    ROS: Review of Systems  Constitutional: Negative for fever and chills.  Eyes: Negative for blurred vision.  Respiratory: Negative for cough and shortness of breath.   Cardiovascular: Negative for chest pain.  Gastrointestinal: Positive for abdominal pain. Negative for nausea, vomiting, diarrhea and constipation.  Genitourinary: Negative for dysuria.  Musculoskeletal: Negative for joint pain.  Neurological: Negative for dizziness and headaches.   Exam: Physical Exam  HENT:  Nose: No mucosal edema.  Mouth/Throat: No oropharyngeal exudate or posterior oropharyngeal edema.  Eyes: Conjunctivae, EOM and lids are normal. Pupils are equal, round, and reactive to light.  Neck: No JVD present. Carotid bruit is not present. No edema present. No thyroid mass and no thyromegaly present.  Cardiovascular: S1 normal and S2 normal.  Exam reveals no gallop.   No murmur heard. Pulses:      Dorsalis pedis pulses are 2+ on the right side, and 2+ on the left side.  Respiratory: No respiratory distress. He has no wheezes. He has no rhonchi. He has no rales.  GI: Soft. Bowel sounds are normal. He exhibits distension. There is generalized tenderness.  Musculoskeletal:       Right shoulder: He exhibits no swelling.  Lymphadenopathy:    He has no cervical adenopathy.  Neurological: He is alert. No cranial nerve deficit.  Skin: Skin is warm. No  rash noted. Nails show no clubbing.  Psychiatric: He has a normal mood and affect.      Data Reviewed: Basic Metabolic Panel:  Recent Labs Lab 09/16/15 1615 09/17/15 0346 09/18/15 1742 09/19/15 0429 09/20/15 0417  NA 133* 136  --  136 136  K 3.1* 3.5  --  4.3 3.7  CL 96* 95*  --  99* 99*  CO2 27 31  --  27 28  GLUCOSE 111* 112*  --  114* 90  BUN 25* 23*  --  21* 22*  CREATININE 1.10 1.06 1.01 0.96 0.67  CALCIUM 9.7 9.3  --  8.7* 8.5*   Liver Function Tests:  Recent Labs Lab 09/16/15 1615  AST 24  ALT 27  ALKPHOS 88  BILITOT 0.6  PROT 7.8  ALBUMIN 4.1    Recent Labs Lab 09/16/15 1615  LIPASE 42   CBC:  Recent Labs Lab 09/16/15 1615 09/17/15 0346 09/18/15 1742 09/19/15 0429 09/20/15 0417  WBC 8.8 9.4 12.8* 11.4* 9.5  HGB 13.1 12.8* 13.0 12.1* 11.4*  HCT 36.6* 35.9* 38.7* 34.9* 32.6*  MCV 87.2 87.3 89.3 88.2 87.9  PLT 223 213 192 189 176     Recent Results (from the past 240 hour(s))  Surgical pcr screen     Status: None   Collection Time: 09/18/15  1:46 AM  Result Value Ref Range Status   MRSA, PCR NEGATIVE NEGATIVE Final   Staphylococcus aureus NEGATIVE NEGATIVE Final    Comment:  The Xpert SA Assay (FDA approved for NASAL specimens in patients over 67 years of age), is one component of a comprehensive surveillance program.  Test performance has been validated by [facility] for patients greater than or equal to 74 year old. It is not intended to diagnose infection nor to guide or monitor treatment.      Scheduled Meds: . enoxaparin (LOVENOX) injection  40 mg Subcutaneous Q24H  . finasteride  5 mg Oral Daily  . hydrochlorothiazide  12.5 mg Oral Daily  . latanoprost  1 drop Both Eyes BH-q7a  . lisinopril  10 mg Oral Daily  . timolol  1 drop Both Eyes BH-q7a    Assessment/Plan:  1. Essential hypertension. Restarted hydrochlorothiazide and lisinopril. Stop IV fluids. 2. 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 Status post  surgery. Patient doing well. Passed gas last night and had a small bowel movement today. Diet being advanced by surgery. Potential home tomorrow if does well. Still waiting for final pathology results. 3. Glaucoma unspecified continue with Catapres and timolol 4. 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 On finasteride.  Code Status:     Code Status Orders        Start     Ordered   09/16/15 2101  Full code   Continuous     09/16/15 2100    Code Status History    Date Active Date Inactive Code Status Order ID Comments User Context   06/16/2015  3:44 PM 06/20/2015  3:51 PM Full Code [id number]  Clayburn Pert, MD Inpatient    Advance Directive Documentation        Most Recent Value   Type of Advance Directive  Healthcare Power of Attorney   Pre-existing out of facility DNR order (yellow form or pink MOST form)     "MOST" Form in Place?       Disposition Plan: Potentially home tomorrow as per surgery  Time spent: 22 minutes  [place], [place]

## 2015-09-22 NOTE — Progress Notes (Signed)
4 Days Post-Op   Subjective:  Patient reports feeling well this morning.Had BM and significant flatus, tolerating liquids Pain has been well-controlled.  Vital signs in last 24 hours: Temp:  [98.2 F (36.8 C)-98.7 F (37.1 C)] 98.2 F (36.8 C) (07/02 0615) Pulse Rate:  [77-82] 82 (07/02 0615) Resp:  [16-20] 16 (07/02 0615) BP: (152-155)/(72-85) 153/76 mmHg (07/02 0615) SpO2:  [96 %-97 %] 96 % (07/02 0615) Last BM Date: 09/21/15  Intake/Output from previous day: 07/01 0701 - 07/02 0700 In: 3328.2 [P.O.:840; I.V.:2488.2] Out: 900 [Urine:900]  GI: Abdomen soft, appropriately tender to palpation along the incision site, nondistended. Honeycomb dressing in place without any evidence of purulent drainage or spreading erythema.  Lab Results:  CBC  Recent Labs  09/20/15 0417  WBC 9.5  HGB 11.4*  HCT 32.6*  PLT 176   CMP     Component Value Date/Time   NA 136 09/20/2015 0417   K 3.7 09/20/2015 0417   CL 99* 09/20/2015 0417   CO2 28 09/20/2015 0417   GLUCOSE 90 09/20/2015 0417   BUN 22* 09/20/2015 0417   CREATININE 0.67 09/20/2015 0417   CALCIUM 8.5* 09/20/2015 0417   PROT 7.8 09/16/2015 1615   ALBUMIN 4.1 09/16/2015 1615   AST 24 09/16/2015 1615   ALT 27 09/16/2015 1615   ALKPHOS 88 09/16/2015 1615   BILITOT 0.6 09/16/2015 1615   GFRNONAA >60 09/20/2015 0417   GFRAA >60 09/20/2015 0417   PT/INR No results for input(s): LABPROT, INR in the last 72 hours.  Studies/Results: No results found.  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 Recovering well.  Encourage ambulation and incentive from her usage.  ADAT  Mali Taran Hable MD  09/22/2015

## 2015-09-22 NOTE — Progress Notes (Signed)
Pt had nosebleed incident, when he sniffed, blood mixed with mucous in his throat and he coughed it up; 1 medium sized clot; notified Dr Leslye Peer via text page

## 2015-09-23 DIAGNOSIS — I10 Essential (primary) hypertension: Secondary | ICD-10-CM | POA: Diagnosis not present

## 2015-09-23 DIAGNOSIS — R1900 Intra-abdominal and pelvic swelling, mass and lump, unspecified site: Secondary | ICD-10-CM | POA: Diagnosis not present

## 2015-09-23 DIAGNOSIS — K5669 Other intestinal obstruction: Secondary | ICD-10-CM | POA: Diagnosis not present

## 2015-09-23 DIAGNOSIS — N400 Enlarged prostate without lower urinary tract symptoms: Secondary | ICD-10-CM | POA: Diagnosis not present

## 2015-09-23 MED ORDER — LISINOPRIL-HYDROCHLOROTHIAZIDE 10-12.5 MG PO TABS
1.0000 | ORAL_TABLET | Freq: Every day | ORAL | Status: AC
Start: 2015-09-23 — End: 2023-09-20

## 2015-09-23 MED ORDER — OXYCODONE HCL 5 MG PO TABS: 5.0000 mg | ORAL_TABLET | Freq: Four times a day (QID) | ORAL | Status: DC | PRN

## 2015-09-23 MED ORDER — OXYMETAZOLINE HCL 0.05 % NA SOLN
2.0000 | Freq: Once | NASAL | Status: AC
  Administered 2015-09-23: 2 via NASAL
  Filled 2015-09-23: qty 15

## 2015-09-23 NOTE — Discharge Summary (Signed)
[facility] [place] NAME: Zachary Robertson    MR#:  [id number]  DATE OF BIRTH:  04-Apr-1940  DATE OF ADMISSION:  09/16/2015 ADMITTING PHYSICIAN: Loletha Grayer, MD  DATE OF DISCHARGE: 09/23/2015  1:16 PM  PRIMARY CARE PHYSICIAN: THIES, DAVID, MD    ADMISSION DIAGNOSIS:   Bowel Obstruction SMALL BOWEL OBSTRUCTION  DISCHARGE DIAGNOSIS:  Active Problems:   Benign fibroma of prostate   Essential (primary) hypertension   HLD (hyperlipidemia)   SBO (small bowel obstruction) (HCC)   Protein-calorie malnutrition, severe   Small bowel tumor   SECONDARY DIAGNOSIS:   Past Medical History  Diagnosis Date  . GERD (gastroesophageal reflux disease)   . Nocturia   . Urgency-frequency syndrome   . Dribbling   . Hesitancy   . HTN (hypertension)   . BPH (benign prostatic hyperplasia)   . Obese   . ED (erectile dysfunction)   . Hypogonadism in male   . Essential (primary) hypertension 06/03/2015  . Acid reflux 06/03/2015  . Adenomatous colon polyp 08/12/2014    Overview:  On 05/25/2011 colonoscopy; repeat 05/2016 (Dr. Gustavo Lah)   . Glaucoma 08/07/2013    HOSPITAL COURSE:   1. Small bowel obstruction and mass in the abdomen. Patient was taken to the operating room by the surgical team. Please see operative report. Did well postoperatively and had bowel movements and tolerated diet. Follow-up with surgery as outpatient. Official pathology still pending at this time. Follow-up with oncology as outpatient. 2. Essential hypertension continue lisinopril HCT 3. BPH on finasteride 4. Epistaxis. I did give Afrin nasal spray while in the hospital. I stopped the Lovenox injections. If this recurs can follow up with ENT as outpatient. 5. Glaucoma unspecified on eyedrops   DISCHARGE CONDITIONS:   Satisfactory  CONSULTS OBTAINED:  Treatment Team:  Clayburn Pert, MD  DRUG ALLERGIES:  No Known Allergies  DISCHARGE MEDICATIONS:   Discharge Medication  List as of 09/23/2015 11:21 AM    START taking these medications   Details  oxyCODONE (OXY IR/ROXICODONE) 5 MG immediate release tablet Take 1 tablet (5 mg total) by mouth every 6 (six) hours as needed for moderate pain or breakthrough pain., Starting 09/23/2015, Until Discontinued, Print      CONTINUE these medications which have CHANGED   Details  lisinopril-hydrochlorothiazide (PRINZIDE,ZESTORETIC) 10-12.5 MG tablet Take 1 tablet by mouth daily., Starting 09/23/2015, Until Mon 10/26/16, Print      CONTINUE these medications which have NOT CHANGED   Details  finasteride (PROSCAR) 5 MG tablet Take 1 tablet (5 mg total) by mouth daily., Starting 06/03/2015, Until Discontinued, Normal    latanoprost (XALATAN) 0.005 % ophthalmic solution Place 1 drop into both eyes every morning. , Starting 04/24/2015, Until Discontinued, Historical Med    timolol (TIMOPTIC) 0.5 % ophthalmic solution Apply 1 drop to eye every morning. , Starting 05/12/2013, Until Discontinued, Historical Med    Omega-3 Fatty Acids (FISH OIL) 1200 MG CAPS Take 1,200-2,400 mg by mouth See admin instructions. Take 2 capsules (2400mg ) by mouth every morning and 1 capsule by mouth in the evening., Until Discontinued, Historical Med    shark liver oil-cocoa butter (PREPARATION H) 0.25-3-85.5 % suppository Place 1 suppository rectally daily as needed for hemorrhoids., Until Discontinued, Historical Med      STOP taking these medications     aspirin EC 81 MG tablet      ibuprofen (ADVIL,MOTRIN) 200 MG tablet          DISCHARGE INSTRUCTIONS:  Follow-up with your medical doctor as outpatient Follow-up with surgery as outpatient Oncology as outpatient  If you experience worsening of your admission symptoms, develop shortness of breath, life threatening emergency, suicidal or homicidal thoughts you must seek medical attention immediately by calling 911 or calling your MD immediately  if symptoms less severe.  You Must read complete  instructions/literature along with all the possible adverse reactions/side effects for all the Medicines you take and that have been prescribed to you. Take any new Medicines after you have completely understood and accept all the possible adverse reactions/side effects.   Please note  You were cared for by a hospitalist during your hospital stay. If you have any questions about your discharge medications or the care you received while you were in the hospital after you are discharged, you can call the unit and asked to speak with the hospitalist on call if the hospitalist that took care of you is not available. Once you are discharged, your primary care physician will handle any further medical issues. Please note that NO REFILLS for any discharge medications will be authorized once you are discharged, as it is imperative that you return to your primary care physician (or establish a relationship with a primary care physician if you do not have one) for your aftercare needs so that they can reassess your need for medications and monitor your lab values.    Today   CHIEF COMPLAINT:  Sent in as a direct admit to the medical service for bowel obstruction and mass in the abdomen  HISTORY OF PRESENT ILLNESS:  Zachary Robertson  is a 75 y.o. male sent in for direct admission for mass in the abdomen and bowel obstruction   VITAL SIGNS:  Blood pressure 152/68, pulse 79, temperature 98.1 F (36.7 C), temperature source Oral, resp. rate 18, height 5\' 10"  (1.778 m), weight 102.967 kg (227 lb), SpO2 95 %.    PHYSICAL EXAMINATION:  GENERAL:  75 y.o.-year-old patient lying in the bed with no acute distress.  EYES: Pupils equal, round, reactive to light and accommodation. No scleral icterus. Extraocular muscles intact.  HEENT: Head atraumatic, normocephalic. Oropharynx and nasopharynx clear.  NECK:  Supple, no jugular venous distention. No thyroid enlargement, no tenderness.  LUNGS: Normal breath sounds  bilaterally, no wheezing, rales,rhonchi or crepitation. No use of accessory muscles of respiration.  CARDIOVASCULAR: S1, S2 normal. No murmurs, rubs, or gallops.  ABDOMEN: Soft, Generalized tenderness, slight distended. Bowel sounds present. No organomegaly or mass.  EXTREMITIES: No pedal edema, cyanosis, or clubbing.  NEUROLOGIC: Cranial nerves II through XII are intact. Muscle strength 5/5 in all extremities. Sensation intact. Gait not checked.  PSYCHIATRIC: The patient is alert and oriented x 3.  SKIN: No obvious rash, lesion, or ulcer.   DATA REVIEW:   CBC  Recent Labs Lab 09/20/15 0417  WBC 9.5  HGB 11.4*  HCT 32.6*  PLT 176    Chemistries   Recent Labs Lab 09/16/15 1615  09/20/15 0417  NA 133*  < > 136  K 3.1*  < > 3.7  CL 96*  < > 99*  CO2 27  < > 28  GLUCOSE 111*  < > 90  BUN 25*  < > 22*  CREATININE 1.10  < > 0.67  CALCIUM 9.7  < > 8.5*  AST 24  --   --   ALT 27  --   --   ALKPHOS 88  --   --   BILITOT 0.6  --   --   < > =  values in this interval not displayed.   Microbiology Results  Results for orders placed or performed during the hospital encounter of 09/16/15  Surgical pcr screen     Status: None   Collection Time: 09/18/15  1:46 AM  Result Value Ref Range Status   MRSA, PCR NEGATIVE NEGATIVE Final   Staphylococcus aureus NEGATIVE NEGATIVE Final    Comment:        The Xpert SA Assay (FDA approved for NASAL specimens in patients over 30 years of age), is one component of a comprehensive surveillance program.  Test performance has been validated by [facility] for patients greater than or equal to 47 year old. It is not intended to diagnose infection nor to guide or monitor treatment.      Management plans discussed with the patient, family and they are in agreement.  CODE STATUS:     Code Status Orders        Start     Ordered   09/16/15 2101  Full code   Continuous     09/16/15 2100    Code Status History    Date Active Date  Inactive Code Status Order ID Comments User Context   06/16/2015  3:44 PM 06/20/2015  3:51 PM Full Code [id number]  Clayburn Pert, MD Inpatient    Advance Directive Documentation        Most Recent Value   Type of Advance Directive  Healthcare Power of Attorney   Pre-existing out of facility DNR order (yellow form or pink MOST form)     "MOST" Form in Place?        TOTAL TIME TAKING CARE OF THIS PATIENT: 35 minutes.    Loletha Grayer M.D on 09/23/2015 at 3:16 PM  Between 7am to 6pm - Pager - [phone]  After 6pm go to [email or web address] - password [email or web address] [facility]  [phone]  CC: Primary care physician; Ezequiel Kayser, MD

## 2015-09-23 NOTE — Progress Notes (Signed)
Patient ID: Zachary Robertson, male   DOB: [birth year], 75 y.o.   MRN: [id number]  Case discussed with general surgery. He would like me to do the discharge since he will be in the OR He is okay with discharge and follow up  Another 15 minutes spent on organizing discharge  Dr Leslye Peer

## 2015-09-23 NOTE — Progress Notes (Signed)
Patient ID: Zachary Robertson, male   DOB: 08-Jan-1941, 75 y.o.   MRN: [id number] [facility] PROGRESS NOTE  Zachary Robertson [id number] DOB: [birth year] DOA: 09/16/2015 PCP: Ezequiel Kayser, MD  HPI/Subjective: Patient had nose bleed yesterday, stopped after afrin nasal spray given. Some reoccurence this am.  Objective: Filed Vitals:   09/22/15 2151 09/23/15 0512  BP: 148/73 152/68  Pulse: 79 79  Temp: 98.1 F (36.7 C) 98.1 F (36.7 C)  Resp: 18 18    Filed Weights   09/16/15 2132 09/18/15 1253  Weight: 103.103 kg (227 lb 4.8 oz) 102.967 kg (227 lb)    ROS: Review of Systems  Constitutional: Negative for fever and chills.  HENT: Positive for nosebleeds.   Eyes: Negative for blurred vision.  Respiratory: Negative for cough and shortness of breath.   Cardiovascular: Negative for chest pain.  Gastrointestinal: Positive for abdominal pain. Negative for nausea, vomiting, diarrhea and constipation.  Genitourinary: Negative for dysuria.  Musculoskeletal: Negative for joint pain.  Neurological: Negative for dizziness and headaches.   Exam: Physical Exam  HENT:  Nose: No mucosal edema.  Mouth/Throat: No oropharyngeal exudate or posterior oropharyngeal edema.  Eyes: Conjunctivae, EOM and lids are normal. Pupils are equal, round, and reactive to light.  Neck: No JVD present. Carotid bruit is not present. No edema present. No thyroid mass and no thyromegaly present.  Cardiovascular: S1 normal and S2 normal.  Exam reveals no gallop.   No murmur heard. Pulses:      Dorsalis pedis pulses are 2+ on the right side, and 2+ on the left side.  Respiratory: No respiratory distress. He has no wheezes. He has no rhonchi. He has no rales.  GI: Soft. Bowel sounds are normal. He exhibits distension. There is generalized tenderness.  Musculoskeletal:       Right ankle: He exhibits no swelling.       Left ankle: He exhibits no swelling.  Lymphadenopathy:    He has no cervical adenopathy.   Neurological: He is alert. No cranial nerve deficit.  Skin: Skin is warm. No rash noted. Nails show no clubbing.  Psychiatric: He has a normal mood and affect.      Data Reviewed: Basic Metabolic Panel:  Recent Labs Lab 09/16/15 1615 09/17/15 0346 09/18/15 1742 09/19/15 0429 09/20/15 0417  NA 133* 136  --  136 136  K 3.1* 3.5  --  4.3 3.7  CL 96* 95*  --  99* 99*  CO2 27 31  --  27 28  GLUCOSE 111* 112*  --  114* 90  BUN 25* 23*  --  21* 22*  CREATININE 1.10 1.06 1.01 0.96 0.67  CALCIUM 9.7 9.3  --  8.7* 8.5*   Liver Function Tests:  Recent Labs Lab 09/16/15 1615  AST 24  ALT 27  ALKPHOS 88  BILITOT 0.6  PROT 7.8  ALBUMIN 4.1    Recent Labs Lab 09/16/15 1615  LIPASE 42   CBC:  Recent Labs Lab 09/16/15 1615 09/17/15 0346 09/18/15 1742 09/19/15 0429 09/20/15 0417  WBC 8.8 9.4 12.8* 11.4* 9.5  HGB 13.1 12.8* 13.0 12.1* 11.4*  HCT 36.6* 35.9* 38.7* 34.9* 32.6*  MCV 87.2 87.3 89.3 88.2 87.9  PLT 223 213 192 189 176     Recent Results (from the past 240 hour(s))  Surgical pcr screen     Status: None   Collection Time: 09/18/15  1:46 AM  Result Value Ref Range Status   MRSA, PCR NEGATIVE NEGATIVE Final  Staphylococcus aureus NEGATIVE NEGATIVE Final    Comment:        The Xpert SA Assay (FDA approved for NASAL specimens in patients over 54 years of age), is one component of a comprehensive surveillance program.  Test performance has been validated by [facility] for patients greater than or equal to 63 year old. It is not intended to diagnose infection nor to guide or monitor treatment.      Scheduled Meds: . finasteride  5 mg Oral Daily  . hydrochlorothiazide  12.5 mg Oral Daily  . latanoprost  1 drop Both Eyes BH-q7a  . lisinopril  10 mg Oral Daily  . oxymetazoline  2 spray Each Nare Once  . timolol  1 drop Both Eyes BH-q7a    Assessment/Plan:  1. Essential hypertension. On hydrochlorothiazide and lisinopril. 2. Mass in the  bowel and bowel obstruction. Status post surgery. As per surgery may go home today. Will likely need oncology as outpatient. Still not seeing final pathology results in computer yet. 3. Glaucoma unspecified continue with Catapres and timolol 4. 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 On finasteride. 5. Slight epistaxsis. Give another dose of afrin nasal spray. Likely from NGT trauma. Try not to blow nose or snort through nose. If needed they can follow up with ENT as outpatient  Code Status:     Code Status Orders        Start     Ordered   09/16/15 2101  Full code   Continuous     09/16/15 2100    Code Status History    Date Active Date Inactive Code Status Order ID Comments User Context   06/16/2015  3:44 PM 06/20/2015  3:51 PM Full Code [id number]  Clayburn Pert, MD Inpatient    Advance Directive Documentation        Most Recent Value   Type of Advance Directive  Healthcare Power of Attorney   Pre-existing out of facility DNR order (yellow form or pink MOST form)     "MOST" Form in Place?       Disposition Plan: Potentially home as per surgery  Time spent: 20 minutes  [place], [place]

## 2015-09-23 NOTE — Progress Notes (Signed)
Pt stable. IV removed. D/c instructions given and education provided. Signed prescriptions verified and given. Pt states he understands instructions. Pt dressed and escorted out by staff. Driven home by family.  

## 2015-09-23 NOTE — Care Management Important Message (Signed)
Important Message  Patient Details  Name: Zachary Robertson MRN: [id number] Date of Birth: 1940/11/30   Medicare Important Message Given:  Yes    Katrina Stack, RN 09/23/2015, 11:54 AM

## 2015-09-23 NOTE — Discharge Instructions (Signed)
Open Small Bowel Resection Small bowel resection is surgery to remove part of the small bowel. The small bowel, also called the small intestine, is the top part of your intestines. It is part of the digestive system. When food leaves the stomach, it goes into the small bowel. Most food is then absorbed into the body. A small bowel resection may be needed if the small bowel becomes blocked or harmed by disease. One type of procedure is called an open resection. This means the surgeon will make a long cut (incision) to open your abdomen for the procedure.  LET [facility] PROVIDER KNOW ABOUT:  Any allergies you have.  All medicines you are taking, including vitamins, herbs, eye drops, creams, and over-the-counter medicines.  Previous problems you or members of your family have had with the use of anesthetics.  Any blood disorders you have.  Previous surgeries you have had.  Medical conditions you have. RISKS AND COMPLICATIONS Generally, this is a safe procedure. However, as with any procedure, complications can occur. Possible complications include:  Bleeding.  Infection.  A blood clot that forms somewhere in your veins and travels to the lung.  Leaking of intestinal fluids into the abdomen.  A hernia. This occurs when the abdomen bulges out. It may require surgery in the future.  Damage to other organs in the abdomen.  Scarring where the incision is made or inside your body, around the intestines. If this occurs, surgery may be required in the future.  Not being able to absorb enough vitamins and nutrition through the small bowel. BEFORE THE PROCEDURE  You may need to have various tests done before the procedure. These may include blood tests, X-rays, and imaging scans that take pictures of the small bowel.  Do not eat or drink anything for at least 8 hours before the procedure.  Make plans to have someone drive you home after your hospital stay. Also arrange for someone to  help you with activities during recovery. PROCEDURE An open small bowel resection can take 2-4 hours.  Your heart rate, blood pressure, and oxygen level will be monitored.  An IV access tube will be inserted into one of your veins. Medicine will flow directly into your body through the IV tube.  You might be given a medicine to help you relax (sedative).  You will be given a medicine to make you sleep through the procedure (general anesthetic).  Several tubes may be put in your body.  A tube in your throat will help you breathe during the procedure. It also may be used to give you anesthetic gas during the procedure.  A nasogastric tube will go through your nose and into your stomach.Fluids from your stomach will drain through this tube during and after the procedure.  A thin, flexible tube (catheter) in your bladder will drain urine during and after the procedure.  Once you are asleep, the surgeon will make an incision in the middle of your abdomen.  The surgeon will remove the affected piece of small bowel and then join the bowel together again with staples or stitches. This allows digested food to pass through again.  The surgeon will close the incision with staples or stitches. AFTER THE PROCEDURE  You will stay in a recovery area until the anesthetic has worn off. Your blood pressure and pulse will be checked often. Then you will be taken to a hospital room.  You will likely have some pain. This is normal. You will be  given pain medicine.  You will continue to get fluids through the IV tube for a while. Also, the nasogastric tube usually stays in for a few days until your bowels are working again.  After the nasogastric tube is out, you can start eating food again. You will start with liquids. If all is okay, the IV tube can also come out, and you can start to eat more solid foods.  You will be asked to get up and start walking within a day.This helps keep blood clots from  forming in your legs.  You may be told to breathe deeply. You may also be told to cough now and then.This keeps your airways open.  You will likely need to stay in the hospital for 3-7 days.   This information is not intended to replace advice given to you by your health care provider. Make sure you discuss any questions you have with your health care provider.   Document Released: 08/11/2010 Document Revised: 12/28/2012 Document Reviewed: 10/19/2012 Elsevier Interactive Patient Education [employer].

## 2015-09-23 NOTE — Progress Notes (Signed)
Patient is discussed with prime doc. He is feeling well tolerating a diet and passing gas with no nausea or vomiting.  Vital signs are stable Abdomen is soft slightly distended slightly tympanitic nontender wound is clean without erythema or drainage calves are nontender patient is awake alert and oriented  Patient doing very well he can be discharged today and this was discussed with prime doc. We'll follow up with [place] in 10 days for staple removal.

## 2015-09-26 ENCOUNTER — Other Ambulatory Visit: Payer: Self-pay

## 2015-09-26 ENCOUNTER — Ambulatory Visit: Payer: PPO

## 2015-09-30 ENCOUNTER — Ambulatory Visit (INDEPENDENT_AMBULATORY_CARE_PROVIDER_SITE_OTHER): Payer: PPO | Admitting: Surgery

## 2015-09-30 ENCOUNTER — Encounter: Payer: Self-pay | Admitting: Surgery

## 2015-09-30 VITALS — BP 154/74 | HR 65 | Temp 98.2°F | Ht 70.0 in | Wt 227.0 lb

## 2015-09-30 DIAGNOSIS — K5669 Other intestinal obstruction: Secondary | ICD-10-CM

## 2015-09-30 DIAGNOSIS — K56609 Unspecified intestinal obstruction, unspecified as to partial versus complete obstruction: Secondary | ICD-10-CM

## 2015-09-30 DIAGNOSIS — D379 Neoplasm of uncertain behavior of digestive organ, unspecified: Secondary | ICD-10-CM

## 2015-09-30 DIAGNOSIS — D490 Neoplasm of unspecified behavior of digestive system: Secondary | ICD-10-CM

## 2015-09-30 LAB — SURGICAL PATHOLOGY

## 2015-09-30 NOTE — Patient Instructions (Signed)
Please call our office if you have questions or concerns.   

## 2015-09-30 NOTE — Progress Notes (Signed)
75 yr old male who had an exploratory laparotomy for small bowel obstruction with discovery of a small bowel tumor that was resected in my partner Dr. Adonis Huguenin on June 28. Patient is doing well after surgery. Patient states that his energy and his appetite are slowly returning. Patient states that he will have a normal stool in the morning and after he eats he'll have a diarrhea loose stool within the hour. Patient does not complain of any acid reflux, bad taste in his mouth or abdominal pain with the diarrhea but states that it always happens within about an hour of him having breakfast. Patient states that it does not happen at any other time a day. Was a patient states his pain is well under control and is anxious about the pathology report.  Filed Vitals:   09/30/15 1212  BP: 154/74  Pulse: 65  Temp: 98.2 F (36.8 C)   PE:  Gen: NAD [id number], appropraitely tender, incision c/d/i, staples removed and steristrips applied   A/P:  Patient doing well after exploratory laparotomy and small bowel tumor removal.  Unfortunately the pathology report is not here at this time. 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 We'll have the patient return in one week for a wound check with Dr. Adonis Huguenin and hopefully pathology will be back at that time we'll be able to determine if a port placement is needed as well.

## 2015-10-04 ENCOUNTER — Encounter: Payer: Self-pay | Admitting: Oncology

## 2015-10-04 ENCOUNTER — Inpatient Hospital Stay: Payer: PPO | Attending: Oncology | Admitting: Oncology

## 2015-10-04 VITALS — BP 138/82 | HR 60 | Temp 97.5°F | Wt 226.2 lb

## 2015-10-04 DIAGNOSIS — C8330 Diffuse large B-cell lymphoma, unspecified site: Secondary | ICD-10-CM | POA: Insufficient documentation

## 2015-10-04 DIAGNOSIS — I10 Essential (primary) hypertension: Secondary | ICD-10-CM | POA: Insufficient documentation

## 2015-10-04 DIAGNOSIS — C83398 Diffuse large b-cell lymphoma of other extranodal and solid organ sites: Secondary | ICD-10-CM

## 2015-10-04 DIAGNOSIS — K219 Gastro-esophageal reflux disease without esophagitis: Secondary | ICD-10-CM | POA: Insufficient documentation

## 2015-10-04 DIAGNOSIS — H409 Unspecified glaucoma: Secondary | ICD-10-CM | POA: Insufficient documentation

## 2015-10-04 DIAGNOSIS — Z79899 Other long term (current) drug therapy: Secondary | ICD-10-CM

## 2015-10-04 DIAGNOSIS — N400 Enlarged prostate without lower urinary tract symptoms: Secondary | ICD-10-CM | POA: Insufficient documentation

## 2015-10-04 DIAGNOSIS — D649 Anemia, unspecified: Secondary | ICD-10-CM | POA: Diagnosis not present

## 2015-10-04 DIAGNOSIS — C8510 Unspecified B-cell lymphoma, unspecified site: Secondary | ICD-10-CM | POA: Insufficient documentation

## 2015-10-04 DIAGNOSIS — E669 Obesity, unspecified: Secondary | ICD-10-CM | POA: Diagnosis not present

## 2015-10-04 DIAGNOSIS — Z87891 Personal history of nicotine dependence: Secondary | ICD-10-CM | POA: Insufficient documentation

## 2015-10-04 DIAGNOSIS — N281 Cyst of kidney, acquired: Secondary | ICD-10-CM | POA: Diagnosis not present

## 2015-10-04 DIAGNOSIS — N401 Enlarged prostate with lower urinary tract symptoms: Secondary | ICD-10-CM | POA: Diagnosis not present

## 2015-10-04 DIAGNOSIS — R351 Nocturia: Secondary | ICD-10-CM | POA: Diagnosis not present

## 2015-10-04 DIAGNOSIS — C8339 Diffuse large B-cell lymphoma, extranodal and solid organ sites: Secondary | ICD-10-CM | POA: Insufficient documentation

## 2015-10-04 DIAGNOSIS — C8513 Unspecified B-cell lymphoma, intra-abdominal lymph nodes: Secondary | ICD-10-CM | POA: Diagnosis not present

## 2015-10-04 NOTE — Progress Notes (Signed)
Patient referred after hospital surgery with Dr. Adonis Huguenin.

## 2015-10-07 ENCOUNTER — Encounter: Payer: Self-pay | Admitting: General Surgery

## 2015-10-08 ENCOUNTER — Encounter: Payer: Self-pay | Admitting: General Surgery

## 2015-10-08 ENCOUNTER — Ambulatory Visit (INDEPENDENT_AMBULATORY_CARE_PROVIDER_SITE_OTHER): Payer: PPO | Admitting: General Surgery

## 2015-10-08 VITALS — BP 133/71 | HR 73 | Temp 97.1°F | Ht 70.0 in | Wt 226.0 lb

## 2015-10-08 DIAGNOSIS — Z4889 Encounter for other specified surgical aftercare: Secondary | ICD-10-CM

## 2015-10-08 NOTE — Patient Instructions (Signed)
Please call our office with any questions or concerns. We will see you back in 2 weeks as scheduled below.   Please do not submerge in a tub, hot tub, or pool until incisions are completely sealed.  Use sun block to incision area over the next year if this area will be exposed to sun. This helps decrease scarring.  You may now resume your normal activities. Listen to your body when lifting, if you have pain when lifting, stop and then try again in a few days.  If you develop redness, drainage, or pain at incision sites- call our office immediately and speak with a nurse.

## 2015-10-08 NOTE — Progress Notes (Signed)
Outpatient Surgical Follow Up  10/08/2015  Zachary Robertson is an 75 y.o. male.   Chief Complaint  Patient presents with  . Routine Post Op    Exploratory Laparotomy with Small Bowel Tumor Removal (6/28)- Dr. Adonis Huguenin    HPI: 75 year old male returns to clinic 3 weeks status post exploratory laparotomy for a small bowel tumor. Patient reports continuing to improve. Tolerating regular diet and having bowel function. He states he is having 3 bowel movements a day but denies diarrhea. He denies any fevers, chills, chest pain, shortness of breath, nausea, vomiting, diarrhea, cons patient. He's been very happy with his surgical expresses thus far. He was seen by oncology last week and has a bone marrow biopsy and PET scan scheduled. Patient does state he has been having some intermittent drainage from a single spot in the middle of his incision.  Past Medical History  Diagnosis Date  . GERD (gastroesophageal reflux disease)   . Nocturia   . Urgency-frequency syndrome   . Dribbling   . Hesitancy   . HTN (hypertension)   . BPH (benign prostatic hyperplasia)   . Obese   . ED (erectile dysfunction)   . Hypogonadism in male   . Essential (primary) hypertension 06/03/2015  . Acid reflux 06/03/2015  . Adenomatous colon polyp 08/12/2014    Overview:  On 05/25/2011 colonoscopy; repeat 05/2016 (Dr. Gustavo Lah)   . Glaucoma 08/07/2013    Past Surgical History  Procedure Laterality Date  . Hernia repair    . Laparotomy N/A 09/18/2015    Procedure: EXPLORATORY LAPAROTOMY;  Surgeon: Clayburn Pert, MD;  Location: [facility] OR[facility];  Service: General;  Laterality: N/A;  . Laparoscopy  09/18/2015    Procedure: LAPAROSCOPY DIAGNOSTIC;  Surgeon: Clayburn Pert, MD;  Location: [facility] OR[facility];  Service: General;;  . Bowel resection  09/18/2015    Procedure: SMALL BOWEL RESECTION;  Surgeon: Clayburn Pert, MD;  Location: [facility] OR[facility];  Service: General;;  with anastomosis    Family History  Problem Relation Age of Onset  .  Prostate cancer Father   . Cancer Father   . Cancer      family members in general  . Bladder Cancer Neg Hx   . Kidney cancer Neg Hx   . Cancer Sister   . Clotting disorder Sister   . Cancer Brother     Social History:  reports that he quit smoking about 5 months ago. His smoking use included Cigars. He has never used smokeless tobacco. He reports that he drinks alcohol. He reports that he does not use illicit drugs.  Allergies: No Known Allergies  Medications reviewed.    ROS  A multipoint review of systems was completed, all pertinent positives and negatives are documented within the history of present illness and remainder are negative.  BP 133/71 mmHg  Pulse 73  Temp(Src) 97.1 F (36.2 C) (Oral)  Ht 5' 10"  (1.778 m)  Wt 102.513 kg (226 lb)  BMI 32.43 kg/m2  Physical Exam Gen.: No acute distress Chest: Clear to auscultation Heart: Regular rate and rhythm Abdomen: Large, soft, nontender, nondistended. Midline incision well approximated without evidence of spreading erythema. There is a single pinpoint area of drainage below the level of the umbilicus but does not extend to the fascia on probing with a cotton tip applicator.    No results found for this or any previous visit (from the past 48 hour(s)). No results found.  Assessment/Plan:  1. Aftercare following surgery 75 year old male status post export her laparotomy for  a near obstructing B-cell lymphoma. He has been seen by oncology who will complete the workup with a PET scan and a bone marrow biopsy. Treated single area of drainage with silver nitrate in clinic today and dressed with a dressing. Counseled that he will have dark drainage for the next couple of days secondary to the silver nitrate. Again discussed the signs and symptoms of infection and return to clinic immediately should they occur. Otherwise he'll follow-up with me in clinic in 2-3 weeks for an additional wound check.     Clayburn Pert, MD  FACS General Surgeon  10/08/2015,11:08 AM

## 2015-10-09 NOTE — Progress Notes (Signed)
.  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  Final pathology was consistent with a diffuse large B-cell lymphoma.  Currently patient feels well and is nearly back to his baseline. Patient has had weight loss, but denies and fevers, night sweats, or other B symptoms.  His abdominal pain, nausea and vomiting have resolved.  He denies and chest pain or shortness of breath.  He has no neurological complaints. He continues to have weakness and fatigue, but this is improving.  He has no urinary complaints. Patient offers no futher specific complaints today.  REVIEW OF SYSTEMS:   Review of Systems  Constitutional: Positive for weight loss and malaise/fatigue. Negative for fever.  Respiratory: Negative.  Negative for cough and hemoptysis.   Cardiovascular: Negative.  Negative for chest pain.  Gastrointestinal: Positive for nausea, abdominal pain and melena. Negative for vomiting and blood in stool.  Musculoskeletal: Negative.   Neurological: Positive for weakness.  Endo/Heme/Allergies: Does not bruise/bleed easily.  Psychiatric/Behavioral: The patient is nervous/anxious.     As per HPI. Otherwise, a complete review of systems is negatve.  PAST MEDICAL HISTORY: Past Medical History  Diagnosis Date  . GERD (gastroesophageal reflux disease)   . Nocturia   . Urgency-frequency syndrome   . Dribbling   . Hesitancy   . HTN (hypertension)   . BPH (benign prostatic hyperplasia)   . Obese   . ED (erectile dysfunction)   . Hypogonadism in male   . Essential (primary) hypertension 06/03/2015  . Acid reflux  06/03/2015  . Adenomatous colon polyp 08/12/2014    Overview:  On 05/25/2011 colonoscopy; repeat 05/2016 (Dr. Gustavo Lah)   . Glaucoma 08/07/2013    PAST SURGICAL HISTORY: Past Surgical History  Procedure Laterality Date  . Hernia repair    . Laparotomy N/A 09/18/2015    Procedure: EXPLORATORY LAPAROTOMY;  Surgeon: Clayburn Pert, MD;  Location: [facility];  Service: General;  Laterality: N/A;  . Laparoscopy  09/18/2015    Procedure: LAPAROSCOPY DIAGNOSTIC;  Surgeon: Clayburn Pert, MD;  Location: [facility];  Service: General;;  . Bowel resection  09/18/2015    Procedure: SMALL BOWEL RESECTION;  Surgeon: Clayburn Pert, MD;  Location: [facility];  Service: General;;  with anastomosis    FAMILY HISTORY: Family History  Problem Relation Age of Onset  . Prostate cancer Father   . Cancer Father   . Cancer      family members in general  . Bladder Cancer Neg Hx   . Kidney cancer Neg Hx   . Cancer Sister   . Clotting disorder Sister   . Cancer Brother        ADVANCED DIRECTIVES:    HEALTH MAINTENANCE: Social History  Substance Use Topics  . Smoking status: Former Smoker    Types: Cigars    Quit date: 04/10/2015  . Smokeless tobacco: Never Used     Comment: occasional cigar  . Alcohol Use: 0.0 oz/week    0 Standard drinks or equivalent per week     Comment: 1-2 drinks of Liquor Daily.     Colonoscopy:  PAP:  Bone density:  Lipid panel:  No Known Allergies  Current  Outpatient Prescriptions  Medication Sig Dispense Refill  . finasteride (PROSCAR) 5 MG tablet Take 1 tablet (5 mg total) by mouth daily. 90 tablet 3  . latanoprost (XALATAN) 0.005 % ophthalmic solution Place 1 drop into both eyes every morning.     Marland Kitchen lisinopril-hydrochlorothiazide (PRINZIDE,ZESTORETIC) 10-12.5 MG tablet Take 1 tablet by mouth daily. 30 tablet 0  . Omega-3 Fatty Acids (FISH OIL) 1200 MG CAPS Take 1,200-2,400 mg by mouth See admin instructions. Take 2 capsules (2424m) by mouth every morning and 1  capsule by mouth in the evening.    . shark liver oil-cocoa butter (PREPARATION H) 0.25-3-85.5 % suppository Place 1 suppository rectally daily as needed for hemorrhoids.    . timolol (TIMOPTIC) 0.5 % ophthalmic solution Apply 1 drop to eye every morning.      No current facility-administered medications for this visit.    OBJECTIVE: Filed Vitals:   10/04/15 1110  BP: 138/82  Pulse: 60  Temp: 97.5 F (36.4 C)     Body mass index is 32.46 kg/(m^2).    ECOG FS:1 - Symptomatic but completely ambulatory  General: Well-developed, well-nourished, no acute distress. Eyes: Pink conjunctiva, anicteric sclera. HEENT: Normocephalic, moist mucous membranes, clear oropharnyx. Lungs: Clear to auscultation bilaterally. Heart: Regular rate and rhythm. No rubs, murmurs, or gallops. Abdomen: Soft, nontender, nondistended. No organomegaly noted, normoactive bowel sounds. Musculoskeletal: No edema, cyanosis, or clubbing. Neuro: Alert, answering all questions appropriately. Cranial nerves grossly intact. Skin: No rashes or petechiae noted. Psych: Normal affect. Lymphatics: No cervical, calvicular, axillary or inguinal LAD.   LAB RESULTS:  Lab Results  Component Value Date   NA 136 09/20/2015   K 3.7 09/20/2015   CL 99* 09/20/2015   CO2 28 09/20/2015   GLUCOSE 90 09/20/2015   BUN 22* 09/20/2015   CREATININE 0.67 09/20/2015   CALCIUM 8.5* 09/20/2015   PROT 7.8 09/16/2015   ALBUMIN 4.1 09/16/2015   AST 24 09/16/2015   ALT 27 09/16/2015   ALKPHOS 88 09/16/2015   BILITOT 0.6 09/16/2015   GFRNONAA >60 09/20/2015   GFRAA >60 09/20/2015    Lab Results  Component Value Date   WBC 9.5 09/20/2015   HGB 11.4* 09/20/2015   HCT 32.6* 09/20/2015   MCV 87.9 09/20/2015   PLT 176 09/20/2015     STUDIES: Dg Abd 1 View  09/17/2015  CLINICAL DATA:  Nasogastric tube placed. EXAM: ABDOMEN - 1 VIEW COMPARISON:  CT and radiographs, 09/16/2015 FINDINGS: Nasogastric tube extends into the stomach.  There is persistent small bowel dilatation consistent with obstruction. No extraluminal air is evident. IMPRESSION: Nasogastric tube reaches the stomach. Persistent small bowel obstruction. Electronically Signed   By: DAndreas NewportM.D.   On: 09/17/2015 01:29   Dg Abd 1 View  09/16/2015  CLINICAL DATA:  Small-bowel obstruction. EXAM: ABDOMEN - 1 VIEW COMPARISON:  CT abdomen from earlier same day. FINDINGS: Prominently distended gas-filled loops of small bowel within the central abdomen and left lower abdomen, compatible with the small bowel obstruction seen on earlier CT. Paucity of colonic bowel gas. No evidence of free intraperitoneal air seen. IMPRESSION: Small-bowel obstruction, compatible with the obstruction seen on earlier CT. Electronically Signed   By: SFranki CabotM.D.   On: 09/16/2015 21:23   Ct Abdomen Pelvis W Contrast  09/16/2015  CLINICAL DATA:  Generalized abdominal pain, vomiting and diarrhea for 1 month. History of hernia repair. EXAM: CT ABDOMEN AND PELVIS WITH CONTRAST TECHNIQUE: Multidetector CT imaging of the abdomen and pelvis was performed  using the standard protocol following bolus administration of intravenous contrast. CONTRAST:  118m ISOVUE-300 IOPAMIDOL (ISOVUE-300) INJECTION 61% COMPARISON:  06/19/2015 abdominal radiographs and 06/16/2015 CT abdomen/pelvis. FINDINGS: Motion degraded scan. Lower chest: No significant pulmonary nodules or acute consolidative airspace disease. Hepatobiliary: Normal liver with no liver mass. Normal gallbladder with no radiopaque cholelithiasis. No biliary ductal dilatation. Pancreas: Normal, with no mass or duct dilation. Spleen: Normal size. No mass. Adrenals/Urinary Tract: Normal adrenals. Simple 1.2 cm medial upper right renal cyst. Simple 3.0 cm upper left renal cyst. No hydronephrosis. Collapsed and grossly normal bladder. Stomach/Bowel: Grossly normal stomach. There are prominently dilated (up to 5.0 cm diameter) fluid-filled proximal to  mid small bowel loops throughout the abdomen. The distal and terminal ileum is collapsed. There is a focal caliber transition in the distal small bowel in the right abdomen at the site of a 4.9 x 4.3 cm solid small bowel mass (series 2/ image 53). There is stool sign within the small bowel proximal to the small bowel mass. Collapsed large bowel. Mild sigmoid diverticulosis. No large bowel wall thickening or pericolonic fat stranding. Vascular/Lymphatic: Atherosclerotic nonaneurysmal abdominal aorta. Patent portal, splenic, hepatic and renal veins. There are several mildly enlarged lymph nodes in the right mesentery adjacent to the small bowel mass, largest 1.4 cm (series 2/ image 58). No additional pathologically enlarged abdominopelvic nodes. Reproductive: Top-normal size prostate. Other: No pneumoperitoneum, ascites or focal fluid collection. Musculoskeletal: No aggressive appearing focal osseous lesions. Marked thoracolumbar spondylosis. IMPRESSION: 1. Distal small bowel obstruction due to a distal small bowel 4.9 x 4.3 cm mass in the right abdomen with adjacent mesenteric adenopathy. Considerations include lymphoma, carcinoid, metastasis or adenocarcinoma. Surgical consultation advised. 2. Aortic atherosclerosis. These results were called by telephone at the time of interpretation on 09/16/2015 at 5:55 pm to NP [facility], who verbally acknowledged these results. Electronically Signed   By: JIlona SorrelM.D.   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 Because of the extensive extranodal involvement of the terminal illium, this is considered stage IV disease. Patient will require a PET scan as well as a bone marrow biopsy in the next 1-2 weeks to determine the extent of his  disease. Patient will return to clinic in approximately 2 weeks to discuss the results and treatment planning.  Approximately 45 minutes was spent in discussion of which greater than 50% was consultation.  Patient expressed understanding and was in agreement with this plan. He also understands that He can call clinic at any time with any questions, concerns, or complaints.   Diff[place] cell [place]   Staging form: Lymphoid Neoplasms, AJCC 6th Edition     Clinical stage from 10/09/2015: Stage IV - Signed by TLloyd Huger MD on 10/09/2015   TLloyd Huger MD   10/09/2015 7:49 AM

## 2015-10-10 ENCOUNTER — Ambulatory Visit
Admission: RE | Admit: 2015-10-10 | Discharge: 2015-10-10 | Disposition: A | Discharge status: Home / Self Care | Payer: PPO | Source: Ambulatory Visit | Attending: Oncology | Admitting: Oncology

## 2015-10-10 DIAGNOSIS — R933 Abnormal findings on diagnostic imaging of other parts of digestive tract: Secondary | ICD-10-CM | POA: Diagnosis not present

## 2015-10-10 DIAGNOSIS — C8330 Diffuse large B-cell lymphoma, unspecified site: Secondary | ICD-10-CM | POA: Insufficient documentation

## 2015-10-10 LAB — GLUCOSE, CAPILLARY: Glucose-Capillary: 106 mg/dL — ABNORMAL HIGH (ref 65–99)

## 2015-10-10 MED ORDER — FLUDEOXYGLUCOSE F - 18 (FDG) INJECTION
12.6500 | Freq: Once | INTRAVENOUS | Status: AC | PRN
  Administered 2015-10-10: 12.65 via INTRAVENOUS

## 2015-10-15 ENCOUNTER — Encounter: Payer: Self-pay | Admitting: Oncology

## 2015-10-15 ENCOUNTER — Ambulatory Visit: Admission: RE | Admit: 2015-10-15 | Payer: PPO | Source: Ambulatory Visit

## 2015-10-16 ENCOUNTER — Other Ambulatory Visit: Payer: Self-pay | Admitting: General Surgery

## 2015-10-17 ENCOUNTER — Ambulatory Visit
Admission: RE | Admit: 2015-10-17 | Discharge: 2015-10-17 | Disposition: A | Discharge status: Home / Self Care | Payer: PPO | Source: Ambulatory Visit | Attending: Oncology | Admitting: Oncology

## 2015-10-17 DIAGNOSIS — D649 Anemia, unspecified: Secondary | ICD-10-CM | POA: Diagnosis not present

## 2015-10-17 DIAGNOSIS — I10 Essential (primary) hypertension: Secondary | ICD-10-CM | POA: Diagnosis not present

## 2015-10-17 DIAGNOSIS — Z8042 Family history of malignant neoplasm of prostate: Secondary | ICD-10-CM | POA: Insufficient documentation

## 2015-10-17 DIAGNOSIS — Z8051 Family history of malignant neoplasm of kidney: Secondary | ICD-10-CM | POA: Diagnosis not present

## 2015-10-17 DIAGNOSIS — C8330 Diffuse large B-cell lymphoma, unspecified site: Secondary | ICD-10-CM | POA: Diagnosis not present

## 2015-10-17 DIAGNOSIS — E669 Obesity, unspecified: Secondary | ICD-10-CM | POA: Insufficient documentation

## 2015-10-17 DIAGNOSIS — C8338 Diffuse large B-cell lymphoma, lymph nodes of multiple sites: Secondary | ICD-10-CM | POA: Diagnosis not present

## 2015-10-17 DIAGNOSIS — Z87891 Personal history of nicotine dependence: Secondary | ICD-10-CM | POA: Insufficient documentation

## 2015-10-17 DIAGNOSIS — R351 Nocturia: Secondary | ICD-10-CM | POA: Insufficient documentation

## 2015-10-17 DIAGNOSIS — N529 Male erectile dysfunction, unspecified: Secondary | ICD-10-CM | POA: Diagnosis not present

## 2015-10-17 DIAGNOSIS — D126 Benign neoplasm of colon, unspecified: Secondary | ICD-10-CM | POA: Insufficient documentation

## 2015-10-17 DIAGNOSIS — Z800 Family history of malignant neoplasm of digestive organs: Secondary | ICD-10-CM | POA: Insufficient documentation

## 2015-10-17 DIAGNOSIS — H409 Unspecified glaucoma: Secondary | ICD-10-CM | POA: Insufficient documentation

## 2015-10-17 DIAGNOSIS — K219 Gastro-esophageal reflux disease without esophagitis: Secondary | ICD-10-CM | POA: Diagnosis not present

## 2015-10-17 DIAGNOSIS — N400 Enlarged prostate without lower urinary tract symptoms: Secondary | ICD-10-CM | POA: Diagnosis not present

## 2015-10-17 HISTORY — DX: Essential (primary) hypertension: I10

## 2015-10-17 HISTORY — DX: Malignant (primary) neoplasm, unspecified: C80.1

## 2015-10-17 LAB — CBC
HEMATOCRIT: 35.8 % — AB (ref 40.0–52.0)
HEMOGLOBIN: 12.3 g/dL — AB (ref 13.0–18.0)
MCH: 30.3 pg (ref 26.0–34.0)
MCHC: 34.4 g/dL (ref 32.0–36.0)
MCV: 88.1 fL (ref 80.0–100.0)
Platelets: 202 10*3/uL (ref 150–440)
RBC: 4.06 MIL/uL — AB (ref 4.40–5.90)
RDW: 14.7 % — ABNORMAL HIGH (ref 11.5–14.5)
WBC: 5.9 10*3/uL (ref 3.8–10.6)

## 2015-10-17 LAB — DIFFERENTIAL
Basophils Absolute: 0.2 10*3/uL — ABNORMAL HIGH (ref 0–0.1)
Basophils Relative: 3 %
EOS PCT: 5 %
Eosinophils Absolute: 0.3 10*3/uL (ref 0–0.7)
LYMPHS ABS: 1.6 10*3/uL (ref 1.0–3.6)
LYMPHS PCT: 27 %
MONO ABS: 0.9 10*3/uL (ref 0.2–1.0)
MONOS PCT: 16 %
NEUTROS ABS: 2.9 10*3/uL (ref 1.4–6.5)
Neutrophils Relative %: 49 %

## 2015-10-17 LAB — APTT: aPTT: 29 seconds (ref 24–36)

## 2015-10-17 LAB — PROTIME-INR
INR: 1.01
Prothrombin Time: 13.3 seconds (ref 11.4–15.2)

## 2015-10-17 MED ORDER — FENTANYL CITRATE (PF) 100 MCG/2ML IJ SOLN
INTRAMUSCULAR | Status: AC | PRN
  Administered 2015-10-17: 50 ug via INTRAVENOUS
  Administered 2015-10-17: 25 ug via INTRAVENOUS

## 2015-10-17 MED ORDER — SODIUM CHLORIDE 0.9 % IV SOLN: INTRAVENOUS | Status: DC

## 2015-10-17 MED ORDER — HYDROCODONE-ACETAMINOPHEN 5-325 MG PO TABS
1.0000 | ORAL_TABLET | ORAL | Status: DC | PRN
  Filled 2015-10-17: qty 2

## 2015-10-17 MED ORDER — MIDAZOLAM HCL 5 MG/5ML IJ SOLN
INTRAMUSCULAR | Status: AC | PRN
Start: 2015-10-17 — End: 2015-10-17
  Administered 2015-10-17 (×2): 1 mg via INTRAVENOUS

## 2015-10-17 NOTE — Consult Note (Signed)
Chief Complaint: Patient was seen in consultation today for bone marrow biopsy at the request of Finnegan,Timothy J  Referring Physician(s): Finnegan,Timothy J  Patient Status: Outpatient  History of Present Illness: Zachary Robertson is a 75 y.o. male with diffuse large B cell lymphoma and extranodal involvement of the terminal ileum.  Recent abdominal surgery for bowel obstruction. Patient has recovered well from surgery.  He has no complaints today.  Denies fevers, chills, shortness of breath, chest pain, bowel issues or abdominal pain.     Past Medical History:  Diagnosis Date  . Acid reflux 06/03/2015  . Adenomatous colon polyp 08/12/2014   Overview:  On 05/25/2011 colonoscopy; repeat 05/2016 (Dr. Gustavo Lah)   . BPH (benign prostatic hyperplasia)   . Cancer ([place])    LYMPHOMA  . Dribbling   . ED (erectile dysfunction)   . Essential (primary) hypertension 06/03/2015  . GERD (gastroesophageal reflux disease)   . Glaucoma 08/07/2013  . Hesitancy   . HTN (hypertension)   . Hypertension   . Hypogonadism in male   . Nocturia   . Obese   . Urgency-frequency syndrome     Past Surgical History:  Procedure Laterality Date  . BOWEL RESECTION  09/18/2015   Procedure: SMALL BOWEL RESECTION;  Surgeon: Clayburn Pert, MD;  Location: [facility] OR[facility];  Service: General;;  with anastomosis  . BOWEL RESECTION    . HERNIA REPAIR    . LAPAROSCOPY  09/18/2015   Procedure: LAPAROSCOPY DIAGNOSTIC;  Surgeon: Clayburn Pert, MD;  Location: [facility];  Service: General;;  . LAPAROTOMY N/A 09/18/2015   Procedure: EXPLORATORY LAPAROTOMY;  Surgeon: Clayburn Pert, MD;  Location: [facility];  Service: General;  Laterality: N/A;    Allergies: Review of patient's allergies indicates not on file.  Medications: Prior to Admission medications   Medication Sig Start Date End Date Taking? Authorizing Provider  finasteride (PROSCAR) 5 MG tablet Take 1 tablet (5 mg total) by mouth daily. 06/03/15  Yes Shannon A  McGowan, PA-C  latanoprost (XALATAN) 0.005 % ophthalmic solution Place 1 drop into both eyes every morning.  04/24/15  Yes Historical Provider, MD  lisinopril-hydrochlorothiazide (PRINZIDE,ZESTORETIC) 10-12.5 MG tablet Take 1 tablet by mouth daily. 09/23/15 10/26/16 Yes Richard Leslye Peer, MD  Omega-3 Fatty Acids (FISH OIL) 1200 MG CAPS Take 1,200-2,400 mg by mouth See admin instructions. Take 2 capsules (244m) by mouth every morning and 1 capsule by mouth in the evening.   Yes Historical Provider, MD  shark liver oil-cocoa butter (PREPARATION H) 0.25-3-85.5 % suppository Place 1 suppository rectally daily as needed for hemorrhoids.   Yes Historical Provider, MD  timolol (TIMOPTIC) 0.5 % ophthalmic solution Apply 1 drop to eye every morning.  05/12/13  Yes Historical Provider, MD     Family History  Problem Relation Age of Onset  . Prostate cancer Father   . Cancer Father   . Cancer      family members in general  . Cancer Sister   . Clotting disorder Sister   . Cancer Brother   . Bladder Cancer Neg Hx   . Kidney cancer Neg Hx     Social History   Social History  . Marital status: Single    Spouse name: N/A  . Number of children: N/A  . Years of education: N/A   Social History Main Topics  . Smoking status: Former Smoker    Types: Cigars    Quit date: 04/10/2015  . Smokeless tobacco: Never Used     Comment:  occasional cigar  . Alcohol use 0.0 oz/week     Comment: 1-2 drinks of Liquor Daily.  . Drug use: No  . Sexual activity: Not Asked   Other Topics Concern  . None   Social History Narrative  . None      Review of Systems  Constitutional: Negative for appetite change, chills and fatigue.  Respiratory: Negative.   Cardiovascular: Negative.   Gastrointestinal: Negative for abdominal distention and abdominal pain.  Genitourinary: Negative.     Vital Signs: BP 115/84   Pulse 68   Temp 98 F (36.7 C)   Resp 18   SpO2 97%   Physical Exam  Constitutional: He appears  well-developed.  Cardiovascular: Normal rate, regular rhythm and normal heart sounds.  Exam reveals no gallop and no friction rub.   No murmur heard. Pulmonary/Chest: Effort normal and breath sounds normal. No respiratory distress. He has no wheezes.  Abdominal: Soft. Bowel sounds are normal. He exhibits no distension. There is no tenderness.  Healing midline abdominal incision.      MD Evaluation Airway: WNL Heart: WNL Abdomen: Other (comments) Abdomen comments: Healing midline abdominal incision Chest/ Lungs: WNL ASA  Classification: 2 Mallampati/Airway Score: One  Imaging: Nm Pet Image Initial (pi) Skull Base To Thigh  Result Date: 10/10/2015 CLINICAL DATA:  Initial treatment strategy for diffuse large B-cell lymphoma. EXAM: NUCLEAR MEDICINE PET SKULL BASE TO THIGH TECHNIQUE: 12.7 mCi F-18 FDG was injected intravenously. Full-ring PET imaging was performed from the skull base to thigh after the radiotracer. CT data was obtained and used for attenuation correction and anatomic localization. FASTING BLOOD GLUCOSE:  Value: 106 mg/dl COMPARISON:  CT abdomen and pelvis from 09/16/2015. FINDINGS: NECK Small lymph nodes. No hypermetabolism. Aberrant origin right subclavian artery noted. CHEST No hypermetabolic mediastinal or hilar nodes. No suspicious pulmonary nodules on the CT scan. Low level FDG uptake identified in the right hilum without underlying lymphadenopathy by CT. Insert calcium heart ABDOMEN/PELVIS No abnormal hypermetabolic activity within the liver, pancreas, adrenal glands, or spleen. 2.1 x 1.2 cm right paramidline central mesenteric lymph node (image 200 series 3) is markedly hypermetabolic with SUV max = 72.0. FDG accumulation in the midline scar is compatible with granulation tissue related to healing. While there is some scattered uptake in the colon, more focal uptake is identified in the central sigmoid segment corresponding to an area of advanced diverticular disease (image  234 series 3) and this may be related to physiologic wall activity or some inflammation related to the diverticulosis. As adenoma or adenocarcinoma could also have similar appearance on PET imaging, correlation with colorectal cancer screening recommended. SKELETON No focal hypermetabolic activity to suggest skeletal metastasis. IMPRESSION: Hypermetabolic central mesenteric lymph node consistent with lymphoma. No other convincing sites for hypermetabolic lymphoma in the neck, chest, abdomen, or pelvis. Hypermetabolic focus in the mid sigmoid colon may be physiologic or related to underlying diverticulosis. Correlation with colorectal cancer screening history recommended. Electronically Signed   By: Misty Stanley M.D.   On: 10/10/2015 12:36    Labs:  CBC:  Recent Labs  09/18/15 1742 09/19/15 0429 09/20/15 0417 10/17/15 0950  WBC 12.8* 11.4* 9.5 5.9  HGB 13.0 12.1* 11.4* 12.3*  HCT 38.7* 34.9* 32.6* 35.8*  PLT 192 189 176 202    COAGS:  Recent Labs  06/17/15 0503  INR 1.10  APTT 31    BMP:  Recent Labs  09/16/15 1615 09/17/15 0346 09/18/15 1742 09/19/15 0429 09/20/15 0417  NA 133* 136  --  136 136  K 3.1* 3.5  --  4.3 3.7  CL 96* 95*  --  99* 99*  CO2 27 31  --  27 28  GLUCOSE 111* 112*  --  114* 90  BUN 25* 23*  --  21* 22*  CALCIUM 9.7 9.3  --  8.7* 8.5*  CREATININE 1.10 1.06 1.01 0.96 0.67  GFRNONAA >60 >60 >60 >60 >60  GFRAA >60 >60 >60 >60 >60    LIVER FUNCTION TESTS:  Recent Labs  06/16/15 1000 09/16/15 1615  BILITOT 1.0 0.6  AST 53* 24  ALT 86* 27  ALKPHOS 81 88  PROT 8.2* 7.8  ALBUMIN 3.6 4.1    TUMOR MARKERS:  Recent Labs  09/17/15 0346  CEA 0.9    Assessment and Plan:  75 yo with diffuse large B cell lymphoma and needs bone marrow biopsy as part of work-up.  Explained CT guided biopsy to patient and discussed risks.  Informed consent obtained.  Plan for CT guided bone marrow biopsy with moderate sedation.    Thank you for this  interesting consult.  I greatly enjoyed meeting Zachary Robertson and look forward to participating in their care.  A copy of this report was sent to the requesting provider on this date.  Electronically Signed: Carylon Perches 10/17/2015, 10:17 AM   I spent a total of  15 Minutes   in face to face in clinical consultation, greater than 50% of which was counseling/coordinating care for the bone marrow biopsy.

## 2015-10-17 NOTE — Procedures (Signed)
Successful CT guided bone marrow biopsy.  2 aspirates and 1 core sample obtained.  Minimal blood loss and no immediate complication.  See PACS for full report.

## 2015-10-18 ENCOUNTER — Ambulatory Visit: Payer: PPO | Admitting: Oncology

## 2015-10-24 ENCOUNTER — Encounter: Payer: Self-pay | Admitting: Oncology

## 2015-10-25 ENCOUNTER — Encounter: Payer: Self-pay | Admitting: Oncology

## 2015-10-25 ENCOUNTER — Inpatient Hospital Stay: Payer: PPO | Attending: Oncology | Admitting: Oncology

## 2015-10-25 VITALS — BP 120/74 | HR 61 | Temp 96.6°F | Resp 17 | Ht 70.0 in | Wt 227.0 lb

## 2015-10-25 DIAGNOSIS — C8339 Diffuse large B-cell lymphoma, extranodal and solid organ sites: Secondary | ICD-10-CM

## 2015-10-25 DIAGNOSIS — I10 Essential (primary) hypertension: Secondary | ICD-10-CM | POA: Insufficient documentation

## 2015-10-25 DIAGNOSIS — Z87891 Personal history of nicotine dependence: Secondary | ICD-10-CM | POA: Insufficient documentation

## 2015-10-25 DIAGNOSIS — H409 Unspecified glaucoma: Secondary | ICD-10-CM | POA: Insufficient documentation

## 2015-10-25 DIAGNOSIS — Z79899 Other long term (current) drug therapy: Secondary | ICD-10-CM | POA: Insufficient documentation

## 2015-10-25 DIAGNOSIS — N281 Cyst of kidney, acquired: Secondary | ICD-10-CM | POA: Diagnosis not present

## 2015-10-25 DIAGNOSIS — E669 Obesity, unspecified: Secondary | ICD-10-CM | POA: Insufficient documentation

## 2015-10-25 DIAGNOSIS — K219 Gastro-esophageal reflux disease without esophagitis: Secondary | ICD-10-CM | POA: Diagnosis not present

## 2015-10-25 DIAGNOSIS — N400 Enlarged prostate without lower urinary tract symptoms: Secondary | ICD-10-CM | POA: Insufficient documentation

## 2015-10-25 MED ORDER — PROCHLORPERAZINE MALEATE 10 MG PO TABS: 10.0000 mg | ORAL_TABLET | Freq: Four times a day (QID) | ORAL | 1 refills | Status: DC | PRN

## 2015-10-25 MED ORDER — ONDANSETRON HCL 8 MG PO TABS: 8.0000 mg | ORAL_TABLET | Freq: Two times a day (BID) | ORAL | 1 refills | Status: DC | PRN

## 2015-10-25 MED ORDER — PREDNISONE 20 MG PO TABS
100.0000 mg | ORAL_TABLET | Freq: Every day | ORAL | 5 refills | Status: DC
Start: 2015-10-25 — End: 2015-11-22

## 2015-10-25 NOTE — Progress Notes (Signed)
.  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 He currently feels well and is asymptomatic. Patient denies any weight loss, fevers, night sweats, or other B symptoms. He denies any abdominal pain. He has no nausea, vomiting, constipation, or diarrhea.  He denies and chest pain or shortness of breath.  He has no neurological complaints. He has no urinary complaints. Patient offers no specific complaints today.  REVIEW OF SYSTEMS:   Review of Systems  Constitutional: Negative for fever, malaise/fatigue and weight loss.  Respiratory: Negative.  Negative for cough and hemoptysis.   Cardiovascular: Negative.  Negative for chest pain.  Gastrointestinal: Negative.  Negative for abdominal pain, blood in stool, melena, nausea and vomiting.  Musculoskeletal: Negative.   Neurological: Negative.  Negative for weakness.  Endo/Heme/Allergies: Does not bruise/bleed easily.  Psychiatric/Behavioral: The patient is not nervous/anxious.     As per HPI. Otherwise, a complete review of systems is negatve.  PAST MEDICAL HISTORY: Past Medical History:  Diagnosis Date  . Acid reflux 06/03/2015  . Adenomatous colon polyp 08/12/2014   Overview:  On 05/25/2011 colonoscopy; repeat 05/2016 (Dr. Gustavo Lah)   . BPH (benign prostatic hyperplasia)   . Cancer ([place])    LYMPHOMA  . Dribbling   . ED (erectile dysfunction)   . Essential (primary) hypertension 06/03/2015  . GERD (gastroesophageal reflux disease)   . Glaucoma 08/07/2013  . Hesitancy   . HTN (hypertension)   . Hypertension   . Hypogonadism in male   .  Nocturia   . Obese   . Urgency-frequency syndrome     PAST SURGICAL HISTORY: Past Surgical History:  Procedure Laterality Date  . BOWEL RESECTION  09/18/2015   Procedure: SMALL BOWEL RESECTION;  Surgeon: Clayburn Pert, MD;  Location: [facility] ORS;  Service: General;;  with anastomosis  . BOWEL RESECTION    . HERNIA REPAIR    . LAPAROSCOPY  09/18/2015   Procedure: LAPAROSCOPY DIAGNOSTIC;  Surgeon: Clayburn Pert, MD;  Location: [facility] ORS;  Service: General;;  . LAPAROTOMY N/A 09/18/2015   Procedure: EXPLORATORY LAPAROTOMY;  Surgeon: Clayburn Pert, MD;  Location: [facility] ORS;  Service: General;  Laterality: N/A;    FAMILY HISTORY: Family History  Problem Relation Age of Onset  . Prostate cancer Father   . Cancer Father   . Cancer      family members in general  . Cancer Sister   . Clotting disorder Sister   . Cancer Brother   . Bladder Cancer Neg Hx   . Kidney cancer Neg Hx        ADVANCED DIRECTIVES:    HEALTH MAINTENANCE: Social History  Substance Use Topics  . Smoking status: Former Smoker    Types: Cigars    Quit date: 04/10/2015  . Smokeless tobacco: Never Used     Comment: occasional cigar  . Alcohol use 0.0 oz/week     Comment: 1-2 drinks of Liquor Daily.     Colonoscopy:  PAP:  Bone density:  Lipid panel:  No Known Allergies  Current Outpatient Prescriptions  Medication Sig Dispense Refill  . finasteride (PROSCAR) 5 MG tablet Take 1 tablet (5 mg total) by mouth daily.  90 tablet 3  . latanoprost (XALATAN) 0.005 % ophthalmic solution Place 1 drop into both eyes every morning.     Marland Kitchen lisinopril-hydrochlorothiazide (PRINZIDE,ZESTORETIC) 10-12.5 MG tablet Take 1 tablet by mouth daily. 30 tablet 0  . Omega-3 Fatty Acids (FISH OIL) 1200 MG CAPS Take 1,200-2,400 mg by mouth See admin instructions. Take 2 capsules (2424m) by mouth every morning and 1 capsule by mouth in the evening.    . shark liver oil-cocoa butter (PREPARATION H) 0.25-3-85.5 % suppository Place 1  suppository rectally daily as needed for hemorrhoids.    . timolol (TIMOPTIC) 0.5 % ophthalmic solution Apply 1 drop to eye every morning.      No current facility-administered medications for this visit.     OBJECTIVE: Vitals:   10/25/15 1126  BP: 120/74  Pulse: 61  Resp: 17  Temp: (!) 96.6 F (35.9 C)     Body mass index is 32.57 kg/m.    ECOG FS:0 - Asymptomatic  General: Well-developed, well-nourished, no acute distress. Eyes: Pink conjunctiva, anicteric sclera. Lungs: Clear to auscultation bilaterally. Heart: Regular rate and rhythm. No rubs, murmurs, or gallops. Abdomen: Soft, nontender, nondistended. No organomegaly noted, normoactive bowel sounds. Musculoskeletal: No edema, cyanosis, or clubbing. Neuro: Alert, answering all questions appropriately. Cranial nerves grossly intact. Skin: No rashes or petechiae noted. Psych: Normal affect. Lymphatics: No cervical, calvicular, axillary or inguinal LAD.   LAB RESULTS:  Lab Results  Component Value Date   NA 136 09/20/2015   K 3.7 09/20/2015   CL 99 (L) 09/20/2015   CO2 28 09/20/2015   GLUCOSE 90 09/20/2015   BUN 22 (H) 09/20/2015   CREATININE 0.67 09/20/2015   CALCIUM 8.5 (L) 09/20/2015   PROT 7.8 09/16/2015   ALBUMIN 4.1 09/16/2015   AST 24 09/16/2015   ALT 27 09/16/2015   ALKPHOS 88 09/16/2015   BILITOT 0.6 09/16/2015   GFRNONAA >60 09/20/2015   GFRAA >60 09/20/2015    Lab Results  Component Value Date   WBC 5.9 10/17/2015   NEUTROABS 2.9 10/17/2015   HGB 12.3 (L) 10/17/2015   HCT 35.8 (L) 10/17/2015   MCV 88.1 10/17/2015   PLT 202 10/17/2015     STUDIES: Nm Pet Image Initial (pi) Skull Base To Thigh  Result Date: 10/10/2015 CLINICAL DATA:  Initial treatment strategy for diffuse large B-cell lymphoma. EXAM: NUCLEAR MEDICINE PET SKULL BASE TO THIGH TECHNIQUE: 12.7 mCi F-18 FDG was injected intravenously. Full-ring PET imaging was performed from the skull base to thigh after the radiotracer. CT data  was obtained and used for attenuation correction and anatomic localization. FASTING BLOOD GLUCOSE:  Value: 106 mg/dl COMPARISON:  CT abdomen and pelvis from 09/16/2015. FINDINGS: NECK Small lymph nodes. No hypermetabolism. Aberrant origin right subclavian artery noted. CHEST No hypermetabolic mediastinal or hilar nodes. No suspicious pulmonary nodules on the CT scan. Low level FDG uptake identified in the right hilum without underlying lymphadenopathy by CT. Insert calcium heart ABDOMEN/PELVIS No abnormal hypermetabolic activity within the liver, pancreas, adrenal glands, or spleen. 2.1 x 1.2 cm right paramidline central mesenteric lymph node (image 200 series 3) is markedly hypermetabolic with SUV max = 111.9 FDG accumulation in the midline scar is compatible with granulation tissue related to healing. While there is some scattered uptake in the colon, more focal uptake is identified in the central sigmoid segment corresponding to an area of advanced diverticular disease (image 234 series 3) and this may be related to physiologic wall activity or some inflammation related to the diverticulosis.  As adenoma or adenocarcinoma could also have similar appearance on PET imaging, correlation with colorectal cancer screening recommended. SKELETON No focal hypermetabolic activity to suggest skeletal metastasis. IMPRESSION: Hypermetabolic central mesenteric lymph node consistent with lymphoma. No other convincing sites for hypermetabolic lymphoma in the neck, chest, abdomen, or pelvis. Hypermetabolic focus in the mid sigmoid colon may be physiologic or related to underlying diverticulosis. Correlation with colorectal cancer screening history recommended. Electronically Signed   By: Misty Stanley M.D.   On: 10/10/2015 12:36   Ct Biopsy  Result Date: 10/17/2015 INDICATION: 75 year old with a diffuse large B-cell lymphoma. EXAM: CT GUIDED BONE MARROW ASPIRATES AND BIOPSY Physician: Stephan Minister. Anselm Pancoast, MD MEDICATIONS: None.  ANESTHESIA/SEDATION: Fentanyl 2.0 mcg IV; Versed 75 mg IV Moderate Sedation Time:  20 minutes The patient was continuously monitored during the procedure by the interventional radiology nurse under my direct supervision. COMPLICATIONS: None immediate. PROCEDURE: The procedure was explained to the patient. The risks and benefits of the procedure were discussed and the patient's questions were addressed. Informed consent was obtained from the patient. The patient was placed prone on CT scan. Images of the pelvis were obtained. The right side of back was prepped and draped in sterile fashion. The skin and right posterior iliac bone were anesthetized with 1% lidocaine. 11 gauge bone needle was directed into the right iliac bone with CT guidance. Two aspirates and one core biopsy obtained. Bandage placed over the puncture site. FINDINGS: Bone needle directed into the posterior right ilium. IMPRESSION: CT guided bone marrow aspirates and core biopsy. Electronically Signed   By: Markus Daft M.D.   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 Because of the extensive extranodal involvement of the terminal illium, this is considered stage IV disease. PET scan results reviewed independently and reported as above with no obvious disease elsewhere in patient's body. Bone marrow biopsy was negative. After lengthy discussion with the patient, he has agreed to pursue adjuvant chemotherapy using Rituxan plus CVP. Plan to give a total of 6 treatments every 3 weeks. Patient has declined port placement. Patient would like to delay chemotherapy until after his surgical follow-up as well as a dental procedure in the next several weeks, therefore he return to clinic in 4 weeks to initiate cycle 1, day  1.  Approximately 30 minutes was spent in discussion of which greater than 50% was consultation.  Patient expressed understanding and was in agreement with this plan. He also understands that He can call clinic at any time with any questions, concerns, or complaints.   Diffuse large B cell lymphoma ([place])   Staging form: Lymphoid Neoplasms, AJCC 6th Edition   - Clinical stage from 10/09/2015: Stage IV - Signed by Lloyd Huger, MD on 10/09/2015  Lloyd Huger, MD   10/25/2015 6:18 PM

## 2015-10-25 NOTE — Progress Notes (Signed)
No changes since last visit.  Here for PET and bone marrow results.

## 2015-11-01 ENCOUNTER — Ambulatory Visit (INDEPENDENT_AMBULATORY_CARE_PROVIDER_SITE_OTHER): Payer: PPO | Admitting: General Surgery

## 2015-11-01 ENCOUNTER — Encounter: Payer: Self-pay | Admitting: General Surgery

## 2015-11-01 VITALS — BP 125/78 | HR 60 | Temp 98.1°F | Wt 226.0 lb

## 2015-11-01 DIAGNOSIS — Z4889 Encounter for other specified surgical aftercare: Secondary | ICD-10-CM

## 2015-11-01 NOTE — Patient Instructions (Signed)
Please give us a call if you have any questions or concerns. 

## 2015-11-01 NOTE — Progress Notes (Signed)
Outpatient Surgical Follow Up  11/01/2015  Zachary Robertson is an 75 y.o. male.   No chief complaint on file.   HPI: 75 year old male returns to clinic 6 weeks status post exploratory laparotomy with small bowel resection for B-cell lymphoma. He's been doing very well. He denies any pain. He denies any fevers, chills, nausea, vomiting, chest pain, shortness breath, diarrhea, constipation. He is already return to his normal activities and plate round of golf last weekend.  Past Medical History:  Diagnosis Date  . Acid reflux 06/03/2015  . Adenomatous colon polyp 08/12/2014   Overview:  On 05/25/2011 colonoscopy; repeat 05/2016 (Dr. Gustavo Lah)   . BPH (benign prostatic hyperplasia)   . Cancer ([place])    LYMPHOMA  . Dribbling   . ED (erectile dysfunction)   . Essential (primary) hypertension 06/03/2015  . GERD (gastroesophageal reflux disease)   . Glaucoma 08/07/2013  . Hesitancy   . HTN (hypertension)   . Hypertension   . Hypogonadism in male   . Nocturia   . Obese   . Urgency-frequency syndrome     Past Surgical History:  Procedure Laterality Date  . BOWEL RESECTION  09/18/2015   Procedure: SMALL BOWEL RESECTION;  Surgeon: Clayburn Pert, MD;  Location: [facility] ORS;  Service: General;;  with anastomosis  . BOWEL RESECTION    . HERNIA REPAIR    . LAPAROSCOPY  09/18/2015   Procedure: LAPAROSCOPY DIAGNOSTIC;  Surgeon: Clayburn Pert, MD;  Location: [facility] ORS;  Service: General;;  . LAPAROTOMY N/A 09/18/2015   Procedure: EXPLORATORY LAPAROTOMY;  Surgeon: Clayburn Pert, MD;  Location: [facility] ORS;  Service: General;  Laterality: N/A;    Family History  Problem Relation Age of Onset  . Prostate cancer Father   . Cancer Father   . Cancer      family members in general  . Cancer Sister   . Clotting disorder Sister   . Cancer Brother   . Bladder Cancer Neg Hx   . Kidney cancer Neg Hx     Social History:  reports that he quit smoking about 6 months ago. His smoking use included Cigars. He  has never used smokeless tobacco. He reports that he drinks alcohol. He reports that he does not use drugs.  Allergies: No Known Allergies  Medications reviewed.    ROS A multipoint review of systems was completed, all pertinent positives and negatives are documented within the history of present illness and remainder are negative.   BP 125/78 (BP Location: Left Arm, Patient Position: Sitting, Cuff Size: Large)   Pulse 60   Temp 98.1 F (36.7 C) (Oral)   Wt 102.5 kg (226 lb)   BMI 32.43 kg/m   Physical Exam Gen.: No acute distress Chest: Clear to auscultation Heart: Regular rhythm Abdomen: Soft and nontender, nondistended. Well healed midline incision without any evidence of erythema or drainage.    No results found for this or any previous visit (from the past 48 hour(s)). No results found.  Assessment/Plan:  1. Aftercare following surgery 75 year old male 6 weeks status post laparotomy with removal of obstructing small bowel B-cell lymphoma. Doing very well. Discussed the signs and symptoms of hernia and/or infection. All questions answered the patient's satisfaction. They'll follow-up in clinic on an as-needed basis.     Clayburn Pert, MD [facility] General Surgeon  11/01/2015,11:43 AM

## 2015-11-07 NOTE — Patient Instructions (Signed)
Rituximab injection What is this medicine? RITUXIMAB (ri TUX i mab) is a monoclonal antibody. It is used commonly to treat non-Hodgkin lymphoma and other conditions. It is also used to treat rheumatoid arthritis (RA). In RA, this medicine slows the inflammatory process and help reduce joint pain and swelling. This medicine is often used with other cancer or arthritis medications. This medicine may be used for other purposes; ask your health care provider or pharmacist if you have questions. What should I tell my health care provider before I take this medicine? They need to know if you have any of these conditions: -blood disorders -heart disease -history of hepatitis B -infection (especially a virus infection such as chickenpox, cold sores, or herpes) -irregular heartbeat -kidney disease -lung or breathing disease, like asthma -lupus -an unusual or allergic reaction to rituximab, mouse proteins, other medicines, foods, dyes, or preservatives -pregnant or trying to get pregnant -breast-feeding How should I use this medicine? This medicine is for infusion into a vein. It is administered in a hospital or clinic by a specially trained health care professional. A special MedGuide will be given to you by the pharmacist with each prescription and refill. Be sure to read this information carefully each time. Talk to your pediatrician regarding the use of this medicine in children. This medicine is not approved for use in children. Overdosage: If you think you have taken too much of this medicine contact a poison control center or emergency room at once. NOTE: This medicine is only for you. Do not share this medicine with others. What if I miss a dose? It is important not to miss a dose. Call your doctor or health care professional if you are unable to keep an appointment. What may interact with this medicine? -cisplatin -medicines for blood pressure -some other medicines for  arthritis -vaccines This list may not describe all possible interactions. Give your health care provider a list of all the medicines, herbs, non-prescription drugs, or dietary supplements you use. Also tell them if you smoke, drink alcohol, or use illegal drugs. Some items may interact with your medicine. What should I watch for while using this medicine? Report any side effects that you notice during your treatment right away, such as changes in your breathing, fever, chills, dizziness or lightheadedness. These effects are more common with the first dose. Visit your prescriber or health care professional for checks on your progress. You will need to have regular blood work. Report any other side effects. The side effects of this medicine can continue after you finish your treatment. Continue your course of treatment even though you feel ill unless your doctor tells you to stop. Call your doctor or health care professional for advice if you get a fever, chills or sore throat, or other symptoms of a cold or flu. Do not treat yourself. This drug decreases your body's ability to fight infections. Try to avoid being around people who are sick. This medicine may increase your risk to bruise or bleed. Call your doctor or health care professional if you notice any unusual bleeding. Be careful brushing and flossing your teeth or using a toothpick because you may get an infection or bleed more easily. If you have any dental work done, tell your dentist you are receiving this medicine. Avoid taking products that contain aspirin, acetaminophen, ibuprofen, naproxen, or ketoprofen unless instructed by your doctor. These medicines may hide a fever. Do not become pregnant while taking this medicine. Women should inform their doctor if  they wish to become pregnant or think they might be pregnant. There is a potential for serious side effects to an unborn child. Talk to your health care professional or pharmacist for more  information. Do not breast-feed an infant while taking this medicine. What side effects may I notice from receiving this medicine? Side effects that you should report to your doctor or health care professional as soon as possible: -allergic reactions like skin rash, itching or hives, swelling of the face, lips, or tongue -low blood counts - this medicine may decrease the number of white blood cells, red blood cells and platelets. You may be at increased risk for infections and bleeding. -signs of infection - fever or chills, cough, sore throat, pain or difficulty passing urine -signs of decreased platelets or bleeding - bruising, pinpoint red spots on the skin, black, tarry stools, blood in the urine -signs of decreased red blood cells - unusually weak or tired, fainting spells, lightheadedness -breathing problems -confused, not responsive -chest pain -fast, irregular heartbeat -feeling faint or lightheaded, falls -mouth sores -redness, blistering, peeling or loosening of the skin, including inside the mouth -stomach pain -swelling of the ankles, feet, or hands -trouble passing urine or change in the amount of urine Side effects that usually do not require medical attention (report to your doctor or other health care professional if they continue or are bothersome): -anxiety -headache -loss of appetite -muscle aches -nausea -night sweats This list may not describe all possible side effects. Call your doctor for medical advice about side effects. You may report side effects to FDA at 1-800-FDA-1088. Where should I keep my medicine? This drug is given in a hospital or clinic and will not be stored at home. NOTE: This sheet is a summary. It may not cover all possible information. If you have questions about this medicine, talk to your doctor, pharmacist, or health care provider.    2016, [employer]. (2014-05-16 22:30:56) Vincristine injection What is this  medicine? VINCRISTINE (vin KRIS teen) is a chemotherapy drug. It slows the growth of cancer cells. This medicine is used to treat many types of cancer like Hodgkin's disease, leukemia, non-Hodgkin's lymphoma, neuroblastoma (brain cancer), rhabdomyosarcoma, and Wilms' tumor. This medicine may be used for other purposes; ask your health care provider or pharmacist if you have questions. What should I tell my health care provider before I take this medicine? They need to know if you have any of these conditions: -blood disorders -gout -infection (especially chickenpox, cold sores, or herpes) -kidney disease -liver disease -lung disease -nervous system disease like Charcot-Marie-Tooth (CMT) -recent or ongoing radiation therapy -an unusual or allergic reaction to vincristine, other chemotherapy agents, other medicines, foods, dyes, or preservatives -pregnant or trying to get pregnant -breast-feeding How should I use this medicine? This drug is given as an infusion into a vein. It is administered in a hospital or clinic by a specially trained health care professional. If you have pain, swelling, burning, or any unusual feeling around the site of your injection, tell your health care professional right away. Talk to your pediatrician regarding the use of this medicine in children. While this drug may be prescribed for selected conditions, precautions do apply. Overdosage: If you think you have taken too much of this medicine contact a poison control center or emergency room at once. NOTE: This medicine is only for you. Do not share this medicine with others. What if I miss a dose? It is important not to miss your dose. Call  your doctor or health care professional if you are unable to keep an appointment. What may interact with this medicine? Do not take this medicine with any of the following medications: -itraconazole -mibefradil -voriconazole This medicine may also interact with the following  medications: -cyclosporine -erythromycin -fluconazole -ketoconazole -medicines for HIV like delavirdine, efavirenz, nevirapine -medicines for seizures like ethotoin, fosphenotoin, phenytoin -medicines to increase blood counts like filgrastim, pegfilgrastim, sargramostim -other chemotherapy drugs like cisplatin, L-asparaginase, methotrexate, mitomycin, paclitaxel -pegaspargase -vaccines -zalcitabine, ddC Talk to your doctor or health care professional before taking any of these medicines: -acetaminophen -aspirin -ibuprofen -ketoprofen -naproxen This list may not describe all possible interactions. Give your health care provider a list of all the medicines, herbs, non-prescription drugs, or dietary supplements you use. Also tell them if you smoke, drink alcohol, or use illegal drugs. Some items may interact with your medicine. What should I watch for while using this medicine? Your condition will be monitored carefully while you are receiving this medicine. You will need important blood work done while you are taking this medicine. This drug may make you feel generally unwell. This is not uncommon, as chemotherapy can affect healthy cells as well as cancer cells. Report any side effects. Continue your course of treatment even though you feel ill unless your doctor tells you to stop. In some cases, you may be given additional medicines to help with side effects. Follow all directions for their use. Call your doctor or health care professional for advice if you get a fever, chills or sore throat, or other symptoms of a cold or flu. Do not treat yourself. Avoid taking products that contain aspirin, acetaminophen, ibuprofen, naproxen, or ketoprofen unless instructed by your doctor. These medicines may hide a fever. Do not become pregnant while taking this medicine. Women should inform their doctor if they wish to become pregnant or think they might be pregnant. There is a potential for serious  side effects to an unborn child. Talk to your health care professional or pharmacist for more information. Do not breast-feed an infant while taking this medicine. Men may have a lower sperm count while taking this medicine. Talk to your doctor if you plan to father a child. What side effects may I notice from receiving this medicine? Side effects that you should report to your doctor or health care professional as soon as possible: -allergic reactions like skin rash, itching or hives, swelling of the face, lips, or tongue -breathing problems -confusion or changes in emotions or moods -constipation -cough -mouth sores -muscle weakness -nausea and vomiting -pain, swelling, redness or irritation at the injection site -pain, tingling, numbness in the hands or feet -problems with balance, talking, walking -seizures -stomach pain -trouble passing urine or change in the amount of urine Side effects that usually do not require medical attention (report to your doctor or health care professional if they continue or are bothersome): -diarrhea -hair loss -jaw pain -loss of appetite This list may not describe all possible side effects. Call your doctor for medical advice about side effects. You may report side effects to FDA at 1-800-FDA-1088. Where should I keep my medicine? This drug is given in a hospital or clinic and will not be stored at home. NOTE: This sheet is a summary. It may not cover all possible information. If you have questions about this medicine, talk to your doctor, pharmacist, or health care provider.    2016, [employer]. (2007-12-05 17:17:13) Cyclophosphamide injection What is this medicine? CYCLOPHOSPHAMIDE (sye kloe   FOSS fa mide) is a chemotherapy drug. It slows the growth of cancer cells. This medicine is used to treat many types of cancer like lymphoma, myeloma, leukemia, breast cancer, and ovarian cancer, to name a few. This medicine may be used for other  purposes; ask your health care provider or pharmacist if you have questions. What should I tell my health care provider before I take this medicine? They need to know if you have any of these conditions: -blood disorders -history of other chemotherapy -infection -kidney disease -liver disease -recent or ongoing radiation therapy -tumors in the bone marrow -an unusual or allergic reaction to cyclophosphamide, other chemotherapy, other medicines, foods, dyes, or preservatives -pregnant or trying to get pregnant -breast-feeding How should I use this medicine? This drug is usually given as an injection into a vein or muscle or by infusion into a vein. It is administered in a hospital or clinic by a specially trained health care professional. Talk to your pediatrician regarding the use of this medicine in children. Special care may be needed. Overdosage: If you think you have taken too much of this medicine contact a poison control center or emergency room at once. NOTE: This medicine is only for you. Do not share this medicine with others. What if I miss a dose? It is important not to miss your dose. Call your doctor or health care professional if you are unable to keep an appointment. What may interact with this medicine? This medicine may interact with the following medications: -amiodarone -amphotericin B -azathioprine -certain antiviral medicines for HIV or AIDS such as protease inhibitors (e.g., indinavir, ritonavir) and zidovudine -certain blood pressure medications such as benazepril, captopril, enalapril, fosinopril, lisinopril, moexipril, monopril, perindopril, quinapril, ramipril, trandolapril -certain cancer medications such as anthracyclines (e.g., daunorubicin, doxorubicin), busulfan, cytarabine, paclitaxel, pentostatin, tamoxifen, trastuzumab -certain diuretics such as chlorothiazide, chlorthalidone, hydrochlorothiazide, indapamide, metolazone -certain medicines that treat or  prevent blood clots like warfarin -certain muscle relaxants such as succinylcholine -cyclosporine -etanercept -indomethacin -medicines to increase blood counts like filgrastim, pegfilgrastim, sargramostim -medicines used as general anesthesia -metronidazole -natalizumab This list may not describe all possible interactions. Give your health care provider a list of all the medicines, herbs, non-prescription drugs, or dietary supplements you use. Also tell them if you smoke, drink alcohol, or use illegal drugs. Some items may interact with your medicine. What should I watch for while using this medicine? Visit your doctor for checks on your progress. This drug may make you feel generally unwell. This is not uncommon, as chemotherapy can affect healthy cells as well as cancer cells. Report any side effects. Continue your course of treatment even though you feel ill unless your doctor tells you to stop. Drink water or other fluids as directed. Urinate often, even at night. In some cases, you may be given additional medicines to help with side effects. Follow all directions for their use. Call your doctor or health care professional for advice if you get a fever, chills or sore throat, or other symptoms of a cold or flu. Do not treat yourself. This drug decreases your body's ability to fight infections. Try to avoid being around people who are sick. This medicine may increase your risk to bruise or bleed. Call your doctor or health care professional if you notice any unusual bleeding. Be careful brushing and flossing your teeth or using a toothpick because you may get an infection or bleed more easily. If you have any dental work done, tell your dentist you are   receiving this medicine. You may get drowsy or dizzy. Do not drive, use machinery, or do anything that needs mental alertness until you know how this medicine affects you. Do not become pregnant while taking this medicine or for 1 year after  stopping it. Women should inform their doctor if they wish to become pregnant or think they might be pregnant. Men should not father a child while taking this medicine and for 4 months after stopping it. There is a potential for serious side effects to an unborn child. Talk to your health care professional or pharmacist for more information. Do not breast-feed an infant while taking this medicine. This medicine may interfere with the ability to have a child. This medicine has caused ovarian failure in some women. This medicine has caused reduced sperm counts in some men. You should talk with your doctor or health care professional if you are concerned about your fertility. If you are going to have surgery, tell your doctor or health care professional that you have taken this medicine. What side effects may I notice from receiving this medicine? Side effects that you should report to your doctor or health care professional as soon as possible: -allergic reactions like skin rash, itching or hives, swelling of the face, lips, or tongue -low blood counts - this medicine may decrease the number of white blood cells, red blood cells and platelets. You may be at increased risk for infections and bleeding. -signs of infection - fever or chills, cough, sore throat, pain or difficulty passing urine -signs of decreased platelets or bleeding - bruising, pinpoint red spots on the skin, black, tarry stools, blood in the urine -signs of decreased red blood cells - unusually weak or tired, fainting spells, lightheadedness -breathing problems -dark urine -dizziness -palpitations -swelling of the ankles, feet, hands -trouble passing urine or change in the amount of urine -weight gain -yellowing of the eyes or skin Side effects that usually do not require medical attention (report to your doctor or health care professional if they continue or are bothersome): -changes in nail or skin color -hair loss -missed  menstrual periods -mouth sores -nausea, vomiting This list may not describe all possible side effects. Call your doctor for medical advice about side effects. You may report side effects to FDA at 1-800-FDA-1088. Where should I keep my medicine? This drug is given in a hospital or clinic and will not be stored at home. NOTE: This sheet is a summary. It may not cover all possible information. If you have questions about this medicine, talk to your doctor, pharmacist, or health care provider.    2016, [employer]. (2012-01-22 16:22:58)  

## 2015-11-10 ENCOUNTER — Encounter: Payer: Self-pay | Admitting: Oncology

## 2015-11-12 ENCOUNTER — Inpatient Hospital Stay: Payer: PPO

## 2015-11-21 NOTE — Progress Notes (Signed)
.  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 He continues to feel well and is asymptomatic. Patient denies any weight loss, fevers, night sweats, or other B symptoms. He denies any abdominal pain. He has no nausea, vomiting, constipation, or diarrhea.  He denies and chest pain or shortness of breath.  He has no neurological complaints. He has no urinary complaints. Patient offers no specific complaints today.  REVIEW OF SYSTEMS:   Review of Systems  Constitutional: Negative for fever, malaise/fatigue and weight loss.  Respiratory: Negative.  Negative for cough and hemoptysis.   Cardiovascular: Negative.  Negative for chest pain.  Gastrointestinal: Negative.  Negative for abdominal pain, blood in stool, melena, nausea and vomiting.  Musculoskeletal: Negative.   Neurological: Negative.  Negative for weakness.  Endo/Heme/Allergies: Does not bruise/bleed easily.  Psychiatric/Behavioral: The patient is not nervous/anxious.     As per HPI. Otherwise, a complete review of systems is negatve.  PAST MEDICAL HISTORY: Past Medical History:  Diagnosis Date  . Acid reflux 06/03/2015  . Adenomatous colon polyp 08/12/2014   Overview:  On 05/25/2011 colonoscopy; repeat 05/2016 (Dr. Gustavo Lah)   . BPH (benign prostatic hyperplasia)   . Cancer ([place])    LYMPHOMA  . Dribbling   . ED (erectile dysfunction)   . Essential (primary) hypertension 06/03/2015  . GERD (gastroesophageal reflux disease)   . Glaucoma 08/07/2013  . Hesitancy   . HTN (hypertension)   . Hypertension   . Hypogonadism in male   .  Nocturia   . Obese   . Urgency-frequency syndrome     PAST SURGICAL HISTORY: Past Surgical History:  Procedure Laterality Date  . BOWEL RESECTION  09/18/2015   Procedure: SMALL BOWEL RESECTION;  Surgeon: Clayburn Pert, MD;  Location: [facility] ORS;  Service: General;;  with anastomosis  . BOWEL RESECTION    . HERNIA REPAIR    . LAPAROSCOPY  09/18/2015   Procedure: LAPAROSCOPY DIAGNOSTIC;  Surgeon: Clayburn Pert, MD;  Location: [facility] ORS;  Service: General;;  . LAPAROTOMY N/A 09/18/2015   Procedure: EXPLORATORY LAPAROTOMY;  Surgeon: Clayburn Pert, MD;  Location: [facility] ORS;  Service: General;  Laterality: N/A;    FAMILY HISTORY: Family History  Problem Relation Age of Onset  . Prostate cancer Father   . Cancer Father   . Cancer      family members in general  . Cancer Sister   . Clotting disorder Sister   . Cancer Brother   . Bladder Cancer Neg Hx   . Kidney cancer Neg Hx        ADVANCED DIRECTIVES:    HEALTH MAINTENANCE: Social History  Substance Use Topics  . Smoking status: Former Smoker    Types: Cigars    Quit date: 04/10/2015  . Smokeless tobacco: Never Used     Comment: occasional cigar  . Alcohol use 0.0 oz/week     Comment: 1-2 drinks of Liquor Daily.     Colonoscopy:  PAP:  Bone density:  Lipid panel:  No Known Allergies  Current Outpatient Prescriptions  Medication Sig Dispense Refill  . chlorhexidine (PERIDEX) 0.12 % solution 1 mL by Mouth Rinse route  1 day or 1 dose.    . finasteride (PROSCAR) 5 MG tablet Take 1 tablet (5 mg total) by mouth daily. 90 tablet 3  . latanoprost (XALATAN) 0.005 % ophthalmic solution Place 1 drop into both eyes every morning.     Marland Kitchen lisinopril-hydrochlorothiazide (PRINZIDE,ZESTORETIC) 10-12.5 MG tablet Take 1 tablet by mouth daily. 30 tablet 0  . Omega-3 Fatty Acids (FISH OIL) 1200 MG CAPS Take 1,200-2,400 mg by mouth See admin instructions. Take 2 capsules (247m) by mouth every morning and 1 capsule by mouth in the  evening.    . ondansetron (ZOFRAN) 8 MG tablet Take 1 tablet (8 mg total) by mouth 2 (two) times daily as needed for refractory nausea / vomiting. Start on day 3 after chemotherapy. 30 tablet 1  . predniSONE (DELTASONE) 20 MG tablet Take 5 tablets (100 mg total) by mouth daily. Take on days 1-5 of each chemotherapy cycle. 25 tablet 5  . prochlorperazine (COMPAZINE) 10 MG tablet Take 1 tablet (10 mg total) by mouth every 6 (six) hours as needed (Nausea or vomiting). 30 tablet 1  . shark liver oil-cocoa butter (PREPARATION H) 0.25-3-85.5 % suppository Place 1 suppository rectally daily as needed for hemorrhoids.    . timolol (TIMOPTIC) 0.5 % ophthalmic solution Apply 1 drop to eye every morning.      No current facility-administered medications for this visit.     OBJECTIVE: Vitals:   11/22/15 0906  BP: (!) 144/74  Pulse: 70  Resp: 18  Temp: (!) 96.5 F (35.8 C)     Body mass index is 33.45 kg/m.    ECOG FS:0 - Asymptomatic  General: Well-developed, well-nourished, no acute distress. Eyes: Pink conjunctiva, anicteric sclera. Lungs: Clear to auscultation bilaterally. Heart: Regular rate and rhythm. No rubs, murmurs, or gallops. Abdomen: Soft, nontender, nondistended. No organomegaly noted, normoactive bowel sounds. Musculoskeletal: No edema, cyanosis, or clubbing. Neuro: Alert, answering all questions appropriately. Cranial nerves grossly intact. Skin: No rashes or petechiae noted. Psych: Normal affect. Lymphatics: No cervical, calvicular, axillary or inguinal LAD.   LAB RESULTS:  Lab Results  Component Value Date   NA 138 11/22/2015   K 3.9 11/22/2015   CL 105 11/22/2015   CO2 25 11/22/2015   GLUCOSE 153 (H) 11/22/2015   BUN 25 (H) 11/22/2015   CREATININE 1.03 11/22/2015   CALCIUM 9.5 11/22/2015   PROT 7.4 11/22/2015   ALBUMIN 4.2 11/22/2015   AST 19 11/22/2015   ALT 18 11/22/2015   ALKPHOS 92 11/22/2015   BILITOT 0.6 11/22/2015   GFRNONAA >60 11/22/2015   GFRAA >60  11/22/2015    Lab Results  Component Value Date   WBC 5.7 11/22/2015   NEUTROABS 3.1 11/22/2015   HGB 13.2 11/22/2015   HCT 39.7 (L) 11/22/2015   MCV 87.0 11/22/2015   PLT 196 11/22/2015     STUDIES: No results found.  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 Because of the extensive extranodal involvement of the terminal illium, this is considered stage IV disease. PET scan results reviewed independently with no obvious disease elsewhere in patient's body. Bone marrow biopsy was negative. After lengthy discussion with the patient, he has agreed to pursue adjuvant chemotherapy using Rituxan plus CVP. Plan to give a total of 6 treatments every 3 weeks. Patient has declined port placement. Proceed with cycle  1 of 6 of Rituxan plus CVP. Return to clinic in 1 week for laboratory work and in 3 weeks for consideration of cycle 2.   Approximately 30 minutes was spent in discussion of which greater than 50% was consultation.  Patient expressed understanding and was in agreement with this plan. He also understands that He can call clinic at any time with any questions, concerns, or complaints.   Diffuse large B cell lymphoma ([place])   Staging form: Lymphoid Neoplasms, AJCC 6th Edition   - Clinical stage from 10/09/2015: Stage IV - Signed by Lloyd Huger, MD on 10/09/2015  Lloyd Huger, MD   11/22/2015 1:32 PM

## 2015-11-22 ENCOUNTER — Inpatient Hospital Stay: Payer: PPO

## 2015-11-22 ENCOUNTER — Inpatient Hospital Stay: Payer: PPO | Attending: Oncology

## 2015-11-22 ENCOUNTER — Inpatient Hospital Stay (HOSPITAL_BASED_OUTPATIENT_CLINIC_OR_DEPARTMENT_OTHER): Payer: PPO | Admitting: Oncology

## 2015-11-22 VITALS — BP 144/74 | HR 70 | Temp 96.5°F | Resp 18 | Wt 233.1 lb

## 2015-11-22 VITALS — BP 133/71 | HR 72 | Temp 95.6°F | Resp 18

## 2015-11-22 DIAGNOSIS — E669 Obesity, unspecified: Secondary | ICD-10-CM | POA: Diagnosis not present

## 2015-11-22 DIAGNOSIS — C8339 Diffuse large B-cell lymphoma, extranodal and solid organ sites: Secondary | ICD-10-CM

## 2015-11-22 DIAGNOSIS — N529 Male erectile dysfunction, unspecified: Secondary | ICD-10-CM | POA: Diagnosis not present

## 2015-11-22 DIAGNOSIS — Z87891 Personal history of nicotine dependence: Secondary | ICD-10-CM | POA: Diagnosis not present

## 2015-11-22 DIAGNOSIS — Z5111 Encounter for antineoplastic chemotherapy: Secondary | ICD-10-CM | POA: Insufficient documentation

## 2015-11-22 DIAGNOSIS — R351 Nocturia: Secondary | ICD-10-CM | POA: Insufficient documentation

## 2015-11-22 DIAGNOSIS — K219 Gastro-esophageal reflux disease without esophagitis: Secondary | ICD-10-CM | POA: Diagnosis not present

## 2015-11-22 DIAGNOSIS — Z79899 Other long term (current) drug therapy: Secondary | ICD-10-CM

## 2015-11-22 DIAGNOSIS — I10 Essential (primary) hypertension: Secondary | ICD-10-CM | POA: Diagnosis not present

## 2015-11-22 LAB — CBC WITH DIFFERENTIAL/PLATELET
BASOS ABS: 0.1 10*3/uL (ref 0–0.1)
BASOS PCT: 1 %
Eosinophils Absolute: 0.3 10*3/uL (ref 0–0.7)
Eosinophils Relative: 6 %
HEMATOCRIT: 39.7 % — AB (ref 40.0–52.0)
HEMOGLOBIN: 13.2 g/dL (ref 13.0–18.0)
LYMPHS PCT: 27 %
Lymphs Abs: 1.6 10*3/uL (ref 1.0–3.6)
MCH: 29 pg (ref 26.0–34.0)
MCHC: 33.3 g/dL (ref 32.0–36.0)
MCV: 87 fL (ref 80.0–100.0)
MONO ABS: 0.6 10*3/uL (ref 0.2–1.0)
Monocytes Relative: 11 %
NEUTROS ABS: 3.1 10*3/uL (ref 1.4–6.5)
NEUTROS PCT: 55 %
Platelets: 196 10*3/uL (ref 150–440)
RBC: 4.56 MIL/uL (ref 4.40–5.90)
RDW: 15.5 % — AB (ref 11.5–14.5)
WBC: 5.7 10*3/uL (ref 3.8–10.6)

## 2015-11-22 LAB — COMPREHENSIVE METABOLIC PANEL
ALBUMIN: 4.2 g/dL (ref 3.5–5.0)
ALK PHOS: 92 U/L (ref 38–126)
ALT: 18 U/L (ref 17–63)
AST: 19 U/L (ref 15–41)
Anion gap: 8 (ref 5–15)
BILIRUBIN TOTAL: 0.6 mg/dL (ref 0.3–1.2)
BUN: 25 mg/dL — AB (ref 6–20)
CALCIUM: 9.5 mg/dL (ref 8.9–10.3)
CO2: 25 mmol/L (ref 22–32)
CREATININE: 1.03 mg/dL (ref 0.61–1.24)
Chloride: 105 mmol/L (ref 101–111)
GFR calc Af Amer: >60 mL/min (ref >60)
GLUCOSE: 153 mg/dL — AB (ref 65–99)
Potassium: 3.9 mmol/L (ref 3.5–5.1)
Sodium: 138 mmol/L (ref 135–145)
TOTAL PROTEIN: 7.4 g/dL (ref 6.5–8.1)

## 2015-11-22 MED ORDER — SODIUM CHLORIDE 0.9 % IV SOLN
10.0000 mg | Freq: Once | INTRAVENOUS | Status: AC
  Administered 2015-11-22: 10 mg via INTRAVENOUS
  Filled 2015-11-22: qty 1

## 2015-11-22 MED ORDER — PALONOSETRON HCL INJECTION 0.25 MG/5ML
0.2500 mg | Freq: Once | INTRAVENOUS | Status: AC
  Administered 2015-11-22: 0.25 mg via INTRAVENOUS
  Filled 2015-11-22: qty 5

## 2015-11-22 MED ORDER — PREDNISONE 20 MG PO TABS: 100.0000 mg | ORAL_TABLET | Freq: Every day | ORAL | 5 refills | Status: DC

## 2015-11-22 MED ORDER — SODIUM CHLORIDE 0.9 % IV SOLN
375.0000 mg/m2 | Freq: Once | INTRAVENOUS | Status: AC
  Administered 2015-11-22: 800 mg via INTRAVENOUS
  Filled 2015-11-22: qty 80

## 2015-11-22 MED ORDER — ACETAMINOPHEN 325 MG PO TABS
650.0000 mg | ORAL_TABLET | Freq: Once | ORAL | Status: AC
  Administered 2015-11-22: 650 mg via ORAL
  Filled 2015-11-22: qty 2

## 2015-11-22 MED ORDER — DIPHENHYDRAMINE HCL 25 MG PO CAPS
25.0000 mg | ORAL_CAPSULE | Freq: Once | ORAL | Status: AC
  Administered 2015-11-22: 25 mg via ORAL
  Filled 2015-11-22: qty 1

## 2015-11-22 MED ORDER — VINCRISTINE SULFATE CHEMO INJECTION 1 MG/ML
2.0000 mg | Freq: Once | INTRAVENOUS | Status: AC
  Administered 2015-11-22: 2 mg via INTRAVENOUS
  Filled 2015-11-22: qty 2

## 2015-11-22 MED ORDER — SODIUM CHLORIDE 0.9 % IV SOLN
800.0000 mg/m2 | Freq: Once | INTRAVENOUS | Status: AC
  Administered 2015-11-22: 1800 mg via INTRAVENOUS
  Filled 2015-11-22: qty 90

## 2015-11-22 MED ORDER — SODIUM CHLORIDE 0.9 % IV SOLN
Freq: Once | INTRAVENOUS | Status: AC
  Administered 2015-11-22: 10:00:00 via INTRAVENOUS
  Filled 2015-11-22: qty 1000

## 2015-11-22 NOTE — Patient Instructions (Signed)
Please notify Dr. Grayland Ormond should you develop any side effects at home.  Please call the office if you have a fever over 100.5.

## 2015-11-22 NOTE — Progress Notes (Signed)
Feeling well. Offers no complaints. 

## 2015-11-29 ENCOUNTER — Inpatient Hospital Stay: Payer: PPO

## 2015-11-29 DIAGNOSIS — Z5111 Encounter for antineoplastic chemotherapy: Secondary | ICD-10-CM | POA: Diagnosis not present

## 2015-11-29 DIAGNOSIS — C8339 Diffuse large B-cell lymphoma, extranodal and solid organ sites: Secondary | ICD-10-CM

## 2015-11-29 LAB — CBC WITH DIFFERENTIAL/PLATELET
BASOS PCT: 1 %
Basophils Absolute: 0 10*3/uL (ref 0–0.1)
EOS ABS: 0.3 10*3/uL (ref 0–0.7)
EOS PCT: 8 %
HCT: 37.5 % — ABNORMAL LOW (ref 40.0–52.0)
HEMOGLOBIN: 12.5 g/dL — AB (ref 13.0–18.0)
Lymphocytes Relative: 27 %
Lymphs Abs: 1 10*3/uL (ref 1.0–3.6)
MCH: 29 pg (ref 26.0–34.0)
MCHC: 33.3 g/dL (ref 32.0–36.0)
MCV: 87.1 fL (ref 80.0–100.0)
Monocytes Absolute: 0.3 10*3/uL (ref 0.2–1.0)
Monocytes Relative: 9 %
NEUTROS PCT: 55 %
Neutro Abs: 2.2 10*3/uL (ref 1.4–6.5)
PLATELETS: 219 10*3/uL (ref 150–440)
RBC: 4.31 MIL/uL — AB (ref 4.40–5.90)
RDW: 14.8 % — ABNORMAL HIGH (ref 11.5–14.5)
WBC: 3.9 10*3/uL (ref 3.8–10.6)

## 2015-11-29 LAB — COMPREHENSIVE METABOLIC PANEL
ALBUMIN: 3.9 g/dL (ref 3.5–5.0)
ALK PHOS: 81 U/L (ref 38–126)
ALT: 21 U/L (ref 17–63)
ANION GAP: 6 (ref 5–15)
AST: 15 U/L (ref 15–41)
BUN: 20 mg/dL (ref 6–20)
CALCIUM: 8.9 mg/dL (ref 8.9–10.3)
CHLORIDE: 101 mmol/L (ref 101–111)
CO2: 28 mmol/L (ref 22–32)
Creatinine, Ser: 0.96 mg/dL (ref 0.61–1.24)
GFR calc non Af Amer: >60 mL/min (ref >60)
GLUCOSE: 111 mg/dL — AB (ref 65–99)
Potassium: 4 mmol/L (ref 3.5–5.1)
SODIUM: 135 mmol/L (ref 135–145)
Total Bilirubin: 0.8 mg/dL (ref 0.3–1.2)
Total Protein: 6.9 g/dL (ref 6.5–8.1)

## 2015-12-12 NOTE — Progress Notes (Signed)
.  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 He tolerated his first treatment well without significant side effects. He continues to feel well and is asymptomatic. Patient denies any weight loss, fevers, night sweats, or other B symptoms. He denies any abdominal pain. He has no nausea, vomiting, constipation, or diarrhea.  He denies and chest pain or shortness of breath.  He has no neurological complaints. He has no urinary complaints. Patient offers no specific complaints today.  REVIEW OF SYSTEMS:   Review of Systems  Constitutional: Negative for fever, malaise/fatigue and weight loss.  Respiratory: Negative.  Negative for cough and hemoptysis.   Cardiovascular: Negative.  Negative for chest pain.  Gastrointestinal: Negative.  Negative for abdominal pain, blood in stool, melena, nausea and vomiting.  Musculoskeletal: Negative.   Neurological: Negative.  Negative for weakness.  Endo/Heme/Allergies: Does not bruise/bleed easily.  Psychiatric/Behavioral: The patient is not nervous/anxious.     As per HPI. Otherwise, a complete review of systems is negative.  PAST MEDICAL HISTORY: Past Medical History:  Diagnosis Date  . Acid reflux 06/03/2015  . Adenomatous colon polyp 08/12/2014   Overview:  On 05/25/2011 colonoscopy; repeat 05/2016 (Dr. Gustavo Lah)   . BPH (benign prostatic hyperplasia)   . Cancer ([place])    LYMPHOMA  . Dribbling   . ED (erectile dysfunction)   . Essential (primary) hypertension 06/03/2015  . GERD (gastroesophageal reflux disease)   . Glaucoma 08/07/2013  . Hesitancy     . HTN (hypertension)   . Hypertension   . Hypogonadism in male   . Nocturia   . Obese   . Urgency-frequency syndrome     PAST SURGICAL HISTORY: Past Surgical History:  Procedure Laterality Date  . BOWEL RESECTION  09/18/2015   Procedure: SMALL BOWEL RESECTION;  Surgeon: Clayburn Pert, MD;  Location: [facility] ORS;  Service: General;;  with anastomosis  . BOWEL RESECTION    . HERNIA REPAIR    . LAPAROSCOPY  09/18/2015   Procedure: LAPAROSCOPY DIAGNOSTIC;  Surgeon: Clayburn Pert, MD;  Location: [facility] ORS;  Service: General;;  . LAPAROTOMY N/A 09/18/2015   Procedure: EXPLORATORY LAPAROTOMY;  Surgeon: Clayburn Pert, MD;  Location: [facility] ORS;  Service: General;  Laterality: N/A;    FAMILY HISTORY: Family History  Problem Relation Age of Onset  . Prostate cancer Father   . Cancer Father   . Cancer      family members in general  . Cancer Sister   . Clotting disorder Sister   . Cancer Brother   . Bladder Cancer Neg Hx   . Kidney cancer Neg Hx        ADVANCED DIRECTIVES:    HEALTH MAINTENANCE: Social History  Substance Use Topics  . Smoking status: Former Smoker    Types: Cigars    Quit date: 04/10/2015  . Smokeless tobacco: Never Used     Comment: occasional cigar  . Alcohol use 0.0 oz/week     Comment: 1-2 drinks of Liquor Daily.     Colonoscopy:  PAP:  Bone density:  Lipid panel:  No Known Allergies  Current Outpatient Prescriptions  Medication Sig Dispense Refill  .  chlorhexidine (PERIDEX) 0.12 % solution 1 mL by Mouth Rinse route 1 day or 1 dose.    . finasteride (PROSCAR) 5 MG tablet Take 1 tablet (5 mg total) by mouth daily. 90 tablet 3  . latanoprost (XALATAN) 0.005 % ophthalmic solution Place 1 drop into both eyes every morning.     Marland Kitchen lisinopril-hydrochlorothiazide (PRINZIDE,ZESTORETIC) 10-12.5 MG tablet Take 1 tablet by mouth daily. 30 tablet 0  . Omega-3 Fatty Acids (FISH OIL) 1200 MG CAPS Take 1,200-2,400 mg by mouth See admin instructions. Take 2  capsules (2434m) by mouth every morning and 1 capsule by mouth in the evening.    . ondansetron (ZOFRAN) 8 MG tablet Take 1 tablet (8 mg total) by mouth 2 (two) times daily as needed for refractory nausea / vomiting. Start on day 3 after chemotherapy. 30 tablet 1  . predniSONE (DELTASONE) 20 MG tablet Take 5 tablets (100 mg total) by mouth daily. Take on days 1-5 of each chemotherapy cycle. 25 tablet 5  . prochlorperazine (COMPAZINE) 10 MG tablet Take 1 tablet (10 mg total) by mouth every 6 (six) hours as needed (Nausea or vomiting). 30 tablet 1  . shark liver oil-cocoa butter (PREPARATION H) 0.25-3-85.5 % suppository Place 1 suppository rectally daily as needed for hemorrhoids.    . timolol (TIMOPTIC) 0.5 % ophthalmic solution Apply 1 drop to eye every morning.      No current facility-administered medications for this visit.     OBJECTIVE: Vitals:   12/13/15 0854  BP: 126/76  Pulse: 69  Resp: 18  Temp: (!) 96.8 F (36 C)     Body mass index is 33.8 kg/m.    ECOG FS:0 - Asymptomatic  General: Well-developed, well-nourished, no acute distress. Eyes: Pink conjunctiva, anicteric sclera. Lungs: Clear to auscultation bilaterally. Heart: Regular rate and rhythm. No rubs, murmurs, or gallops. Abdomen: Soft, nontender, nondistended. No organomegaly noted, normoactive bowel sounds. Musculoskeletal: No edema, cyanosis, or clubbing. Neuro: Alert, answering all questions appropriately. Cranial nerves grossly intact. Skin: No rashes or petechiae noted. Psych: Normal affect. Lymphatics: No cervical, calvicular, axillary or inguinal LAD.   LAB RESULTS:  Lab Results  Component Value Date   NA 139 12/13/2015   K 4.9 12/13/2015   CL 104 12/13/2015   CO2 28 12/13/2015   GLUCOSE 129 (H) 12/13/2015   BUN 21 (H) 12/13/2015   CREATININE 1.14 12/13/2015   CALCIUM 9.6 12/13/2015   PROT 7.4 12/13/2015   ALBUMIN 3.8 12/13/2015   AST 19 12/13/2015   ALT 21 12/13/2015   ALKPHOS 82 12/13/2015    BILITOT 0.5 12/13/2015   GFRNONAA >60 12/13/2015   GFRAA >60 12/13/2015    Lab Results  Component Value Date   WBC 5.9 12/13/2015   NEUTROABS 3.8 12/13/2015   HGB 12.9 (L) 12/13/2015   HCT 38.4 (L) 12/13/2015   MCV 85.9 12/13/2015   PLT 218 12/13/2015     STUDIES: No results found.  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 Because of the extensive extranodal involvement of the terminal illium, this is considered stage IV disease. PET scan results reviewed independently with no obvious disease elsewhere in patient's body. Bone marrow biopsy was negative. After lengthy discussion with the patient, he has agreed to pursue adjuvant chemotherapy using Rituxan plus CVP. Plan to give a total of 6 treatments  every 3 weeks. Patient has declined port placement. Proceed with cycle 2 of 6 of Rituxan plus CVP. Return to clinic in 3 weeks for consideration of cycle 2.   Approximately 30 minutes was spent in discussion of which greater than 50% was consultation.  Patient expressed understanding and was in agreement with this plan. He also understands that He can call clinic at any time with any questions, concerns, or complaints.   Diffuse large B cell lymphoma ([place])   Staging form: Lymphoid Neoplasms, AJCC 6th Edition   - Clinical stage from 10/09/2015: Stage IV - Signed by Lloyd Huger, MD on 10/09/2015  Lloyd Huger, MD   12/17/2015 9:21 AM

## 2015-12-13 ENCOUNTER — Inpatient Hospital Stay (HOSPITAL_BASED_OUTPATIENT_CLINIC_OR_DEPARTMENT_OTHER): Payer: PPO | Admitting: Oncology

## 2015-12-13 ENCOUNTER — Inpatient Hospital Stay: Payer: PPO

## 2015-12-13 VITALS — BP 126/76 | HR 69 | Temp 96.8°F | Resp 18 | Wt 235.6 lb

## 2015-12-13 VITALS — BP 149/76 | HR 68 | Resp 18

## 2015-12-13 DIAGNOSIS — Z79899 Other long term (current) drug therapy: Secondary | ICD-10-CM | POA: Diagnosis not present

## 2015-12-13 DIAGNOSIS — C83398 Diffuse large b-cell lymphoma of other extranodal and solid organ sites: Secondary | ICD-10-CM

## 2015-12-13 DIAGNOSIS — C8339 Diffuse large B-cell lymphoma, extranodal and solid organ sites: Secondary | ICD-10-CM

## 2015-12-13 DIAGNOSIS — Z5111 Encounter for antineoplastic chemotherapy: Secondary | ICD-10-CM | POA: Diagnosis not present

## 2015-12-13 LAB — CBC WITH DIFFERENTIAL/PLATELET
BASOS ABS: 0 10*3/uL (ref 0–0.1)
BASOS PCT: 1 %
EOS ABS: 0.2 10*3/uL (ref 0–0.7)
EOS PCT: 4 %
HCT: 38.4 % — ABNORMAL LOW (ref 40.0–52.0)
Hemoglobin: 12.9 g/dL — ABNORMAL LOW (ref 13.0–18.0)
LYMPHS PCT: 20 %
Lymphs Abs: 1.2 10*3/uL (ref 1.0–3.6)
MCH: 28.8 pg (ref 26.0–34.0)
MCHC: 33.6 g/dL (ref 32.0–36.0)
MCV: 85.9 fL (ref 80.0–100.0)
MONO ABS: 0.7 10*3/uL (ref 0.2–1.0)
Monocytes Relative: 12 %
Neutro Abs: 3.8 10*3/uL (ref 1.4–6.5)
Neutrophils Relative %: 63 %
PLATELETS: 218 10*3/uL (ref 150–440)
RBC: 4.47 MIL/uL (ref 4.40–5.90)
RDW: 14.5 % (ref 11.5–14.5)
WBC: 5.9 10*3/uL (ref 3.8–10.6)

## 2015-12-13 LAB — COMPREHENSIVE METABOLIC PANEL
ALT: 21 U/L (ref 17–63)
AST: 19 U/L (ref 15–41)
Albumin: 3.8 g/dL (ref 3.5–5.0)
Alkaline Phosphatase: 82 U/L (ref 38–126)
Anion gap: 7 (ref 5–15)
BUN: 21 mg/dL — AB (ref 6–20)
CHLORIDE: 104 mmol/L (ref 101–111)
CO2: 28 mmol/L (ref 22–32)
Calcium: 9.6 mg/dL (ref 8.9–10.3)
Creatinine, Ser: 1.14 mg/dL (ref 0.61–1.24)
GFR calc Af Amer: >60 mL/min (ref >60)
Glucose, Bld: 129 mg/dL — ABNORMAL HIGH (ref 65–99)
POTASSIUM: 4.9 mmol/L (ref 3.5–5.1)
SODIUM: 139 mmol/L (ref 135–145)
Total Bilirubin: 0.5 mg/dL (ref 0.3–1.2)
Total Protein: 7.4 g/dL (ref 6.5–8.1)

## 2015-12-13 MED ORDER — SODIUM CHLORIDE 0.9 % IV SOLN
800.0000 mg | Freq: Once | INTRAVENOUS | Status: AC
  Administered 2015-12-13: 800 mg via INTRAVENOUS
  Filled 2015-12-13: qty 80

## 2015-12-13 MED ORDER — VINCRISTINE SULFATE CHEMO INJECTION 1 MG/ML
2.0000 mg | Freq: Once | INTRAVENOUS | Status: AC
  Administered 2015-12-13: 2 mg via INTRAVENOUS
  Filled 2015-12-13: qty 2

## 2015-12-13 MED ORDER — SODIUM CHLORIDE 0.9 % IV SOLN
Freq: Once | INTRAVENOUS | Status: AC
  Administered 2015-12-13: 09:00:00 via INTRAVENOUS
  Filled 2015-12-13: qty 1000

## 2015-12-13 MED ORDER — SODIUM CHLORIDE 0.9 % IV SOLN: 375.0000 mg/m2 | Freq: Once | INTRAVENOUS | Status: DC

## 2015-12-13 MED ORDER — SODIUM CHLORIDE 0.9 % IV SOLN
10.0000 mg | Freq: Once | INTRAVENOUS | Status: AC
  Administered 2015-12-13: 10 mg via INTRAVENOUS
  Filled 2015-12-13: qty 1

## 2015-12-13 MED ORDER — ACETAMINOPHEN 325 MG PO TABS
650.0000 mg | ORAL_TABLET | Freq: Once | ORAL | Status: AC
Start: 2015-12-13 — End: 2015-12-13
  Administered 2015-12-13: 650 mg via ORAL
  Filled 2015-12-13: qty 2

## 2015-12-13 MED ORDER — SODIUM CHLORIDE 0.9 % IV SOLN
800.0000 mg/m2 | Freq: Once | INTRAVENOUS | Status: AC
  Administered 2015-12-13: 1800 mg via INTRAVENOUS
  Filled 2015-12-13: qty 90

## 2015-12-13 MED ORDER — PALONOSETRON HCL INJECTION 0.25 MG/5ML
0.2500 mg | Freq: Once | INTRAVENOUS | Status: AC
  Administered 2015-12-13: 0.25 mg via INTRAVENOUS
  Filled 2015-12-13: qty 5

## 2015-12-13 MED ORDER — DIPHENHYDRAMINE HCL 25 MG PO CAPS
25.0000 mg | ORAL_CAPSULE | Freq: Once | ORAL | Status: AC
Start: 2015-12-13 — End: 2015-12-13
  Administered 2015-12-13: 25 mg via ORAL
  Filled 2015-12-13: qty 1

## 2015-12-13 NOTE — Progress Notes (Signed)
States is feeling slightly weak this morning but tolerated last treatment well.

## 2016-01-01 NOTE — Progress Notes (Signed)
.  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 He is tolerating his treatments well without significant side effects. He continues to feel well and is asymptomatic. Patient denies any weight loss, fevers, night sweats, or other B symptoms. He denies any abdominal pain. He has no nausea, vomiting, constipation, or diarrhea.  He denies and chest pain or shortness of breath.  He has no neurological complaints. He has no urinary complaints. Patient offers no specific complaints today.  REVIEW OF SYSTEMS:   Review of Systems  Constitutional: Negative for fever, malaise/fatigue and weight loss.  Respiratory: Negative.  Negative for cough and hemoptysis.   Cardiovascular: Negative.  Negative for chest pain.  Gastrointestinal: Negative.  Negative for abdominal pain, blood in stool, melena, nausea and vomiting.  Musculoskeletal: Negative.   Neurological: Negative.  Negative for weakness.  Endo/Heme/Allergies: Does not bruise/bleed easily.  Psychiatric/Behavioral: The patient is not nervous/anxious.     As per HPI. Otherwise, a complete review of systems is negative.  PAST MEDICAL HISTORY: Past Medical History:  Diagnosis Date  . Acid reflux 06/03/2015  . Adenomatous colon polyp 08/12/2014   Overview:  On 05/25/2011 colonoscopy; repeat 05/2016 (Dr. Gustavo Lah)   . BPH (benign prostatic hyperplasia)   . Cancer ([place])    LYMPHOMA  . Dribbling   . ED (erectile dysfunction)   . Essential (primary) hypertension 06/03/2015  . GERD (gastroesophageal reflux disease)   . Glaucoma 08/07/2013  . Hesitancy     . HTN (hypertension)   . Hypertension   . Hypogonadism in male   . Nocturia   . Obese   . Urgency-frequency syndrome     PAST SURGICAL HISTORY: Past Surgical History:  Procedure Laterality Date  . BOWEL RESECTION  09/18/2015   Procedure: SMALL BOWEL RESECTION;  Surgeon: Clayburn Pert, MD;  Location: [facility] ORS;  Service: General;;  with anastomosis  . BOWEL RESECTION    . HERNIA REPAIR    . LAPAROSCOPY  09/18/2015   Procedure: LAPAROSCOPY DIAGNOSTIC;  Surgeon: Clayburn Pert, MD;  Location: [facility] OR[facility];  Service: General;;  . LAPAROTOMY N/A 09/18/2015   Procedure: EXPLORATORY LAPAROTOMY;  Surgeon: Clayburn Pert, MD;  Location: [facility] ORS;  Service: General;  Laterality: N/A;    FAMILY HISTORY: Family History  Problem Relation Age of Onset  . Prostate cancer Father   . Cancer Father   . Cancer      family members in general  . Cancer Sister   . Clotting disorder Sister   . Cancer Brother   . Bladder Cancer Neg Hx   . Kidney cancer Neg Hx        ADVANCED DIRECTIVES:    HEALTH MAINTENANCE: Social History  Substance Use Topics  . Smoking status: Former Smoker    Types: Cigars    Quit date: 04/10/2015  . Smokeless tobacco: Never Used     Comment: occasional cigar  . Alcohol use 0.0 oz/week     Comment: 1-2 drinks of Liquor Daily.     Colonoscopy:  PAP:  Bone density:  Lipid panel:  No Known Allergies  Current Outpatient Prescriptions  Medication Sig Dispense Refill  .  aspirin EC 81 MG tablet Take 81 mg by mouth daily.    . chlorhexidine (PERIDEX) 0.12 % solution 1 mL by Mouth Rinse route 1 day or 1 dose.    . finasteride (PROSCAR) 5 MG tablet Take 1 tablet (5 mg total) by mouth daily. 90 tablet 3  . latanoprost (XALATAN) 0.005 % ophthalmic solution Place 1 drop into both eyes every morning.     Marland Kitchen lisinopril-hydrochlorothiazide (PRINZIDE,ZESTORETIC) 10-12.5 MG tablet Take 1 tablet by mouth daily. 30 tablet 0  . Omega-3 Fatty Acids (FISH OIL) 1200 MG CAPS Take  1,200-2,400 mg by mouth See admin instructions. Take 2 capsules (2446m) by mouth every morning and 1 capsule by mouth in the evening.    . ondansetron (ZOFRAN) 8 MG tablet Take 1 tablet (8 mg total) by mouth 2 (two) times daily as needed for refractory nausea / vomiting. Start on day 3 after chemotherapy. 30 tablet 1  . predniSONE (DELTASONE) 20 MG tablet Take 5 tablets (100 mg total) by mouth daily. Take on days 1-5 of each chemotherapy cycle. 25 tablet 5  . prochlorperazine (COMPAZINE) 10 MG tablet Take 1 tablet (10 mg total) by mouth every 6 (six) hours as needed (Nausea or vomiting). 30 tablet 1  . shark liver oil-cocoa butter (PREPARATION H) 0.25-3-85.5 % suppository Place 1 suppository rectally daily as needed for hemorrhoids.    . timolol (TIMOPTIC) 0.5 % ophthalmic solution Apply 1 drop to eye every morning.      No current facility-administered medications for this visit.     OBJECTIVE: Vitals:   01/03/16 0949  BP: 132/71  Pulse: 69  Resp: 20  Temp: 97.2 F (36.2 C)     Body mass index is 34.26 kg/m.    ECOG FS:0 - Asymptomatic  General: Well-developed, well-nourished, no acute distress. Eyes: Pink conjunctiva, anicteric sclera. Lungs: Clear to auscultation bilaterally. Heart: Regular rate and rhythm. No rubs, murmurs, or gallops. Abdomen: Soft, nontender, nondistended. No organomegaly noted, normoactive bowel sounds. Musculoskeletal: No edema, cyanosis, or clubbing. Neuro: Alert, answering all questions appropriately. Cranial nerves grossly intact. Skin: No rashes or petechiae noted. Psych: Normal affect. Lymphatics: No cervical, calvicular, axillary or inguinal LAD.   LAB RESULTS:  Lab Results  Component Value Date   NA 138 01/03/2016   K 4.3 01/03/2016   CL 104 01/03/2016   CO2 27 01/03/2016   GLUCOSE 135 (H) 01/03/2016   BUN 25 (H) 01/03/2016   CREATININE 1.08 01/03/2016   CALCIUM 9.3 01/03/2016   PROT 7.2 01/03/2016   ALBUMIN 4.0 01/03/2016   AST 20  01/03/2016   ALT 19 01/03/2016   ALKPHOS 73 01/03/2016   BILITOT 0.6 01/03/2016   GFRNONAA >60 01/03/2016   GFRAA >60 01/03/2016    Lab Results  Component Value Date   WBC 4.6 01/03/2016   NEUTROABS 2.5 01/03/2016   HGB 13.1 01/03/2016   HCT 39.0 (L) 01/03/2016   MCV 85.7 01/03/2016   PLT 183 01/03/2016     STUDIES: No results found.  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 Because of the extensive extranodal involvement of the terminal illium, this is considered stage IV disease. PET scan results reviewed independently with no obvious disease elsewhere in patient's body. Bone marrow biopsy was negative. After lengthy discussion with the patient, he has agreed to pursue adjuvant  chemotherapy using Rituxan plus CVP. Plan to give a total of 6 treatments every 3 weeks. Patient has declined port placement. Proceed with cycle 3 of 6 of Rituxan plus CVP. Return to clinic on January 28, 2016 for consideration of cycle 4 and then again on February 21, 2016 for further evaluation and consideration of cycle 5.    Approximately 30 minutes was spent in discussion of which greater than 50% was consultation.  Patient expressed understanding and was in agreement with this plan. He also understands that He can call clinic at any time with any questions, concerns, or complaints.   Diffuse large B cell lymphoma ([facility])   Staging form: Lymphoid Neoplasms, AJCC 6th Edition   - Clinical stage from 10/09/2015: Stage IV - Signed by Timothy J Finnegan, MD on 10/09/2015  Timothy J Finnegan, MD   01/08/2016 1:33 PM      

## 2016-01-03 ENCOUNTER — Inpatient Hospital Stay (HOSPITAL_BASED_OUTPATIENT_CLINIC_OR_DEPARTMENT_OTHER): Payer: PPO | Admitting: Oncology

## 2016-01-03 ENCOUNTER — Inpatient Hospital Stay: Payer: PPO

## 2016-01-03 ENCOUNTER — Inpatient Hospital Stay: Payer: PPO | Attending: Oncology

## 2016-01-03 VITALS — BP 130/69 | HR 60 | Resp 20

## 2016-01-03 VITALS — BP 132/71 | HR 69 | Temp 97.2°F | Resp 20 | Wt 238.8 lb

## 2016-01-03 DIAGNOSIS — Z87891 Personal history of nicotine dependence: Secondary | ICD-10-CM | POA: Insufficient documentation

## 2016-01-03 DIAGNOSIS — Z79899 Other long term (current) drug therapy: Secondary | ICD-10-CM | POA: Diagnosis not present

## 2016-01-03 DIAGNOSIS — H409 Unspecified glaucoma: Secondary | ICD-10-CM | POA: Diagnosis not present

## 2016-01-03 DIAGNOSIS — C8339 Diffuse large B-cell lymphoma, extranodal and solid organ sites: Secondary | ICD-10-CM

## 2016-01-03 DIAGNOSIS — K219 Gastro-esophageal reflux disease without esophagitis: Secondary | ICD-10-CM | POA: Diagnosis not present

## 2016-01-03 DIAGNOSIS — Z5111 Encounter for antineoplastic chemotherapy: Secondary | ICD-10-CM | POA: Diagnosis not present

## 2016-01-03 DIAGNOSIS — E669 Obesity, unspecified: Secondary | ICD-10-CM | POA: Diagnosis not present

## 2016-01-03 DIAGNOSIS — Z7982 Long term (current) use of aspirin: Secondary | ICD-10-CM | POA: Insufficient documentation

## 2016-01-03 DIAGNOSIS — I10 Essential (primary) hypertension: Secondary | ICD-10-CM | POA: Diagnosis not present

## 2016-01-03 DIAGNOSIS — N400 Enlarged prostate without lower urinary tract symptoms: Secondary | ICD-10-CM | POA: Diagnosis not present

## 2016-01-03 LAB — CBC WITH DIFFERENTIAL/PLATELET
BASOS ABS: 0.2 10*3/uL — AB (ref 0–0.1)
BASOS PCT: 3 %
EOS PCT: 7 %
Eosinophils Absolute: 0.3 10*3/uL (ref 0–0.7)
HEMATOCRIT: 39 % — AB (ref 40.0–52.0)
Hemoglobin: 13.1 g/dL (ref 13.0–18.0)
LYMPHS PCT: 18 %
Lymphs Abs: 0.8 10*3/uL — ABNORMAL LOW (ref 1.0–3.6)
MCH: 28.9 pg (ref 26.0–34.0)
MCHC: 33.7 g/dL (ref 32.0–36.0)
MCV: 85.7 fL (ref 80.0–100.0)
MONO ABS: 0.9 10*3/uL (ref 0.2–1.0)
Monocytes Relative: 19 %
NEUTROS ABS: 2.5 10*3/uL (ref 1.4–6.5)
Neutrophils Relative %: 53 %
PLATELETS: 183 10*3/uL (ref 150–440)
RBC: 4.55 MIL/uL (ref 4.40–5.90)
RDW: 15.5 % — AB (ref 11.5–14.5)
WBC: 4.6 10*3/uL (ref 3.8–10.6)

## 2016-01-03 LAB — COMPREHENSIVE METABOLIC PANEL
ALBUMIN: 4 g/dL (ref 3.5–5.0)
ALT: 19 U/L (ref 17–63)
AST: 20 U/L (ref 15–41)
Alkaline Phosphatase: 73 U/L (ref 38–126)
Anion gap: 7 (ref 5–15)
BUN: 25 mg/dL — AB (ref 6–20)
CHLORIDE: 104 mmol/L (ref 101–111)
CO2: 27 mmol/L (ref 22–32)
CREATININE: 1.08 mg/dL (ref 0.61–1.24)
Calcium: 9.3 mg/dL (ref 8.9–10.3)
GFR calc Af Amer: >60 mL/min (ref >60)
GFR calc non Af Amer: >60 mL/min (ref >60)
GLUCOSE: 135 mg/dL — AB (ref 65–99)
POTASSIUM: 4.3 mmol/L (ref 3.5–5.1)
Sodium: 138 mmol/L (ref 135–145)
Total Bilirubin: 0.6 mg/dL (ref 0.3–1.2)
Total Protein: 7.2 g/dL (ref 6.5–8.1)

## 2016-01-03 MED ORDER — VINCRISTINE SULFATE CHEMO INJECTION 1 MG/ML
2.0000 mg | Freq: Once | INTRAVENOUS | Status: AC
  Administered 2016-01-03: 2 mg via INTRAVENOUS
  Filled 2016-01-03: qty 2

## 2016-01-03 MED ORDER — PALONOSETRON HCL INJECTION 0.25 MG/5ML
0.2500 mg | Freq: Once | INTRAVENOUS | Status: AC
  Administered 2016-01-03: 0.25 mg via INTRAVENOUS
  Filled 2016-01-03: qty 5

## 2016-01-03 MED ORDER — DIPHENHYDRAMINE HCL 25 MG PO CAPS
25.0000 mg | ORAL_CAPSULE | Freq: Once | ORAL | Status: AC
  Administered 2016-01-03: 25 mg via ORAL
  Filled 2016-01-03: qty 1

## 2016-01-03 MED ORDER — SODIUM CHLORIDE 0.9 % IV SOLN
Freq: Once | INTRAVENOUS | Status: AC
  Administered 2016-01-03: 10:00:00 via INTRAVENOUS
  Filled 2016-01-03: qty 1000

## 2016-01-03 MED ORDER — SODIUM CHLORIDE 0.9 % IV SOLN: 375.0000 mg/m2 | Freq: Once | INTRAVENOUS | Status: DC

## 2016-01-03 MED ORDER — DEXAMETHASONE SODIUM PHOSPHATE 10 MG/ML IJ SOLN
10.0000 mg | Freq: Once | INTRAMUSCULAR | Status: AC
  Administered 2016-01-03: 10 mg via INTRAVENOUS
  Filled 2016-01-03: qty 1

## 2016-01-03 MED ORDER — SODIUM CHLORIDE 0.9 % IV SOLN
800.0000 mg/m2 | Freq: Once | INTRAVENOUS | Status: AC
  Administered 2016-01-03: 1800 mg via INTRAVENOUS
  Filled 2016-01-03: qty 90

## 2016-01-03 MED ORDER — SODIUM CHLORIDE 0.9 % IV SOLN: 10.0000 mg | Freq: Once | INTRAVENOUS | Status: DC

## 2016-01-03 MED ORDER — SODIUM CHLORIDE 0.9 % IV SOLN
800.0000 mg | Freq: Once | INTRAVENOUS | Status: AC
  Administered 2016-01-03: 800 mg via INTRAVENOUS
  Filled 2016-01-03: qty 80

## 2016-01-03 MED ORDER — ACETAMINOPHEN 325 MG PO TABS
650.0000 mg | ORAL_TABLET | Freq: Once | ORAL | Status: AC
  Administered 2016-01-03: 650 mg via ORAL
  Filled 2016-01-03: qty 2

## 2016-01-03 NOTE — Progress Notes (Signed)
Patient here today for ongoing follow up and treatment consideration regarding lymphoma. Patient has an upcoming appointment with eye doctor, would like to know if appointment should be rescheduled until after completion of treatment.

## 2016-01-28 ENCOUNTER — Inpatient Hospital Stay: Payer: PPO

## 2016-01-28 ENCOUNTER — Inpatient Hospital Stay: Payer: PPO | Attending: Oncology

## 2016-01-28 VITALS — BP 127/70 | HR 63 | Temp 96.9°F | Resp 20

## 2016-01-28 DIAGNOSIS — H409 Unspecified glaucoma: Secondary | ICD-10-CM | POA: Diagnosis not present

## 2016-01-28 DIAGNOSIS — K219 Gastro-esophageal reflux disease without esophagitis: Secondary | ICD-10-CM | POA: Insufficient documentation

## 2016-01-28 DIAGNOSIS — N400 Enlarged prostate without lower urinary tract symptoms: Secondary | ICD-10-CM | POA: Diagnosis not present

## 2016-01-28 DIAGNOSIS — Z7982 Long term (current) use of aspirin: Secondary | ICD-10-CM | POA: Diagnosis not present

## 2016-01-28 DIAGNOSIS — Z87891 Personal history of nicotine dependence: Secondary | ICD-10-CM | POA: Insufficient documentation

## 2016-01-28 DIAGNOSIS — C8339 Diffuse large B-cell lymphoma, extranodal and solid organ sites: Secondary | ICD-10-CM | POA: Insufficient documentation

## 2016-01-28 DIAGNOSIS — Z5111 Encounter for antineoplastic chemotherapy: Secondary | ICD-10-CM | POA: Diagnosis not present

## 2016-01-28 DIAGNOSIS — E669 Obesity, unspecified: Secondary | ICD-10-CM | POA: Diagnosis not present

## 2016-01-28 DIAGNOSIS — Z79899 Other long term (current) drug therapy: Secondary | ICD-10-CM | POA: Diagnosis not present

## 2016-01-28 DIAGNOSIS — I10 Essential (primary) hypertension: Secondary | ICD-10-CM | POA: Diagnosis not present

## 2016-01-28 LAB — COMPREHENSIVE METABOLIC PANEL
ALBUMIN: 4 g/dL (ref 3.5–5.0)
ALT: 19 U/L (ref 17–63)
ANION GAP: 6 (ref 5–15)
AST: 22 U/L (ref 15–41)
Alkaline Phosphatase: 69 U/L (ref 38–126)
BILIRUBIN TOTAL: 0.5 mg/dL (ref 0.3–1.2)
BUN: 22 mg/dL — ABNORMAL HIGH (ref 6–20)
CHLORIDE: 103 mmol/L (ref 101–111)
CO2: 27 mmol/L (ref 22–32)
Calcium: 9.3 mg/dL (ref 8.9–10.3)
Creatinine, Ser: 1.1 mg/dL (ref 0.61–1.24)
GFR calc Af Amer: >60 mL/min (ref >60)
Glucose, Bld: 122 mg/dL — ABNORMAL HIGH (ref 65–99)
POTASSIUM: 4.6 mmol/L (ref 3.5–5.1)
Sodium: 136 mmol/L (ref 135–145)
TOTAL PROTEIN: 7.5 g/dL (ref 6.5–8.1)

## 2016-01-28 LAB — CBC WITH DIFFERENTIAL/PLATELET
BASOS PCT: 3 %
Basophils Absolute: 0.2 10*3/uL — ABNORMAL HIGH (ref 0–0.1)
EOS PCT: 8 %
Eosinophils Absolute: 0.5 10*3/uL (ref 0–0.7)
HEMATOCRIT: 39.8 % — AB (ref 40.0–52.0)
Hemoglobin: 13.2 g/dL (ref 13.0–18.0)
Lymphocytes Relative: 14 %
Lymphs Abs: 1 10*3/uL (ref 1.0–3.6)
MCH: 28.6 pg (ref 26.0–34.0)
MCHC: 33.1 g/dL (ref 32.0–36.0)
MCV: 86.2 fL (ref 80.0–100.0)
MONO ABS: 1.2 10*3/uL — AB (ref 0.2–1.0)
MONOS PCT: 18 %
NEUTROS ABS: 3.9 10*3/uL (ref 1.4–6.5)
Neutrophils Relative %: 57 %
PLATELETS: 175 10*3/uL (ref 150–440)
RBC: 4.62 MIL/uL (ref 4.40–5.90)
RDW: 16 % — AB (ref 11.5–14.5)
WBC: 6.7 10*3/uL (ref 3.8–10.6)

## 2016-01-28 MED ORDER — DIPHENHYDRAMINE HCL 25 MG PO CAPS
25.0000 mg | ORAL_CAPSULE | Freq: Once | ORAL | Status: AC
  Administered 2016-01-28: 25 mg via ORAL
  Filled 2016-01-28: qty 1

## 2016-01-28 MED ORDER — DEXAMETHASONE SODIUM PHOSPHATE 10 MG/ML IJ SOLN
10.0000 mg | Freq: Once | INTRAMUSCULAR | Status: AC
  Administered 2016-01-28: 10 mg via INTRAVENOUS
  Filled 2016-01-28: qty 1

## 2016-01-28 MED ORDER — SODIUM CHLORIDE 0.9 % IV SOLN: 375.0000 mg/m2 | Freq: Once | INTRAVENOUS | Status: DC

## 2016-01-28 MED ORDER — SODIUM CHLORIDE 0.9 % IV SOLN: 10.0000 mg | Freq: Once | INTRAVENOUS | Status: DC

## 2016-01-28 MED ORDER — ACETAMINOPHEN 325 MG PO TABS
650.0000 mg | ORAL_TABLET | Freq: Once | ORAL | Status: AC
  Administered 2016-01-28: 650 mg via ORAL
  Filled 2016-01-28: qty 2

## 2016-01-28 MED ORDER — VINCRISTINE SULFATE CHEMO INJECTION 1 MG/ML
2.0000 mg | Freq: Once | INTRAVENOUS | Status: AC
  Administered 2016-01-28: 2 mg via INTRAVENOUS
  Filled 2016-01-28: qty 2

## 2016-01-28 MED ORDER — PALONOSETRON HCL INJECTION 0.25 MG/5ML
0.2500 mg | Freq: Once | INTRAVENOUS | Status: AC
  Administered 2016-01-28: 0.25 mg via INTRAVENOUS
  Filled 2016-01-28: qty 5

## 2016-01-28 MED ORDER — SODIUM CHLORIDE 0.9 % IV SOLN
800.0000 mg | Freq: Once | INTRAVENOUS | Status: AC
  Administered 2016-01-28: 800 mg via INTRAVENOUS
  Filled 2016-01-28: qty 80

## 2016-01-28 MED ORDER — SODIUM CHLORIDE 0.9 % IV SOLN
Freq: Once | INTRAVENOUS | Status: AC
  Administered 2016-01-28: 09:00:00 via INTRAVENOUS
  Filled 2016-01-28: qty 1000

## 2016-01-28 MED ORDER — SODIUM CHLORIDE 0.9 % IV SOLN
800.0000 mg/m2 | Freq: Once | INTRAVENOUS | Status: AC
  Administered 2016-01-28: 1800 mg via INTRAVENOUS
  Filled 2016-01-28: qty 90

## 2016-02-06 DIAGNOSIS — H401131 Primary open-angle glaucoma, bilateral, mild stage: Secondary | ICD-10-CM | POA: Diagnosis not present

## 2016-02-18 DIAGNOSIS — C8513 Unspecified B-cell lymphoma, intra-abdominal lymph nodes: Secondary | ICD-10-CM | POA: Diagnosis not present

## 2016-02-18 DIAGNOSIS — R351 Nocturia: Secondary | ICD-10-CM | POA: Diagnosis not present

## 2016-02-18 DIAGNOSIS — Z23 Encounter for immunization: Secondary | ICD-10-CM | POA: Diagnosis not present

## 2016-02-18 DIAGNOSIS — R7302 Impaired glucose tolerance (oral): Secondary | ICD-10-CM | POA: Diagnosis not present

## 2016-02-18 DIAGNOSIS — Z79899 Other long term (current) drug therapy: Secondary | ICD-10-CM | POA: Diagnosis not present

## 2016-02-18 DIAGNOSIS — I10 Essential (primary) hypertension: Secondary | ICD-10-CM | POA: Diagnosis not present

## 2016-02-18 DIAGNOSIS — R252 Cramp and spasm: Secondary | ICD-10-CM | POA: Diagnosis not present

## 2016-02-18 DIAGNOSIS — E782 Mixed hyperlipidemia: Secondary | ICD-10-CM | POA: Diagnosis not present

## 2016-02-18 DIAGNOSIS — N401 Enlarged prostate with lower urinary tract symptoms: Secondary | ICD-10-CM | POA: Diagnosis not present

## 2016-02-19 DIAGNOSIS — E559 Vitamin D deficiency, unspecified: Secondary | ICD-10-CM | POA: Insufficient documentation

## 2016-02-20 NOTE — Progress Notes (Signed)
.  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 He is tolerating his treatments well without significant side effects. He continues to feel well and is asymptomatic. Patient denies any weight loss, fevers, night sweats, or other B symptoms. He denies any abdominal pain. He has no nausea, vomiting, constipation, or diarrhea.  He denies and chest pain or shortness of breath.  He has no neurological complaints. He has no urinary complaints. Patient offers no specific complaints today.  REVIEW OF SYSTEMS:   Review of Systems  Constitutional: Negative for fever, malaise/fatigue and weight loss.  Respiratory: Negative.  Negative for cough and hemoptysis.   Cardiovascular: Negative.  Negative for chest pain.  Gastrointestinal: Negative.  Negative for abdominal pain, blood in stool, melena, nausea and vomiting.  Musculoskeletal: Negative.   Neurological: Negative.  Negative for weakness.  Endo/Heme/Allergies: Does not bruise/bleed easily.  Psychiatric/Behavioral: The patient is not nervous/anxious.     As per HPI. Otherwise, a complete review of systems is negative.  PAST MEDICAL HISTORY: Past Medical History:  Diagnosis Date  . Acid reflux 06/03/2015  . Adenomatous colon polyp 08/12/2014   Overview:  On 05/25/2011 colonoscopy; repeat 05/2016 (Dr. Gustavo Lah)   . BPH (benign prostatic hyperplasia)   . Cancer ([place])    LYMPHOMA  . Dribbling   . ED (erectile dysfunction)   . Essential (primary) hypertension 06/03/2015  . GERD (gastroesophageal reflux disease)   . Glaucoma 08/07/2013  . Hesitancy     . HTN (hypertension)   . Hypertension   . Hypogonadism in male   . Nocturia   . Obese   . Urgency-frequency syndrome     PAST SURGICAL HISTORY: Past Surgical History:  Procedure Laterality Date  . BOWEL RESECTION  09/18/2015   Procedure: SMALL BOWEL RESECTION;  Surgeon: Clayburn Pert, MD;  Location: [facility] ORS;  Service: General;;  with anastomosis  . BOWEL RESECTION    . HERNIA REPAIR    . LAPAROSCOPY  09/18/2015   Procedure: LAPAROSCOPY DIAGNOSTIC;  Surgeon: Clayburn Pert, MD;  Location: [facility] ORS;  Service: General;;  . LAPAROTOMY N/A 09/18/2015   Procedure: EXPLORATORY LAPAROTOMY;  Surgeon: Clayburn Pert, MD;  Location: [facility] ORS;  Service: General;  Laterality: N/A;    FAMILY HISTORY: Family History  Problem Relation Age of Onset  . Prostate cancer Father   . Cancer Father   . Cancer      family members in general  . Cancer Sister   . Clotting disorder Sister   . Cancer Brother   . Bladder Cancer Neg Hx   . Kidney cancer Neg Hx        ADVANCED DIRECTIVES:    HEALTH MAINTENANCE: Social History  Substance Use Topics  . Smoking status: Former Smoker    Types: Cigars    Quit date: 04/10/2015  . Smokeless tobacco: Never Used     Comment: occasional cigar  . Alcohol use 0.0 oz/week     Comment: 1-2 drinks of Liquor Daily.     Colonoscopy:  PAP:  Bone density:  Lipid panel:  No Known Allergies  Current Outpatient Prescriptions  Medication Sig Dispense Refill  .  aspirin EC 81 MG tablet Take 81 mg by mouth daily.    . chlorhexidine (PERIDEX) 0.12 % solution 1 mL by Mouth Rinse route 1 day or 1 dose.    . finasteride (PROSCAR) 5 MG tablet Take 1 tablet (5 mg total) by mouth daily. 90 tablet 3  . latanoprost (XALATAN) 0.005 % ophthalmic solution Place 1 drop into both eyes every morning.     Marland Kitchen lisinopril-hydrochlorothiazide (PRINZIDE,ZESTORETIC) 10-12.5 MG tablet Take 1 tablet by mouth daily. 30 tablet 0  . Omega-3 Fatty Acids (FISH OIL) 1200 MG CAPS Take  1,200-2,400 mg by mouth See admin instructions. Take 2 capsules (2449m) by mouth every morning and 1 capsule by mouth in the evening.    . ondansetron (ZOFRAN) 8 MG tablet Take 1 tablet (8 mg total) by mouth 2 (two) times daily as needed for refractory nausea / vomiting. Start on day 3 after chemotherapy. 30 tablet 1  . predniSONE (DELTASONE) 20 MG tablet Take 5 tablets (100 mg total) by mouth daily. Take on days 1-5 of each chemotherapy cycle. 25 tablet 5  . prochlorperazine (COMPAZINE) 10 MG tablet Take 1 tablet (10 mg total) by mouth every 6 (six) hours as needed (Nausea or vomiting). 30 tablet 1  . shark liver oil-cocoa butter (PREPARATION H) 0.25-3-85.5 % suppository Place 1 suppository rectally daily as needed for hemorrhoids.    . timolol (TIMOPTIC) 0.5 % ophthalmic solution Apply 1 drop to eye every morning.      No current facility-administered medications for this visit.     OBJECTIVE: Vitals:   02/21/16 0857  BP: 133/73  Pulse: 77  Resp: 18  Temp: (!) 96.5 F (35.8 C)     Body mass index is 35.71 kg/m.    ECOG FS:0 - Asymptomatic  General: Well-developed, well-nourished, no acute distress. Eyes: Pink conjunctiva, anicteric sclera. Lungs: Clear to auscultation bilaterally. Heart: Regular rate and rhythm. No rubs, murmurs, or gallops. Abdomen: Soft, nontender, nondistended. No organomegaly noted, normoactive bowel sounds. Musculoskeletal: No edema, cyanosis, or clubbing. Neuro: Alert, answering all questions appropriately. Cranial nerves grossly intact. Skin: No rashes or petechiae noted. Psych: Normal affect. Lymphatics: No cervical, calvicular, axillary or inguinal LAD.   LAB RESULTS:  Lab Results  Component Value Date   NA 138 02/21/2016   K 4.6 02/21/2016   CL 103 02/21/2016   CO2 27 02/21/2016   GLUCOSE 160 (H) 02/21/2016   BUN 18 02/21/2016   CREATININE 1.15 02/21/2016   CALCIUM 9.2 02/21/2016   PROT 7.0 02/21/2016   ALBUMIN 3.9 02/21/2016   AST 24  02/21/2016   ALT 20 02/21/2016   ALKPHOS 71 02/21/2016   BILITOT 0.6 02/21/2016   GFRNONAA >60 02/21/2016   GFRAA >60 02/21/2016    Lab Results  Component Value Date   WBC 4.8 02/21/2016   NEUTROABS 2.5 02/21/2016   HGB 12.6 (L) 02/21/2016   HCT 37.4 (L) 02/21/2016   MCV 88.0 02/21/2016   PLT 156 02/21/2016     STUDIES: No results found.  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 Because of the extensive extranodal involvement of the terminal illium, this is considered stage IV disease. PET scan results reviewed independently with no obvious disease elsewhere in patient's body. Bone marrow biopsy was negative. Plan to give a total of 6 treatments every 3 weeks.  Patient has declined port placement. Proceed with cycle 5 of 6 of Rituxan plus CVP. Return to clinic in 3 weeks for further evaluation and consideration of cycle 6. Plan to re-image approximately 3 months after completion of treatment.  He will also likely require a repeat colonoscopy.   Approximately 30 minutes was spent in discussion of which greater than 50% was consultation.  Patient expressed understanding and was in agreement with this plan. He also understands that He can call clinic at any time with any questions, concerns, or complaints.   Diffuse large B cell lymphoma ([place])   Staging form: Lymphoid Neoplasms, AJCC 6th Edition   - Clinical stage from 10/09/2015: Stage IV - Signed by Lloyd Huger, MD on 10/09/2015  Lloyd Huger, MD   02/24/2016 11:08 PM

## 2016-02-21 ENCOUNTER — Ambulatory Visit: Payer: PPO

## 2016-02-21 ENCOUNTER — Inpatient Hospital Stay (HOSPITAL_BASED_OUTPATIENT_CLINIC_OR_DEPARTMENT_OTHER): Payer: PPO | Admitting: Oncology

## 2016-02-21 ENCOUNTER — Inpatient Hospital Stay: Payer: PPO | Attending: Oncology

## 2016-02-21 VITALS — BP 142/62 | HR 66 | Temp 97.6°F | Resp 18

## 2016-02-21 VITALS — BP 133/73 | HR 77 | Temp 96.5°F | Resp 18 | Wt 248.9 lb

## 2016-02-21 DIAGNOSIS — I10 Essential (primary) hypertension: Secondary | ICD-10-CM | POA: Insufficient documentation

## 2016-02-21 DIAGNOSIS — Z79899 Other long term (current) drug therapy: Secondary | ICD-10-CM

## 2016-02-21 DIAGNOSIS — Z87891 Personal history of nicotine dependence: Secondary | ICD-10-CM | POA: Insufficient documentation

## 2016-02-21 DIAGNOSIS — K219 Gastro-esophageal reflux disease without esophagitis: Secondary | ICD-10-CM | POA: Diagnosis not present

## 2016-02-21 DIAGNOSIS — C8339 Diffuse large B-cell lymphoma, extranodal and solid organ sites: Secondary | ICD-10-CM

## 2016-02-21 DIAGNOSIS — H409 Unspecified glaucoma: Secondary | ICD-10-CM | POA: Diagnosis not present

## 2016-02-21 DIAGNOSIS — Z5111 Encounter for antineoplastic chemotherapy: Secondary | ICD-10-CM | POA: Diagnosis not present

## 2016-02-21 DIAGNOSIS — N400 Enlarged prostate without lower urinary tract symptoms: Secondary | ICD-10-CM | POA: Diagnosis not present

## 2016-02-21 DIAGNOSIS — E669 Obesity, unspecified: Secondary | ICD-10-CM | POA: Diagnosis not present

## 2016-02-21 DIAGNOSIS — Z7982 Long term (current) use of aspirin: Secondary | ICD-10-CM | POA: Diagnosis not present

## 2016-02-21 DIAGNOSIS — C83398 Diffuse large b-cell lymphoma of other extranodal and solid organ sites: Secondary | ICD-10-CM

## 2016-02-21 LAB — CBC WITH DIFFERENTIAL/PLATELET
Basophils Absolute: 0.1 10*3/uL (ref 0–0.1)
Basophils Relative: 2 %
Eosinophils Absolute: 0.4 10*3/uL (ref 0–0.7)
Eosinophils Relative: 9 %
HEMATOCRIT: 37.4 % — AB (ref 40.0–52.0)
HEMOGLOBIN: 12.6 g/dL — AB (ref 13.0–18.0)
LYMPHS ABS: 0.8 10*3/uL — AB (ref 1.0–3.6)
Lymphocytes Relative: 16 %
MCH: 29.5 pg (ref 26.0–34.0)
MCHC: 33.6 g/dL (ref 32.0–36.0)
MCV: 88 fL (ref 80.0–100.0)
MONO ABS: 0.9 10*3/uL (ref 0.2–1.0)
MONOS PCT: 19 %
NEUTROS ABS: 2.5 10*3/uL (ref 1.4–6.5)
NEUTROS PCT: 54 %
Platelets: 156 10*3/uL (ref 150–440)
RBC: 4.25 MIL/uL — ABNORMAL LOW (ref 4.40–5.90)
RDW: 16.1 % — AB (ref 11.5–14.5)
WBC: 4.8 10*3/uL (ref 3.8–10.6)

## 2016-02-21 LAB — COMPREHENSIVE METABOLIC PANEL
ALK PHOS: 71 U/L (ref 38–126)
ALT: 20 U/L (ref 17–63)
ANION GAP: 8 (ref 5–15)
AST: 24 U/L (ref 15–41)
Albumin: 3.9 g/dL (ref 3.5–5.0)
BILIRUBIN TOTAL: 0.6 mg/dL (ref 0.3–1.2)
BUN: 18 mg/dL (ref 6–20)
CALCIUM: 9.2 mg/dL (ref 8.9–10.3)
CO2: 27 mmol/L (ref 22–32)
Chloride: 103 mmol/L (ref 101–111)
Creatinine, Ser: 1.15 mg/dL (ref 0.61–1.24)
GLUCOSE: 160 mg/dL — AB (ref 65–99)
POTASSIUM: 4.6 mmol/L (ref 3.5–5.1)
Sodium: 138 mmol/L (ref 135–145)
TOTAL PROTEIN: 7 g/dL (ref 6.5–8.1)

## 2016-02-21 MED ORDER — SODIUM CHLORIDE 0.9 % IV SOLN
Freq: Once | INTRAVENOUS | Status: AC
  Administered 2016-02-21: 09:00:00 via INTRAVENOUS
  Filled 2016-02-21: qty 1000

## 2016-02-21 MED ORDER — RITUXIMAB CHEMO INJECTION 500 MG/50ML
375.0000 mg/m2 | Freq: Once | INTRAVENOUS | Status: AC
  Administered 2016-02-21: 800 mg via INTRAVENOUS
  Filled 2016-02-21: qty 80

## 2016-02-21 MED ORDER — DEXAMETHASONE SODIUM PHOSPHATE 10 MG/ML IJ SOLN
10.0000 mg | Freq: Once | INTRAMUSCULAR | Status: AC
  Administered 2016-02-21: 10 mg via INTRAVENOUS
  Filled 2016-02-21: qty 1

## 2016-02-21 MED ORDER — ACETAMINOPHEN 325 MG PO TABS
650.0000 mg | ORAL_TABLET | Freq: Once | ORAL | Status: AC
  Administered 2016-02-21: 650 mg via ORAL
  Filled 2016-02-21: qty 2

## 2016-02-21 MED ORDER — SODIUM CHLORIDE 0.9 % IV SOLN: 375.0000 mg/m2 | Freq: Once | INTRAVENOUS | Status: DC

## 2016-02-21 MED ORDER — DIPHENHYDRAMINE HCL 25 MG PO CAPS
25.0000 mg | ORAL_CAPSULE | Freq: Once | ORAL | Status: AC
  Administered 2016-02-21: 25 mg via ORAL
  Filled 2016-02-21: qty 1

## 2016-02-21 MED ORDER — SODIUM CHLORIDE 0.9 % IV SOLN: 10.0000 mg | Freq: Once | INTRAVENOUS | Status: DC

## 2016-02-21 MED ORDER — PALONOSETRON HCL INJECTION 0.25 MG/5ML
0.2500 mg | Freq: Once | INTRAVENOUS | Status: AC
  Administered 2016-02-21: 0.25 mg via INTRAVENOUS
  Filled 2016-02-21: qty 5

## 2016-02-21 MED ORDER — SODIUM CHLORIDE 0.9 % IV SOLN
800.0000 mg/m2 | Freq: Once | INTRAVENOUS | Status: AC
  Administered 2016-02-21: 1800 mg via INTRAVENOUS
  Filled 2016-02-21: qty 90

## 2016-02-21 MED ORDER — VINCRISTINE SULFATE CHEMO INJECTION 1 MG/ML
2.0000 mg | Freq: Once | INTRAVENOUS | Status: AC
  Administered 2016-02-21: 2 mg via INTRAVENOUS
  Filled 2016-02-21: qty 2

## 2016-02-21 NOTE — Progress Notes (Signed)
Offers no complaints. Feeling well. 

## 2016-03-11 NOTE — Progress Notes (Signed)
.  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 He reports feeling fatigued, slightly weak, having slight shortness of breath when golfing, and noticing darkening skin lesions after treatment, which resolve within a week or two.  Otherwise he is tolerating his treatments well without significant side effects. Patient denies any weight loss, fevers, chills, night sweats,  He denies any abdominal pain. He has no nausea, vomiting, constipation, or diarrhea.  He denies any chest pain.  He has no neurological complaints. He has no urinary complaints. Patient reports having "sniffles", but no cough.  REVIEW OF SYSTEMS:   Review of Systems  Constitutional: Positive for malaise/fatigue. Negative for chills, fever and weight loss.  HENT: Positive for congestion. Negative for sore throat.   Respiratory: Positive for shortness of breath. Negative for cough and hemoptysis.   Cardiovascular: Negative.  Negative for chest pain.  Gastrointestinal: Negative for abdominal pain, constipation, diarrhea, nausea and vomiting.  Musculoskeletal: Negative.   Neurological: Positive for weakness. Negative for dizziness and headaches.  Endo/Heme/Allergies: Does not bruise/bleed easily.  Psychiatric/Behavioral: The patient is not nervous/anxious and does not have insomnia.     As per HPI. Otherwise, a complete review of systems is negative.  PAST MEDICAL HISTORY: Past Medical History:  Diagnosis Date  . Acid reflux 06/03/2015  . Adenomatous colon polyp 08/12/2014   Overview:  On 05/25/2011 colonoscopy; repeat 05/2016  (Dr. Gustavo Lah)   . BPH (benign prostatic hyperplasia)   . Cancer ([place])    LYMPHOMA  . Dribbling   . ED (erectile dysfunction)   . Essential (primary) hypertension 06/03/2015  . GERD (gastroesophageal reflux disease)   . Glaucoma 08/07/2013  . Hesitancy   . HTN (hypertension)   . Hypertension   . Hypogonadism in male   . Nocturia   . Obese   . Urgency-frequency syndrome     PAST SURGICAL HISTORY: Past Surgical History:  Procedure Laterality Date  . BOWEL RESECTION  09/18/2015   Procedure: SMALL BOWEL RESECTION;  Surgeon: Clayburn Pert, MD;  Location: [facility] ORS;  Service: General;;  with anastomosis  . BOWEL RESECTION    . HERNIA REPAIR    . LAPAROSCOPY  09/18/2015   Procedure: LAPAROSCOPY DIAGNOSTIC;  Surgeon: Clayburn Pert, MD;  Location: [facility] ORS;  Service: General;;  . LAPAROTOMY N/A 09/18/2015   Procedure: EXPLORATORY LAPAROTOMY;  Surgeon: Clayburn Pert, MD;  Location: [facility] ORS;  Service: General;  Laterality: N/A;    FAMILY HISTORY: Family History  Problem Relation Age of Onset  . Prostate cancer Father   . Cancer Father   . Cancer      family members in general  . Cancer Sister   . Clotting disorder Sister   . Cancer Brother   . Bladder Cancer Neg Hx   . Kidney cancer Neg Hx        ADVANCED DIRECTIVES:    HEALTH MAINTENANCE: Social History  Substance Use Topics  . Smoking status: Former Smoker    Types: Cigars    Quit date: 04/10/2015  . Smokeless tobacco: Never Used     Comment: occasional cigar  . Alcohol use 0.0 oz/week  Comment: 1-2 drinks of Liquor Daily.     Colonoscopy:  PAP:  Bone density:  Lipid panel:  No Known Allergies  Current Outpatient Prescriptions  Medication Sig Dispense Refill  . aspirin EC 81 MG tablet Take 81 mg by mouth daily.    . chlorhexidine (PERIDEX) 0.12 % solution 1 mL by Mouth Rinse route 1 day or 1 dose.    . finasteride (PROSCAR) 5 MG tablet Take 1 tablet (5 mg total) by mouth daily. 90 tablet 3  .  latanoprost (XALATAN) 0.005 % ophthalmic solution Place 1 drop into both eyes every morning.     Marland Kitchen lisinopril-hydrochlorothiazide (PRINZIDE,ZESTORETIC) 10-12.5 MG tablet Take 1 tablet by mouth daily. 30 tablet 0  . Omega-3 Fatty Acids (FISH OIL) 1200 MG CAPS Take 1,200-2,400 mg by mouth See admin instructions. Take 2 capsules (24102m) by mouth every morning and 1 capsule by mouth in the evening.    . ondansetron (ZOFRAN) 8 MG tablet Take 1 tablet (8 mg total) by mouth 2 (two) times daily as needed for refractory nausea / vomiting. Start on day 3 after chemotherapy. 30 tablet 1  . predniSONE (DELTASONE) 20 MG tablet Take 5 tablets (100 mg total) by mouth daily. Take on days 1-5 of each chemotherapy cycle. 25 tablet 5  . prochlorperazine (COMPAZINE) 10 MG tablet Take 1 tablet (10 mg total) by mouth every 6 (six) hours as needed (Nausea or vomiting). 30 tablet 1  . shark liver oil-cocoa butter (PREPARATION H) 0.25-3-85.5 % suppository Place 1 suppository rectally daily as needed for hemorrhoids.    . timolol (TIMOPTIC) 0.5 % ophthalmic solution Apply 1 drop to eye every morning.      No current facility-administered medications for this visit.     OBJECTIVE: Vitals:   03/13/16 0922  BP: (!) 138/93  Pulse: 67  Resp: 18  Temp: (!) 96.9 F (36.1 C)     Body mass index is 37.93 kg/m.    ECOG FS:0 - Asymptomatic  General: Well-developed, well-nourished, no acute distress. Eyes: Pink conjunctiva, anicteric sclera. Lungs: Clear to auscultation bilaterally. Heart: Regular rate and rhythm. No rubs, murmurs, or gallops. Abdomen: Soft, nontender, nondistended. No organomegaly noted, normoactive bowel sounds. Musculoskeletal: No edema, cyanosis, or clubbing. Neuro: Alert, answering all questions appropriately. Cranial nerves grossly intact. Skin: No rashes or petechiae noted. Psych: Normal affect. Lymphatics: No cervical, calvicular, axillary or inguinal LAD.   LAB RESULTS:  Lab Results    Component Value Date   NA 135 03/13/2016   K 3.9 03/13/2016   CL 103 03/13/2016   CO2 24 03/13/2016   GLUCOSE 130 (H) 03/13/2016   BUN 22 (H) 03/13/2016   CREATININE 1.10 03/13/2016   CALCIUM 9.4 03/13/2016   PROT 7.2 03/13/2016   ALBUMIN 3.9 03/13/2016   AST 27 03/13/2016   ALT 23 03/13/2016   ALKPHOS 69 03/13/2016   BILITOT 0.7 03/13/2016   GFRNONAA >60 03/13/2016   GFRAA >60 03/13/2016    Lab Results  Component Value Date   WBC 3.8 03/13/2016   NEUTROABS 1.8 03/13/2016   HGB 12.6 (L) 03/13/2016   HCT 36.6 (L) 03/13/2016   MCV 88.6 03/13/2016   PLT 175 03/13/2016     STUDIES: No results found.  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 Because of the extensive extranodal involvement of the  terminal illium, this is considered stage IV disease. PET scan results reviewed independently with no obvious disease elsewhere in patient's body. Bone marrow biopsy was negative. Plan to give a total of 6 treatments every 3 weeks. Patient has declined port placement. Proceed with cycle 6 of 6 of Rituxan plus CVP. Return to clinic in 3 months for PET scan reimaging.  He will also likely require a repeat colonoscopy.   Approximately 30 minutes was spent in discussion of which greater than 50% was consultation.  Patient expressed understanding and was in agreement with this plan. He also understands that He can call clinic at any time with any questions, concerns, or complaints.   Diffuse large B cell lymphoma ([place])   Staging form: Lymphoid Neoplasms, AJCC 6th Edition   - Clinical stage from 10/09/2015: Stage IV - Signed by Lloyd Huger, MD on 10/09/2015  Lucendia Herrlich, NP   03/13/2016 10:03 AM   Patient was seen and evaluated independently and I agree with the  assessment and plan as dictated above.  Lloyd Huger, MD 03/18/16 1:09 PM

## 2016-03-13 ENCOUNTER — Inpatient Hospital Stay: Payer: PPO

## 2016-03-13 ENCOUNTER — Inpatient Hospital Stay (HOSPITAL_BASED_OUTPATIENT_CLINIC_OR_DEPARTMENT_OTHER): Payer: PPO | Admitting: Oncology

## 2016-03-13 VITALS — BP 138/93 | HR 67 | Temp 96.9°F | Resp 18 | Wt 264.3 lb

## 2016-03-13 DIAGNOSIS — C8339 Diffuse large B-cell lymphoma, extranodal and solid organ sites: Secondary | ICD-10-CM

## 2016-03-13 DIAGNOSIS — Z79899 Other long term (current) drug therapy: Secondary | ICD-10-CM

## 2016-03-13 DIAGNOSIS — Z5111 Encounter for antineoplastic chemotherapy: Secondary | ICD-10-CM | POA: Diagnosis not present

## 2016-03-13 LAB — CBC WITH DIFFERENTIAL/PLATELET
Basophils Absolute: 0.1 10*3/uL (ref 0–0.1)
Basophils Relative: 3 %
EOS ABS: 0.3 10*3/uL (ref 0–0.7)
EOS PCT: 8 %
HCT: 36.6 % — ABNORMAL LOW (ref 40.0–52.0)
HEMOGLOBIN: 12.6 g/dL — AB (ref 13.0–18.0)
LYMPHS ABS: 0.7 10*3/uL — AB (ref 1.0–3.6)
Lymphocytes Relative: 17 %
MCH: 30.4 pg (ref 26.0–34.0)
MCHC: 34.3 g/dL (ref 32.0–36.0)
MCV: 88.6 fL (ref 80.0–100.0)
MONO ABS: 1 10*3/uL (ref 0.2–1.0)
MONOS PCT: 25 %
Neutro Abs: 1.8 10*3/uL (ref 1.4–6.5)
Neutrophils Relative %: 47 %
PLATELETS: 175 10*3/uL (ref 150–440)
RBC: 4.13 MIL/uL — ABNORMAL LOW (ref 4.40–5.90)
RDW: 16.2 % — AB (ref 11.5–14.5)
WBC: 3.8 10*3/uL (ref 3.8–10.6)

## 2016-03-13 LAB — COMPREHENSIVE METABOLIC PANEL
ALK PHOS: 69 U/L (ref 38–126)
ALT: 23 U/L (ref 17–63)
ANION GAP: 8 (ref 5–15)
AST: 27 U/L (ref 15–41)
Albumin: 3.9 g/dL (ref 3.5–5.0)
BUN: 22 mg/dL — ABNORMAL HIGH (ref 6–20)
CALCIUM: 9.4 mg/dL (ref 8.9–10.3)
CO2: 24 mmol/L (ref 22–32)
Chloride: 103 mmol/L (ref 101–111)
Creatinine, Ser: 1.1 mg/dL (ref 0.61–1.24)
GFR calc non Af Amer: >60 mL/min (ref >60)
Glucose, Bld: 130 mg/dL — ABNORMAL HIGH (ref 65–99)
Potassium: 3.9 mmol/L (ref 3.5–5.1)
SODIUM: 135 mmol/L (ref 135–145)
Total Bilirubin: 0.7 mg/dL (ref 0.3–1.2)
Total Protein: 7.2 g/dL (ref 6.5–8.1)

## 2016-03-13 MED ORDER — PALONOSETRON HCL INJECTION 0.25 MG/5ML
0.2500 mg | Freq: Once | INTRAVENOUS | Status: AC
  Administered 2016-03-13: 0.25 mg via INTRAVENOUS
  Filled 2016-03-13: qty 5

## 2016-03-13 MED ORDER — SODIUM CHLORIDE 0.9 % IV SOLN
800.0000 mg/m2 | Freq: Once | INTRAVENOUS | Status: AC
  Administered 2016-03-13: 1800 mg via INTRAVENOUS
  Filled 2016-03-13: qty 90

## 2016-03-13 MED ORDER — SODIUM CHLORIDE 0.9 % IV SOLN: 375.0000 mg/m2 | Freq: Once | INTRAVENOUS | Status: DC

## 2016-03-13 MED ORDER — SODIUM CHLORIDE 0.9 % IV SOLN
900.0000 mg | Freq: Once | INTRAVENOUS | Status: AC
  Administered 2016-03-13: 900 mg via INTRAVENOUS
  Filled 2016-03-13: qty 40

## 2016-03-13 MED ORDER — SODIUM CHLORIDE 0.9 % IV SOLN
Freq: Once | INTRAVENOUS | Status: AC
  Administered 2016-03-13: 11:00:00 via INTRAVENOUS
  Filled 2016-03-13: qty 1000

## 2016-03-13 MED ORDER — VINCRISTINE SULFATE CHEMO INJECTION 1 MG/ML
2.0000 mg | Freq: Once | INTRAVENOUS | Status: AC
  Administered 2016-03-13: 2 mg via INTRAVENOUS
  Filled 2016-03-13: qty 2

## 2016-03-13 MED ORDER — DEXAMETHASONE SODIUM PHOSPHATE 10 MG/ML IJ SOLN
10.0000 mg | Freq: Once | INTRAMUSCULAR | Status: AC
  Administered 2016-03-13: 10 mg via INTRAVENOUS
  Filled 2016-03-13: qty 1

## 2016-03-13 MED ORDER — ACETAMINOPHEN 325 MG PO TABS
650.0000 mg | ORAL_TABLET | Freq: Once | ORAL | Status: AC
  Administered 2016-03-13: 650 mg via ORAL
  Filled 2016-03-13: qty 2

## 2016-03-13 MED ORDER — DIPHENHYDRAMINE HCL 25 MG PO CAPS
25.0000 mg | ORAL_CAPSULE | Freq: Once | ORAL | Status: AC
  Administered 2016-03-13: 25 mg via ORAL
  Filled 2016-03-13: qty 1

## 2016-03-13 NOTE — Progress Notes (Signed)
Patient offers no complaints today. 

## 2016-03-25 ENCOUNTER — Other Ambulatory Visit: Payer: Self-pay | Admitting: *Deleted

## 2016-03-25 ENCOUNTER — Telehealth: Payer: Self-pay | Admitting: *Deleted

## 2016-03-25 ENCOUNTER — Ambulatory Visit
Admission: RE | Admit: 2016-03-25 | Discharge: 2016-03-25 | Disposition: A | Discharge status: Home / Self Care | Payer: PPO | Source: Ambulatory Visit | Attending: Oncology | Admitting: Oncology

## 2016-03-25 DIAGNOSIS — R05 Cough: Secondary | ICD-10-CM

## 2016-03-25 DIAGNOSIS — R059 Cough, unspecified: Secondary | ICD-10-CM

## 2016-03-25 DIAGNOSIS — J449 Chronic obstructive pulmonary disease, unspecified: Secondary | ICD-10-CM | POA: Insufficient documentation

## 2016-03-25 MED ORDER — LEVOFLOXACIN 500 MG PO TABS: 500.0000 mg | ORAL_TABLET | Freq: Every day | ORAL | 0 refills | Status: DC

## 2016-03-25 NOTE — Telephone Encounter (Signed)
Per Dr. Grayland Ormond patient to have chest xray today, Levaquin 500 mg daily for 7 days escribed to patients [facility]). Orders entered.

## 2016-03-25 NOTE — Telephone Encounter (Signed)
Has been sick with a cold for over a week, cough is now down in his chest and sounds terrible. He is getting worse overall. Asking if Dr Grayland Ormond wants to put him on med for it since he had chemo 12/22

## 2016-03-25 NOTE — Telephone Encounter (Signed)
Phoebe advised to take him for a CXR and she confirmed [facility] and will pick up levaquin at [facility]

## 2016-05-28 ENCOUNTER — Other Ambulatory Visit: Payer: PPO

## 2016-05-28 ENCOUNTER — Other Ambulatory Visit: Payer: Self-pay

## 2016-05-28 DIAGNOSIS — N401 Enlarged prostate with lower urinary tract symptoms: Secondary | ICD-10-CM

## 2016-05-29 LAB — PSA: PROSTATE SPECIFIC AG, SERUM: 0.5 ng/mL (ref 0.0–4.0)

## 2016-05-31 NOTE — Progress Notes (Signed)
06/01/2016 8:53 AM   Zachary Robertson Dec 17, 1940 [id number]  Referring provider: No referring provider defined for this encounter.  Chief Complaint  Patient presents with  . Benign Prostatic Hypertrophy    1year follow up    HPI: Patient is 76 year old Caucasian male who presents today for his 1 year follow-up for erectile dysfunction and BPH with LUTS.  Erectile dysfunction His SHIM score is 5, which is severe erectile dysfunction.   His previous SHIM score was 5.  He has been having difficulty with erections for over ten years.   His major complaint is no partner.  His libido is preserved.   His risk factors for ED are age, HTN, BPH and HLD.  He denies any painful erections or curvatures with his erections.   I had given him Viagra samples at his office visit last year, but he has not taken the samples.      SHIM    Row Name 06/01/16 0851         SHIM: Over the last 6 months:   How do you rate your confidence that you could get and keep an erection? Very Low     When you had erections with sexual stimulation, how often were your erections hard enough for penetration (entering your partner)? Almost Never or Never     During sexual intercourse, how often were you able to maintain your erection after you had penetrated (entered) your partner? Extremely Difficult     During sexual intercourse, how difficult was it to maintain your erection to completion of intercourse? Extremely Difficult     When you attempted sexual intercourse, how often was it satisfactory for you? Extremely Difficult       SHIM Total Score   SHIM 5        Score: 1-7 Severe ED 8-11 Moderate ED 12-16 Mild-Moderate ED 17-21 Mild ED 22-25 No ED   BPH WITH LUTS His IPSS score today is 6, which is mild lower urinary tract symptomatology. He is mostly satisfied with his quality life due to his urinary symptoms.  His previous I PSS score was 6/2.  He denies any dysuria, hematuria or suprapubic pain.   He  currently taking finasteride 5 mg daily.  He also denies any recent fevers, chills, nausea or vomiting.   He has a family history of PCa, with his father being diagnosed with prostate cancer.        IPSS    Row Name 06/01/16 0800         International Prostate Symptom Score   How often have you had the sensation of not emptying your bladder? Less than 1 in 5     How often have you had to urinate less than every two hours? Less than half the time     How often have you found you stopped and started again several times when you urinated? Not at All     How often have you found it difficult to postpone urination? Less than 1 in 5 times     How often have you had a weak urinary stream? Less than 1 in 5 times     How often have you had to strain to start urination? Not at All     How many times did you typically get up at night to urinate? 1 Time     Total IPSS Score 6       Quality of Life due to urinary symptoms   If  you were to spend the rest of your life with your urinary condition just the way it is now how would you feel about that? Mostly Satisfied        Score:  1-7 Mild 8-19 Moderate 20-35 Severe   PMH: Past Medical History:  Diagnosis Date  . Acid reflux 06/03/2015  . Adenomatous colon polyp 08/12/2014   Overview:  On 05/25/2011 colonoscopy; repeat 05/2016 (Dr. Gustavo Lah)   . BPH (benign prostatic hyperplasia)   . Cancer ([place])    LYMPHOMA  . Dribbling   . ED (erectile dysfunction)   . Essential (primary) hypertension 06/03/2015  . GERD (gastroesophageal reflux disease)   . Glaucoma 08/07/2013  . Hesitancy   . HTN (hypertension)   . Hypertension   . Hypogonadism in male   . Nocturia   . Obese   . Urgency-frequency syndrome     Surgical History: Past Surgical History:  Procedure Laterality Date  . BOWEL RESECTION  09/18/2015   Procedure: SMALL BOWEL RESECTION;  Surgeon: Clayburn Pert, MD;  Location: [facility] ORS;  Service: General;;  with anastomosis  . BOWEL  RESECTION    . HERNIA REPAIR    . LAPAROSCOPY  09/18/2015   Procedure: LAPAROSCOPY DIAGNOSTIC;  Surgeon: Clayburn Pert, MD;  Location: [facility] ORS;  Service: General;;  . LAPAROTOMY N/A 09/18/2015   Procedure: EXPLORATORY LAPAROTOMY;  Surgeon: Clayburn Pert, MD;  Location: [facility] ORS;  Service: General;  Laterality: N/A;    Home Medications:  Allergies as of 06/01/2016   No Known Allergies     Medication List       Accurate as of 06/01/16  8:53 AM. Always use your most recent med list.          aspirin EC 81 MG tablet Take 81 mg by mouth daily.   finasteride 5 MG tablet Commonly known as:  PROSCAR Take 1 tablet (5 mg total) by mouth daily.   Fish Oil 1200 MG Caps Take 1,200-2,400 mg by mouth See admin instructions. Take 2 capsules (2400mg ) by mouth every morning and 1 capsule by mouth in the evening.   latanoprost 0.005 % ophthalmic solution Commonly known as:  XALATAN Place 1 drop into both eyes every morning.   lisinopril-hydrochlorothiazide 10-12.5 MG tablet Commonly known as:  PRINZIDE,ZESTORETIC Take 1 tablet by mouth daily.   timolol 0.5 % ophthalmic solution Commonly known as:  TIMOPTIC Apply 1 drop to eye every morning.       Allergies: No Known Allergies  Family History: Family History  Problem Relation Age of Onset  . Prostate cancer Father   . Cancer Father   . Cancer      family members in general  . Cancer Sister   . Clotting disorder Sister   . Cancer Brother   . Bladder Cancer Neg Hx   . Kidney cancer Neg Hx     Social History:  reports that he quit smoking about 13 months ago. His smoking use included Cigars. He has never used smokeless tobacco. He reports that he drinks alcohol. He reports that he does not use drugs.  ROS: UROLOGY Frequent Urination?: No Hard to postpone urination?: No Burning/pain with urination?: No Get up at night to urinate?: No Leakage of urine?: No Urine stream starts and stops?: No Trouble starting stream?:  No Do you have to strain to urinate?: No Blood in urine?: No Urinary tract infection?: No Sexually transmitted disease?: No Injury to kidneys or bladder?: No Painful intercourse?: No Weak stream?: No Erection problems?:  No Penile pain?: No  Gastrointestinal Nausea?: No Vomiting?: No Indigestion/heartburn?: No Diarrhea?: No Constipation?: No  Constitutional Fever: No Night sweats?: No Weight loss?: No Fatigue?: No  Skin Skin rash/lesions?: No Itching?: No  Eyes Blurred vision?: No Double vision?: No  Ears/Nose/Throat Sore throat?: No Sinus problems?: No  Hematologic/Lymphatic Swollen glands?: No Easy bruising?: No  Cardiovascular Leg swelling?: No Chest pain?: No  Respiratory Cough?: No Shortness of breath?: No  Endocrine Excessive thirst?: No  Musculoskeletal Back pain?: No Joint pain?: No  Neurological Headaches?: No Dizziness?: No  Psychologic Depression?: No Anxiety?: No  Physical Exam: BP (!) 170/83   Pulse 78   Ht 5\' 10"  (1.778 m)   Wt 261 lb (118.4 kg)   BMI 37.45 kg/m   Constitutional: Well nourished. Alert and oriented, No acute distress. HEENT: [place] AT, moist mucus membranes. Trachea midline, no masses. Cardiovascular: No clubbing, cyanosis, or edema. Respiratory: Normal respiratory effort, no increased work of breathing. GI: Abdomen is soft, non tender, non distended, no abdominal masses. Liver and spleen not palpable.  No hernias appreciated.  Stool sample for occult testing is not indicated.   GU: No CVA tenderness.  No bladder fullness or masses.  Patient with uncircumcised phallus. Foreskin easily retracted.  Crusted smegma is noted.   Urethral meatus is patent.  No penile discharge. No penile lesions or rashes. Scrotum without lesions, cysts, rashes and/or edema.  Testicles are located scrotally bilaterally. No masses are appreciated in the testicles. Left and right epididymis are normal. Rectal: Patient with  normal sphincter  tone. Anus and perineum without scarring or rashes. No rectal masses are appreciated. Prostate is approximately 45 grams, no nodules are appreciated. Seminal vesicles are normal. Skin: No rashes, bruises or suspicious lesions. Lymph: No cervical or inguinal adenopathy. Neurologic: Grossly intact, no focal deficits, moving all 4 extremities. Psychiatric: Normal mood and affect.  Laboratory Data: PSA History  0.3 ng/mL on 05/23/2015    Assessment & Plan:    1. Erectile dysfunction:   SHIM score is 5.  We discussed trying a different PDE5 inhibitor, intra-urethral suppositories, intracavernous vasoactive drug injection therapy, vacuum constriction device and penile prosthesis implantation.   He does not have partner at this time.    2.  BPH with LUTS  - IPSS score is 6/2, it is stable  - Continue conservative management, avoiding bladder irritants and timed voiding's  - Continue finasteride 5 mg daily; refills given  - RTC in 12 months for IPSS and exam   Return in about 1 year (around 06/01/2017) for IPSS and exam.  These notes generated with voice recognition software. I apologize for typographical errors.  Zara Council, [facility] [address] [place], [place] [address] [phone]

## 2016-06-01 ENCOUNTER — Encounter: Payer: Self-pay | Admitting: Urology

## 2016-06-01 ENCOUNTER — Ambulatory Visit: Payer: PPO | Admitting: Urology

## 2016-06-01 VITALS — BP 170/83 | HR 78 | Ht 70.0 in | Wt 261.0 lb

## 2016-06-01 DIAGNOSIS — N138 Other obstructive and reflux uropathy: Secondary | ICD-10-CM | POA: Diagnosis not present

## 2016-06-01 DIAGNOSIS — N529 Male erectile dysfunction, unspecified: Secondary | ICD-10-CM

## 2016-06-01 DIAGNOSIS — N401 Enlarged prostate with lower urinary tract symptoms: Secondary | ICD-10-CM | POA: Diagnosis not present

## 2016-06-01 MED ORDER — FINASTERIDE 5 MG PO TABS: 5.0000 mg | ORAL_TABLET | Freq: Every day | ORAL | 3 refills | Status: DC

## 2016-06-08 ENCOUNTER — Ambulatory Visit
Admission: RE | Admit: 2016-06-08 | Discharge: 2016-06-08 | Disposition: A | Discharge status: Home / Self Care | Payer: PPO | Source: Ambulatory Visit | Attending: Oncology | Admitting: Oncology

## 2016-06-08 DIAGNOSIS — C8339 Diffuse large B-cell lymphoma, extranodal and solid organ sites: Secondary | ICD-10-CM

## 2016-06-08 DIAGNOSIS — C8330 Diffuse large B-cell lymphoma, unspecified site: Secondary | ICD-10-CM | POA: Diagnosis not present

## 2016-06-08 DIAGNOSIS — I2510 Atherosclerotic heart disease of native coronary artery without angina pectoris: Secondary | ICD-10-CM | POA: Diagnosis not present

## 2016-06-08 DIAGNOSIS — I700 Atherosclerosis of aorta: Secondary | ICD-10-CM | POA: Insufficient documentation

## 2016-06-08 LAB — GLUCOSE, CAPILLARY: GLUCOSE-CAPILLARY: 114 mg/dL — AB (ref 65–99)

## 2016-06-08 MED ORDER — FLUDEOXYGLUCOSE F - 18 (FDG) INJECTION
12.8300 | Freq: Once | INTRAVENOUS | Status: AC | PRN
  Administered 2016-06-08: 12.83 via INTRAVENOUS

## 2016-06-11 NOTE — Progress Notes (Signed)
.  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 He reports feeling well and gaining weight. He finished treatments Rituxin + CVP in December 2017 without problems or side effects. Patient denies any weight loss, fevers, chills, night sweats,  He denies any abdominal pain. He has no nausea, vomiting, constipation, or diarrhea.  He denies any chest pain.  He has no neurological complaints. He has no urinary complaints.  REVIEW OF SYSTEMS:   Review of Systems  Constitutional: Negative for chills, fever, malaise/fatigue and weight loss.       Weight Gain  HENT: Negative for congestion and sore throat.   Respiratory: Negative for cough, hemoptysis and shortness of breath.   Cardiovascular: Negative.  Negative for chest pain.  Gastrointestinal: Negative for abdominal pain, constipation, diarrhea, nausea and vomiting.  Musculoskeletal: Negative.   Neurological: Negative for dizziness, weakness and headaches.  Endo/Heme/Allergies: Does not bruise/bleed easily.  Psychiatric/Behavioral: The patient is not nervous/anxious and does not have insomnia.     As per HPI. Otherwise, a complete review of systems is negative.  PAST MEDICAL HISTORY: Past Medical History:  Diagnosis Date  . Acid reflux 06/03/2015  . Adenomatous colon polyp 08/12/2014   Overview:  On 05/25/2011 colonoscopy; repeat 05/2016 (Dr. Gustavo Lah)   . BPH (benign prostatic hyperplasia)   . Cancer ([place])    LYMPHOMA  . Dribbling   . ED (erectile dysfunction)   . Essential (primary) hypertension 06/03/2015  . GERD (gastroesophageal reflux disease)   . Glaucoma 08/07/2013   . Hesitancy   . HTN (hypertension)   . Hypertension   . Hypogonadism in male   . Nocturia   . Obese   . Urgency-frequency syndrome     PAST SURGICAL HISTORY: Past Surgical History:  Procedure Laterality Date  . BOWEL RESECTION  09/18/2015   Procedure: SMALL BOWEL RESECTION;  Surgeon: Clayburn Pert, MD;  Location: [facility] ORS;  Service: General;;  with anastomosis  . BOWEL RESECTION    . HERNIA REPAIR    . LAPAROSCOPY  09/18/2015   Procedure: LAPAROSCOPY DIAGNOSTIC;  Surgeon: Clayburn Pert, MD;  Location: [facility] ORS;  Service: General;;  . LAPAROTOMY N/A 09/18/2015   Procedure: EXPLORATORY LAPAROTOMY;  Surgeon: Clayburn Pert, MD;  Location: [facility] ORS;  Service: General;  Laterality: N/A;    FAMILY HISTORY: Family History  Problem Relation Age of Onset  . Prostate cancer Father   . Cancer Father   . Cancer      family members in general  . Cancer Sister   . Clotting disorder Sister   . Cancer Brother   . Bladder Cancer Neg Hx   . Kidney cancer Neg Hx        ADVANCED DIRECTIVES:    HEALTH MAINTENANCE: Social History  Substance Use Topics  . Smoking status: Former Smoker    Types: Cigars    Quit date: 04/10/2015  . Smokeless tobacco: Never Used     Comment: occasional cigar  . Alcohol use 0.0 oz/week     Comment: 1-2 drinks of Liquor Daily.     Colonoscopy:  PAP:  Bone density:  Lipid panel:  No Known Allergies  Current Outpatient Prescriptions  Medication Sig  Dispense Refill  . aspirin EC 81 MG tablet Take 81 mg by mouth daily.    . finasteride (PROSCAR) 5 MG tablet Take 1 tablet (5 mg total) by mouth daily. 90 tablet 3  . latanoprost (XALATAN) 0.005 % ophthalmic solution Place 1 drop into both eyes every morning.     Marland Kitchen lisinopril-hydrochlorothiazide (PRINZIDE,ZESTORETIC) 10-12.5 MG tablet Take 1 tablet by mouth daily. 30 tablet 0  . Omega-3 Fatty Acids (FISH OIL) 1200 MG CAPS Take 1,200-2,400 mg by mouth See admin instructions. Take 2 capsules (2489m) by  mouth every morning and 1 capsule by mouth in the evening.    . timolol (TIMOPTIC) 0.5 % ophthalmic solution Apply 1 drop to eye every morning.      No current facility-administered medications for this visit.     OBJECTIVE: Vitals:   06/12/16 1055  BP: (!) 160/97  Pulse: 69  Resp: 18  Temp: (!) 96.2 F (35.7 C)     Body mass index is 36.74 kg/m.    ECOG FS:0 - Asymptomatic  General: Well-developed, well-nourished, no acute distress. Eyes: Pink conjunctiva, anicteric sclera. Lungs: Clear to auscultation bilaterally. Heart: Regular rate and rhythm. No rubs, murmurs, or gallops. Abdomen: Soft, nontender, nondistended. No organomegaly noted, normoactive bowel sounds. Musculoskeletal: No edema, cyanosis, or clubbing. Neuro: Alert, answering all questions appropriately. Cranial nerves grossly intact. Skin: No rashes or petechiae noted. Psych: Normal affect. Lymphatics: No cervical, calvicular, axillary or inguinal LAD.   LAB RESULTS:  Lab Results  Component Value Date   NA 135 06/12/2016   K 4.4 06/12/2016   CL 102 06/12/2016   CO2 26 06/12/2016   GLUCOSE 117 (H) 06/12/2016   BUN 22 (H) 06/12/2016   CREATININE 1.01 06/12/2016   CALCIUM 9.4 06/12/2016   PROT 7.4 06/12/2016   ALBUMIN 4.1 06/12/2016   AST 32 06/12/2016   ALT 28 06/12/2016   ALKPHOS 76 06/12/2016   BILITOT 0.9 06/12/2016   GFRNONAA >60 06/12/2016   GFRAA >60 06/12/2016    Lab Results  Component Value Date   WBC 5.4 06/12/2016   NEUTROABS 2.6 06/12/2016   HGB 14.4 06/12/2016   HCT 42.6 06/12/2016   MCV 89.6 06/12/2016   PLT 201 06/12/2016     STUDIES: Nm Pet Image Restag (ps) Skull Base To Thigh  Result Date: 06/08/2016 CLINICAL DATA:  Subsequent treatment strategy for large B-cell lymphoma. EXAM: NUCLEAR MEDICINE PET SKULL BASE TO THIGH TECHNIQUE: 12.8 mCi F-18 FDG was injected intravenously. Full-ring PET imaging was performed from the skull base to thigh after the radiotracer. CT data was  obtained and used for attenuation correction and anatomic localization. FASTING BLOOD GLUCOSE:  Value: 114 mg/dl COMPARISON:  10/10/2015. FINDINGS: NECK No hypermetabolic lymph nodes in the neck. CT images show no acute findings. CHEST No hypermetabolic mediastinal, hilar or axillary lymph nodes. No hypermetabolic pulmonary nodules. Atherosclerotic calcification of the arterial vasculature, including coronary arteries. Aberrant right subclavian artery. Heart is at the upper limits of normal in size to mildly enlarged. ABDOMEN/PELVIS No abnormal hypermetabolism in the liver, adrenal glands, spleen or pancreas. No hypermetabolic lymph nodes. Specifically, previously seen mesenteric lymph node is no longer visualized. Liver, gallbladder and adrenal glands are unremarkable. Low-attenuation lesions in the kidneys measure up to 3.1 cm on the left, difficult to further characterize without post-contrast imaging. Spleen, pancreas, stomach and bowel are grossly unremarkable. An anastomosis is seen in distal small bowel in the right lower quadrant. Atherosclerotic calcification of the arterial vasculature without abdominal aortic aneurysm.  No free fluid. SKELETON No abnormal osseous hypermetabolism. IMPRESSION: 1. No evidence of residual lymphoma in the neck, chest, abdomen or pelvis. 2. Aortic atherosclerosis (ICD10-170.0). Coronary artery calcification. Electronically Signed   By: Lorin Picket M.D.   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 Because of the extensive extranodal involvement of the terminal illium, this is considered stage IV disease. Bone marrow biopsy was negative. Gave a total of 6 treatments of Rituxin + RVP every 3 weeks with last dose on March 13, 2016.  Most recent  PET scan results from March 2018 revealed no obvious disease elsewhere in patient's body. Return to clinic in 6 months for repeat PET scan.    2. Schedule colonoscopy with Dr. Gustavo Lah. Patient has received reminder in mail and will be getting in touch with Dr. Gustavo Lah office.   Patient expressed understanding and was in agreement with this plan. He also understands that He can call clinic at any time with any questions, concerns, or complaints.   Cancer Staging Diffuse large B cell lymphoma ([place]) Staging form: Lymphoid Neoplasms, AJCC 6th Edition - Clinical stage from 10/09/2015: Stage IV - Signed by Lloyd Huger, MD on 10/09/2015   Faythe Casa, NP  Patient was seen and evaluated independently and I agree with the assessment and plan as dictated above. The patient also noted to have mildly elevated blood pressure. Continue monitoring treatment per primary care.  Lloyd Huger, MD 06/12/16 12:03 PM

## 2016-06-12 ENCOUNTER — Inpatient Hospital Stay (HOSPITAL_BASED_OUTPATIENT_CLINIC_OR_DEPARTMENT_OTHER): Payer: PPO | Admitting: Oncology

## 2016-06-12 ENCOUNTER — Inpatient Hospital Stay: Payer: PPO | Attending: Oncology

## 2016-06-12 VITALS — BP 160/97 | HR 69 | Temp 96.2°F | Resp 18 | Wt 256.1 lb

## 2016-06-12 DIAGNOSIS — C8339 Diffuse large B-cell lymphoma, extranodal and solid organ sites: Secondary | ICD-10-CM | POA: Diagnosis not present

## 2016-06-12 DIAGNOSIS — N400 Enlarged prostate without lower urinary tract symptoms: Secondary | ICD-10-CM | POA: Insufficient documentation

## 2016-06-12 DIAGNOSIS — I10 Essential (primary) hypertension: Secondary | ICD-10-CM | POA: Diagnosis not present

## 2016-06-12 DIAGNOSIS — Z9221 Personal history of antineoplastic chemotherapy: Secondary | ICD-10-CM | POA: Insufficient documentation

## 2016-06-12 DIAGNOSIS — H409 Unspecified glaucoma: Secondary | ICD-10-CM | POA: Diagnosis not present

## 2016-06-12 DIAGNOSIS — Z7982 Long term (current) use of aspirin: Secondary | ICD-10-CM | POA: Insufficient documentation

## 2016-06-12 DIAGNOSIS — K219 Gastro-esophageal reflux disease without esophagitis: Secondary | ICD-10-CM | POA: Diagnosis not present

## 2016-06-12 DIAGNOSIS — Z87891 Personal history of nicotine dependence: Secondary | ICD-10-CM | POA: Diagnosis not present

## 2016-06-12 DIAGNOSIS — I700 Atherosclerosis of aorta: Secondary | ICD-10-CM | POA: Diagnosis not present

## 2016-06-12 DIAGNOSIS — E669 Obesity, unspecified: Secondary | ICD-10-CM | POA: Insufficient documentation

## 2016-06-12 LAB — COMPREHENSIVE METABOLIC PANEL
ALT: 28 U/L (ref 17–63)
AST: 32 U/L (ref 15–41)
Albumin: 4.1 g/dL (ref 3.5–5.0)
Alkaline Phosphatase: 76 U/L (ref 38–126)
Anion gap: 7 (ref 5–15)
BUN: 22 mg/dL — AB (ref 6–20)
CHLORIDE: 102 mmol/L (ref 101–111)
CO2: 26 mmol/L (ref 22–32)
Calcium: 9.4 mg/dL (ref 8.9–10.3)
Creatinine, Ser: 1.01 mg/dL (ref 0.61–1.24)
GFR calc Af Amer: >60 mL/min (ref >60)
GLUCOSE: 117 mg/dL — AB (ref 65–99)
Potassium: 4.4 mmol/L (ref 3.5–5.1)
SODIUM: 135 mmol/L (ref 135–145)
Total Bilirubin: 0.9 mg/dL (ref 0.3–1.2)
Total Protein: 7.4 g/dL (ref 6.5–8.1)

## 2016-06-12 LAB — CBC WITH DIFFERENTIAL/PLATELET
Basophils Absolute: 0.2 10*3/uL — ABNORMAL HIGH (ref 0–0.1)
Basophils Relative: 3 %
EOS ABS: 0.6 10*3/uL (ref 0–0.7)
EOS PCT: 10 %
HCT: 42.6 % (ref 40.0–52.0)
HEMOGLOBIN: 14.4 g/dL (ref 13.0–18.0)
LYMPHS ABS: 0.9 10*3/uL — AB (ref 1.0–3.6)
Lymphocytes Relative: 17 %
MCH: 30.2 pg (ref 26.0–34.0)
MCHC: 33.7 g/dL (ref 32.0–36.0)
MCV: 89.6 fL (ref 80.0–100.0)
MONOS PCT: 23 %
Monocytes Absolute: 1.2 10*3/uL — ABNORMAL HIGH (ref 0.2–1.0)
NEUTROS PCT: 47 %
Neutro Abs: 2.6 10*3/uL (ref 1.4–6.5)
Platelets: 201 10*3/uL (ref 150–440)
RBC: 4.75 MIL/uL (ref 4.40–5.90)
RDW: 14.5 % (ref 11.5–14.5)
WBC: 5.4 10*3/uL (ref 3.8–10.6)

## 2016-06-12 NOTE — Progress Notes (Signed)
Offers no complaints. States is feeling well. 

## 2016-06-15 ENCOUNTER — Ambulatory Visit: Payer: PPO | Admitting: Oncology

## 2016-06-15 ENCOUNTER — Other Ambulatory Visit: Payer: PPO

## 2016-06-15 DIAGNOSIS — H401131 Primary open-angle glaucoma, bilateral, mild stage: Secondary | ICD-10-CM | POA: Diagnosis not present

## 2016-08-12 DIAGNOSIS — Z86010 Personal history of colonic polyps: Secondary | ICD-10-CM | POA: Diagnosis not present

## 2016-08-12 DIAGNOSIS — C8330 Diffuse large B-cell lymphoma, unspecified site: Secondary | ICD-10-CM | POA: Diagnosis not present

## 2016-08-19 DIAGNOSIS — Z79899 Other long term (current) drug therapy: Secondary | ICD-10-CM | POA: Diagnosis not present

## 2016-08-19 DIAGNOSIS — I700 Atherosclerosis of aorta: Secondary | ICD-10-CM | POA: Diagnosis not present

## 2016-08-19 DIAGNOSIS — N401 Enlarged prostate with lower urinary tract symptoms: Secondary | ICD-10-CM | POA: Diagnosis not present

## 2016-08-19 DIAGNOSIS — I10 Essential (primary) hypertension: Secondary | ICD-10-CM | POA: Diagnosis not present

## 2016-08-19 DIAGNOSIS — R351 Nocturia: Secondary | ICD-10-CM | POA: Diagnosis not present

## 2016-08-19 DIAGNOSIS — E559 Vitamin D deficiency, unspecified: Secondary | ICD-10-CM | POA: Diagnosis not present

## 2016-08-19 DIAGNOSIS — E782 Mixed hyperlipidemia: Secondary | ICD-10-CM | POA: Diagnosis not present

## 2016-08-19 DIAGNOSIS — C8513 Unspecified B-cell lymphoma, intra-abdominal lymph nodes: Secondary | ICD-10-CM | POA: Diagnosis not present

## 2016-08-19 DIAGNOSIS — D126 Benign neoplasm of colon, unspecified: Secondary | ICD-10-CM | POA: Diagnosis not present

## 2016-08-19 DIAGNOSIS — R7302 Impaired glucose tolerance (oral): Secondary | ICD-10-CM | POA: Diagnosis not present

## 2016-08-26 DIAGNOSIS — I700 Atherosclerosis of aorta: Secondary | ICD-10-CM | POA: Diagnosis not present

## 2016-08-26 DIAGNOSIS — E782 Mixed hyperlipidemia: Secondary | ICD-10-CM | POA: Diagnosis not present

## 2016-08-26 DIAGNOSIS — R7302 Impaired glucose tolerance (oral): Secondary | ICD-10-CM | POA: Diagnosis not present

## 2016-11-23 ENCOUNTER — Ambulatory Visit: Payer: PPO

## 2016-11-24 ENCOUNTER — Ambulatory Visit
Admission: RE | Admit: 2016-11-24 | Discharge: 2016-11-24 | Disposition: A | Discharge status: Home / Self Care | Payer: PPO | Source: Ambulatory Visit | Attending: Oncology | Admitting: Oncology

## 2016-11-24 DIAGNOSIS — I700 Atherosclerosis of aorta: Secondary | ICD-10-CM | POA: Diagnosis not present

## 2016-11-24 DIAGNOSIS — K5730 Diverticulosis of large intestine without perforation or abscess without bleeding: Secondary | ICD-10-CM | POA: Diagnosis not present

## 2016-11-24 DIAGNOSIS — Q278 Other specified congenital malformations of peripheral vascular system: Secondary | ICD-10-CM | POA: Diagnosis not present

## 2016-11-24 DIAGNOSIS — C8339 Diffuse large B-cell lymphoma, extranodal and solid organ sites: Secondary | ICD-10-CM | POA: Diagnosis not present

## 2016-11-24 DIAGNOSIS — I2510 Atherosclerotic heart disease of native coronary artery without angina pectoris: Secondary | ICD-10-CM | POA: Diagnosis not present

## 2016-11-24 DIAGNOSIS — C83398 Diffuse large b-cell lymphoma of other extranodal and solid organ sites: Secondary | ICD-10-CM

## 2016-11-24 DIAGNOSIS — K449 Diaphragmatic hernia without obstruction or gangrene: Secondary | ICD-10-CM | POA: Diagnosis not present

## 2016-11-24 DIAGNOSIS — C8330 Diffuse large B-cell lymphoma, unspecified site: Secondary | ICD-10-CM | POA: Diagnosis not present

## 2016-11-24 LAB — GLUCOSE, CAPILLARY: GLUCOSE-CAPILLARY: 112 mg/dL — AB (ref 65–99)

## 2016-11-24 MED ORDER — FLUDEOXYGLUCOSE F - 18 (FDG) INJECTION
12.0000 | Freq: Once | INTRAVENOUS | Status: AC | PRN
  Administered 2016-11-24: 12.83 via INTRAVENOUS

## 2016-11-25 NOTE — Progress Notes (Signed)
.  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 He continues to feel mildly fatigued but this does not affect his day-to-day activity. He remains active and golfing several times a week. He currently feels well and is asymptomatic. Patient denies any weight loss, fevers, chills, night sweats,  He denies any abdominal pain or changes in his bowel movements. He has no nausea, vomiting, constipation, or diarrhea.  He denies any chest pain or shortness of breath.  He has no neurological complaints. He has no urinary complaints. Patient offers no specific complaints today.  REVIEW OF SYSTEMS:   Review of Systems  Constitutional: Negative for chills, fever, malaise/fatigue and weight loss.  HENT: Negative for congestion and sore throat.   Respiratory: Negative.  Negative for cough, hemoptysis and shortness of breath.   Cardiovascular: Negative.  Negative for chest pain.  Gastrointestinal: Negative.  Negative for abdominal pain, blood in stool, constipation, diarrhea, melena, nausea and vomiting.  Genitourinary: Negative.   Musculoskeletal: Negative.   Skin: Negative.  Negative for rash.  Neurological: Negative.  Negative for dizziness, weakness and headaches.  Endo/Heme/Allergies: Does not bruise/bleed easily.  Psychiatric/Behavioral: Negative.  The patient is not nervous/anxious and does not have insomnia.     As per HPI. Otherwise, a complete review of systems is negative.  PAST MEDICAL HISTORY: Past Medical History:  Diagnosis Date  . Acid reflux 06/03/2015  . Adenomatous colon polyp 08/12/2014   Overview:  On 05/25/2011 colonoscopy; repeat 05/2016 (Dr. Gustavo Lah)   . BPH (benign prostatic hyperplasia)   . Cancer ([place])    LYMPHOMA  . Dribbling   . ED (erectile dysfunction)   . Essential (primary) hypertension 06/03/2015  . GERD (gastroesophageal reflux disease)   . Glaucoma 08/07/2013  . Hesitancy   . HTN (hypertension)   . Hypertension   . Hypogonadism in male   . Nocturia   . Obese   . Urgency-frequency syndrome     PAST SURGICAL HISTORY: Past Surgical History:  Procedure Laterality Date  . BOWEL RESECTION  09/18/2015   Procedure: SMALL BOWEL RESECTION;  Surgeon: Clayburn Pert, MD;  Location: [facility] ORS;  Service: General;;  with anastomosis  . BOWEL RESECTION    . HERNIA REPAIR    . LAPAROSCOPY  09/18/2015   Procedure: LAPAROSCOPY DIAGNOSTIC;  Surgeon: Clayburn Pert, MD;  Location: [facility] ORS;  Service: General;;  . LAPAROTOMY N/A 09/18/2015   Procedure: EXPLORATORY LAPAROTOMY;  Surgeon: Clayburn Pert, MD;  Location: [facility] ORS;  Service: General;  Laterality: N/A;    FAMILY HISTORY: Family History  Problem Relation Age of Onset  . Prostate cancer Father   . Cancer Father   . Cancer Unknown        family members in general  . Cancer Sister   . Clotting disorder Sister   . Cancer Brother   . Bladder Cancer Neg Hx   . Kidney cancer Neg Hx        ADVANCED DIRECTIVES:    HEALTH MAINTENANCE: Social History  Substance Use Topics  . Smoking status: Former Smoker    Types: Cigars    Quit date: 04/10/2015  . Smokeless tobacco: Never Used  Comment: occasional cigar  . Alcohol use 0.0 oz/week     Comment: 1-2 drinks of Liquor Daily.     Colonoscopy:  PAP:  Bone density:  Lipid panel:  No Known Allergies  Current Outpatient Prescriptions  Medication Sig Dispense Refill  . aspirin EC 81 MG tablet Take 81 mg by mouth daily.    . finasteride (PROSCAR) 5 MG tablet Take 1 tablet (5 mg total) by mouth daily. 90 tablet 3  . latanoprost (XALATAN) 0.005 %  ophthalmic solution Place 1 drop into both eyes every morning.     . Omega-3 Fatty Acids (FISH OIL) 1200 MG CAPS Take 1,200-2,400 mg by mouth See admin instructions. Take 2 capsules (2400mg ) by mouth every morning and 1 capsule by mouth in the evening.    . timolol (TIMOPTIC) 0.5 % ophthalmic solution Apply 1 drop to eye every morning.     Marland Kitchen lisinopril-hydrochlorothiazide (PRINZIDE,ZESTORETIC) 10-12.5 MG tablet Take 1 tablet by mouth daily. 30 tablet 0   No current facility-administered medications for this visit.     OBJECTIVE: Vitals:   11/27/16 0929  BP: (!) 145/81  Pulse: (!) 57  Resp: 18     Body mass index is 35.71 kg/m.    ECOG FS:0 - Asymptomatic  General: Well-developed, well-nourished, no acute distress. Eyes: Pink conjunctiva, anicteric sclera. Lungs: Clear to auscultation bilaterally. Heart: Regular rate and rhythm. No rubs, murmurs, or gallops. Abdomen: Soft, nontender, nondistended. No organomegaly noted, normoactive bowel sounds. Musculoskeletal: No edema, cyanosis, or clubbing. Neuro: Alert, answering all questions appropriately. Cranial nerves grossly intact. Skin: No rashes or petechiae noted. Psych: Normal affect. Lymphatics: No cervical, calvicular, axillary or inguinal LAD.   LAB RESULTS:  Lab Results  Component Value Date   NA 139 11/27/2016   K 4.2 11/27/2016   CL 104 11/27/2016   CO2 28 11/27/2016   GLUCOSE 120 (H) 11/27/2016   BUN 24 (H) 11/27/2016   CREATININE 1.06 11/27/2016   CALCIUM 9.3 11/27/2016   PROT 7.5 11/27/2016   ALBUMIN 4.1 11/27/2016   AST 31 11/27/2016   ALT 27 11/27/2016   ALKPHOS 83 11/27/2016   BILITOT 0.6 11/27/2016   GFRNONAA >60 11/27/2016   GFRAA >60 11/27/2016    Lab Results  Component Value Date   WBC 5.5 11/27/2016   NEUTROABS 2.6 11/27/2016   HGB 14.3 11/27/2016   HCT 41.3 11/27/2016   MCV 92.8 11/27/2016   PLT 178 11/27/2016     STUDIES: Nm Pet Image Restag (ps) Skull Base To Thigh  Result Date:  11/24/2016 CLINICAL DATA:  Subsequent treatment strategy for diffuse large B-cell lymphoma. EXAM: NUCLEAR MEDICINE PET SKULL BASE TO THIGH TECHNIQUE: 12.8 mCi F-18 FDG was injected intravenously. Full-ring PET imaging was performed from the skull base to thigh after the radiotracer. CT data was obtained and used for attenuation correction and anatomic localization. FASTING BLOOD GLUCOSE:  Value: 112 mg/dl COMPARISON:  Multiple exams, including 06/08/2016 FINDINGS: NECK Hypodensity anteriorly along the right lentiform nucleus suspicious for remote lacunar infarct. No hypermetabolic adenopathy in the neck. CHEST No hypermetabolic mediastinal or hilar nodes. No suspicious pulmonary nodules on the CT data. Aberrant right subclavian artery passes behind the esophagus. Coronary, aortic arch, and branch vessel atherosclerotic vascular disease. Small type 1 hiatal hernia. ABDOMEN/PELVIS No abnormal hypermetabolic activity within the liver, pancreas, adrenal glands, or spleen. No hypermetabolic lymph nodes in the abdomen or pelvis. Sigmoid colon diverticulosis. Aortoiliac atherosclerotic vascular disease. SKELETON No focal hypermetabolic activity to suggest skeletal metastasis. Thoracolumbar spondylosis.  Bridging spurring of both sacroiliac joints. IMPRESSION: 1. No PET-CT findings of active lymphoma. 2. Aortic Atherosclerosis (ICD10-I70.0). Coronary atherosclerosis. Aberrant right subclavian artery. 3. Small type 1 hiatal hernia. 4. Sigmoid colon diverticulosis. Electronically Signed   By: Van Clines M.D.   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 Because of the extensive extranodal involvement of the terminal illium, this is considered stage IV disease. PET scan results from  November 24, 2016 reviewed independently and reported as above with no hypermetabolic activity to suggest recurrent disease. Patient has a colonoscopy scheduled in October 2018. He completed his chemotherapy on March 13, 2016. Will continue imaging every 6 months until he is 2 years removed from treatment and then proceed with yearly imaging for 5 years. Return to clinic in 6 months with repeat CT scan and further evaluation.   Approximately 30 minutes was spent in discussion of which greater than 50% was consultation.  Patient expressed understanding and was in agreement with this plan. He also understands that He can call clinic at any time with any questions, concerns, or complaints.   Cancer Staging Diffuse large B cell lymphoma ([place]) Staging form: Lymphoid Neoplasms, AJCC 6th Edition - Clinical stage from 10/09/2015: Stage IV - Signed by Lloyd Huger, MD on 10/09/2015   Lloyd Huger, MD   11/27/2016 9:48 AM

## 2016-11-27 ENCOUNTER — Other Ambulatory Visit: Payer: Self-pay | Admitting: Oncology

## 2016-11-27 ENCOUNTER — Inpatient Hospital Stay: Payer: PPO | Attending: Oncology | Admitting: Oncology

## 2016-11-27 ENCOUNTER — Inpatient Hospital Stay: Payer: PPO

## 2016-11-27 VITALS — BP 145/81 | HR 57 | Resp 18 | Wt 248.9 lb

## 2016-11-27 DIAGNOSIS — Z79899 Other long term (current) drug therapy: Secondary | ICD-10-CM | POA: Insufficient documentation

## 2016-11-27 DIAGNOSIS — Z87891 Personal history of nicotine dependence: Secondary | ICD-10-CM | POA: Insufficient documentation

## 2016-11-27 DIAGNOSIS — K449 Diaphragmatic hernia without obstruction or gangrene: Secondary | ICD-10-CM | POA: Diagnosis not present

## 2016-11-27 DIAGNOSIS — Z9221 Personal history of antineoplastic chemotherapy: Secondary | ICD-10-CM | POA: Diagnosis not present

## 2016-11-27 DIAGNOSIS — I700 Atherosclerosis of aorta: Secondary | ICD-10-CM | POA: Diagnosis not present

## 2016-11-27 DIAGNOSIS — I10 Essential (primary) hypertension: Secondary | ICD-10-CM | POA: Diagnosis not present

## 2016-11-27 DIAGNOSIS — Z86010 Personal history of colonic polyps: Secondary | ICD-10-CM | POA: Diagnosis not present

## 2016-11-27 DIAGNOSIS — K219 Gastro-esophageal reflux disease without esophagitis: Secondary | ICD-10-CM | POA: Diagnosis not present

## 2016-11-27 DIAGNOSIS — C8339 Diffuse large B-cell lymphoma, extranodal and solid organ sites: Secondary | ICD-10-CM

## 2016-11-27 DIAGNOSIS — E669 Obesity, unspecified: Secondary | ICD-10-CM | POA: Diagnosis not present

## 2016-11-27 DIAGNOSIS — Z8673 Personal history of transient ischemic attack (TIA), and cerebral infarction without residual deficits: Secondary | ICD-10-CM | POA: Diagnosis not present

## 2016-11-27 DIAGNOSIS — K5730 Diverticulosis of large intestine without perforation or abscess without bleeding: Secondary | ICD-10-CM | POA: Insufficient documentation

## 2016-11-27 DIAGNOSIS — H409 Unspecified glaucoma: Secondary | ICD-10-CM | POA: Diagnosis not present

## 2016-11-27 DIAGNOSIS — Z7982 Long term (current) use of aspirin: Secondary | ICD-10-CM | POA: Diagnosis not present

## 2016-11-27 DIAGNOSIS — N400 Enlarged prostate without lower urinary tract symptoms: Secondary | ICD-10-CM | POA: Diagnosis not present

## 2016-11-27 LAB — COMPREHENSIVE METABOLIC PANEL
ALT: 27 U/L (ref 17–63)
AST: 31 U/L (ref 15–41)
Albumin: 4.1 g/dL (ref 3.5–5.0)
Alkaline Phosphatase: 83 U/L (ref 38–126)
Anion gap: 7 (ref 5–15)
BILIRUBIN TOTAL: 0.6 mg/dL (ref 0.3–1.2)
BUN: 24 mg/dL — ABNORMAL HIGH (ref 6–20)
CALCIUM: 9.3 mg/dL (ref 8.9–10.3)
CO2: 28 mmol/L (ref 22–32)
CREATININE: 1.06 mg/dL (ref 0.61–1.24)
Chloride: 104 mmol/L (ref 101–111)
Glucose, Bld: 120 mg/dL — ABNORMAL HIGH (ref 65–99)
Potassium: 4.2 mmol/L (ref 3.5–5.1)
Sodium: 139 mmol/L (ref 135–145)
Total Protein: 7.5 g/dL (ref 6.5–8.1)

## 2016-11-27 LAB — CBC WITH DIFFERENTIAL/PLATELET
Basophils Absolute: 0 10*3/uL (ref 0–0.1)
Basophils Relative: 1 %
Eosinophils Absolute: 0.5 10*3/uL (ref 0–0.7)
Eosinophils Relative: 9 %
HCT: 41.3 % (ref 40.0–52.0)
HEMOGLOBIN: 14.3 g/dL (ref 13.0–18.0)
LYMPHS ABS: 1.4 10*3/uL (ref 1.0–3.6)
Lymphocytes Relative: 26 %
MCH: 32.2 pg (ref 26.0–34.0)
MCHC: 34.7 g/dL (ref 32.0–36.0)
MCV: 92.8 fL (ref 80.0–100.0)
MONOS PCT: 17 %
Monocytes Absolute: 0.9 10*3/uL (ref 0.2–1.0)
NEUTROS ABS: 2.6 10*3/uL (ref 1.4–6.5)
Neutrophils Relative %: 47 %
Platelets: 178 10*3/uL (ref 150–440)
RBC: 4.45 MIL/uL (ref 4.40–5.90)
RDW: 13.2 % (ref 11.5–14.5)
WBC: 5.5 10*3/uL (ref 3.8–10.6)

## 2016-11-27 LAB — LACTATE DEHYDROGENASE: LDH: 164 U/L (ref 98–192)

## 2016-11-27 NOTE — Progress Notes (Signed)
Patient does not offer any problems today.  

## 2016-12-18 DIAGNOSIS — H401131 Primary open-angle glaucoma, bilateral, mild stage: Secondary | ICD-10-CM | POA: Diagnosis not present

## 2017-01-01 ENCOUNTER — Encounter: Payer: Self-pay | Admitting: *Deleted

## 2017-01-04 ENCOUNTER — Encounter
Admission: RE | Disposition: A | Discharge status: Home / Self Care | Payer: Self-pay | Source: Ambulatory Visit | Attending: Gastroenterology

## 2017-01-04 ENCOUNTER — Ambulatory Visit: Payer: PPO | Admitting: Anesthesiology

## 2017-01-04 ENCOUNTER — Encounter: Payer: Self-pay | Admitting: *Deleted

## 2017-01-04 ENCOUNTER — Ambulatory Visit
Admission: RE | Admit: 2017-01-04 | Discharge: 2017-01-04 | Disposition: A | Discharge status: Home / Self Care | Payer: PPO | Source: Ambulatory Visit | Attending: Gastroenterology | Admitting: Gastroenterology

## 2017-01-04 DIAGNOSIS — H409 Unspecified glaucoma: Secondary | ICD-10-CM | POA: Diagnosis not present

## 2017-01-04 DIAGNOSIS — Z7982 Long term (current) use of aspirin: Secondary | ICD-10-CM | POA: Insufficient documentation

## 2017-01-04 DIAGNOSIS — Z1211 Encounter for screening for malignant neoplasm of colon: Secondary | ICD-10-CM | POA: Insufficient documentation

## 2017-01-04 DIAGNOSIS — Z79899 Other long term (current) drug therapy: Secondary | ICD-10-CM | POA: Diagnosis not present

## 2017-01-04 DIAGNOSIS — R3911 Hesitancy of micturition: Secondary | ICD-10-CM | POA: Diagnosis not present

## 2017-01-04 DIAGNOSIS — K648 Other hemorrhoids: Secondary | ICD-10-CM | POA: Diagnosis not present

## 2017-01-04 DIAGNOSIS — N401 Enlarged prostate with lower urinary tract symptoms: Secondary | ICD-10-CM | POA: Diagnosis not present

## 2017-01-04 DIAGNOSIS — K635 Polyp of colon: Secondary | ICD-10-CM | POA: Insufficient documentation

## 2017-01-04 DIAGNOSIS — K5790 Diverticulosis of intestine, part unspecified, without perforation or abscess without bleeding: Secondary | ICD-10-CM | POA: Diagnosis not present

## 2017-01-04 DIAGNOSIS — D128 Benign neoplasm of rectum: Secondary | ICD-10-CM | POA: Diagnosis not present

## 2017-01-04 DIAGNOSIS — F17290 Nicotine dependence, other tobacco product, uncomplicated: Secondary | ICD-10-CM | POA: Insufficient documentation

## 2017-01-04 DIAGNOSIS — Z8572 Personal history of non-Hodgkin lymphomas: Secondary | ICD-10-CM | POA: Insufficient documentation

## 2017-01-04 DIAGNOSIS — K621 Rectal polyp: Secondary | ICD-10-CM | POA: Diagnosis not present

## 2017-01-04 DIAGNOSIS — D125 Benign neoplasm of sigmoid colon: Secondary | ICD-10-CM | POA: Diagnosis not present

## 2017-01-04 DIAGNOSIS — R351 Nocturia: Secondary | ICD-10-CM | POA: Insufficient documentation

## 2017-01-04 DIAGNOSIS — E669 Obesity, unspecified: Secondary | ICD-10-CM | POA: Insufficient documentation

## 2017-01-04 DIAGNOSIS — I10 Essential (primary) hypertension: Secondary | ICD-10-CM | POA: Diagnosis not present

## 2017-01-04 DIAGNOSIS — K5730 Diverticulosis of large intestine without perforation or abscess without bleeding: Secondary | ICD-10-CM | POA: Insufficient documentation

## 2017-01-04 DIAGNOSIS — Z86010 Personal history of colonic polyps: Secondary | ICD-10-CM | POA: Insufficient documentation

## 2017-01-04 HISTORY — PX: COLONOSCOPY WITH PROPOFOL: SHX5780

## 2017-01-04 HISTORY — DX: Other fatigue: R53.83

## 2017-01-04 SURGERY — COLONOSCOPY WITH PROPOFOL
Anesthesia: General

## 2017-01-04 MED ORDER — SODIUM CHLORIDE 0.9 % IV SOLN
INTRAVENOUS | Status: DC
  Administered 2017-01-04: 1000 mL via INTRAVENOUS
  Administered 2017-01-04: 14:00:00 via INTRAVENOUS

## 2017-01-04 MED ORDER — LIDOCAINE HCL (PF) 1 % IJ SOLN
INTRAMUSCULAR | Status: AC
  Administered 2017-01-04: 0.3 mL via INTRADERMAL
  Filled 2017-01-04: qty 2

## 2017-01-04 MED ORDER — SODIUM CHLORIDE 0.9 % IV SOLN: INTRAVENOUS | Status: DC

## 2017-01-04 MED ORDER — FENTANYL CITRATE (PF) 100 MCG/2ML IJ SOLN
INTRAMUSCULAR | Status: AC
  Filled 2017-01-04: qty 2

## 2017-01-04 MED ORDER — LIDOCAINE 2% (20 MG/ML) 5 ML SYRINGE
INTRAMUSCULAR | Status: DC | PRN
Start: 2017-01-04 — End: 2017-01-04
  Administered 2017-01-04: 40 mg via INTRAVENOUS

## 2017-01-04 MED ORDER — PROPOFOL 500 MG/50ML IV EMUL
INTRAVENOUS | Status: DC | PRN
  Administered 2017-01-04: 160 ug/kg/min via INTRAVENOUS

## 2017-01-04 MED ORDER — FENTANYL CITRATE (PF) 100 MCG/2ML IJ SOLN
INTRAMUSCULAR | Status: DC | PRN
  Administered 2017-01-04 (×2): 25 ug via INTRAVENOUS
  Administered 2017-01-04: 50 ug via INTRAVENOUS

## 2017-01-04 MED ORDER — PROPOFOL 500 MG/50ML IV EMUL
INTRAVENOUS | Status: AC
  Filled 2017-01-04: qty 50

## 2017-01-04 MED ORDER — PROPOFOL 10 MG/ML IV BOLUS
INTRAVENOUS | Status: DC | PRN
  Administered 2017-01-04: 100 mg via INTRAVENOUS

## 2017-01-04 MED ORDER — LIDOCAINE HCL (PF) 1 % IJ SOLN
2.0000 mL | Freq: Once | INTRAMUSCULAR | Status: AC
  Administered 2017-01-04: 0.3 mL via INTRADERMAL

## 2017-01-04 NOTE — H&P (Signed)
Outpatient short stay form Pre-procedure 01/04/2017 1:49 PM Lollie Sails MD  Primary Physician: Dr. Genene Churn  Reason for visit:  colonoscopy  History of present illness:  Personal history of adenomatous colon polyps. Patient tolerated his prep well. He takes no blood thinning agents with the exception of a 81 mg aspirin that he discontinued for today.    Current Facility-Administered Medications:  .  0.9 %  sodium chloride infusion, , Intravenous, Continuous, Lollie Sails, MD .  0.9 %  sodium chloride infusion, , Intravenous, Continuous, Lollie Sails, MD, Last Rate: 20 mL/hr at 01/04/17 1246 .  0.9 %  sodium chloride infusion, , Intravenous, Continuous, Lollie Sails, MD  Facility-Administered Medications Ordered in Other Encounters:  .  fentaNYL (SUBLIMAZE) injection, , Intravenous, Anesthesia Intra-op, Courtney Paris, CRNA, 50 mcg at 01/04/17 1347  Prescriptions Prior to Admission  Medication Sig Dispense Refill Last Dose  . ibuprofen (ADVIL,MOTRIN) 200 MG tablet Take 200 mg by mouth every 6 (six) hours as needed.     Marland Kitchen aspirin EC 81 MG tablet Take 81 mg by mouth daily.   01/02/2017  . finasteride (PROSCAR) 5 MG tablet Take 1 tablet (5 mg total) by mouth daily. 90 tablet 3 Taking  . latanoprost (XALATAN) 0.005 % ophthalmic solution Place 1 drop into both eyes every morning.    Taking  . lisinopril-hydrochlorothiazide (PRINZIDE,ZESTORETIC) 10-12.5 MG tablet Take 1 tablet by mouth daily. 30 tablet 0 Taking  . Omega-3 Fatty Acids (FISH OIL) 1200 MG CAPS Take 1,200-2,400 mg by mouth See admin instructions. Take 2 capsules (2400mg ) by mouth every morning and 1 capsule by mouth in the evening.   Taking  . timolol (TIMOPTIC) 0.5 % ophthalmic solution Apply 1 drop to eye every morning.    Taking     No Known Allergies   Past Medical History:  Diagnosis Date  . Acid reflux 06/03/2015  . Adenomatous colon polyp 08/12/2014   Overview:  On 05/25/2011 colonoscopy;  repeat 05/2016 (Dr. Gustavo Lah)   . BPH (benign prostatic hyperplasia)   . Cancer ([place])    LYMPHOMA  . Dribbling   . ED (erectile dysfunction)   . Essential (primary) hypertension 06/03/2015  . Fatigue   . GERD (gastroesophageal reflux disease)   . Glaucoma 08/07/2013  . Hesitancy   . HTN (hypertension)   . Hypertension   . Hypogonadism in male   . Nocturia   . Obese   . Urgency-frequency syndrome     Review of systems:      Physical Exam    Heart and lungs: regular rate and rhythm without rub or gallop, lungs are bilaterally clear    HEENT: normocephalic atraumatic eyes are anicteric    Other:     Pertinant exam for procedure: soft nontender nondistended bowel sounds positive and normoactive    Planned proceedures: colonoscopy and indicated procedures. I have discussed the risks benefits and complications of procedures to include not limited to bleeding, infection, perforation and the risk of sedation and the patient wishes to proceed.    Lollie Sails, MD Gastroenterology 01/04/2017  1:49 PM

## 2017-01-04 NOTE — Anesthesia Post-op Follow-up Note (Signed)
Anesthesia QCDR form completed.        

## 2017-01-04 NOTE — Transfer of Care (Signed)
Immediate Anesthesia Transfer of Care Note  Patient: Zachary Robertson  Procedure(s) Performed: COLONOSCOPY WITH PROPOFOL (N/A )  Patient Location: PACU and Endoscopy Unit  Anesthesia Type:General  Level of Consciousness: awake and patient cooperative  Airway & Oxygen Therapy: Patient Spontanous Breathing and Patient connected to nasal cannula oxygen  Post-op Assessment: Report given to RN and Post -op Vital signs reviewed and stable  Post vital signs: Reviewed and stable  Last Vitals:  Vitals:   01/04/17 1225  BP: (!) 147/76  Pulse: 74  Resp: 18  Temp: 36.7 C  SpO2: 97%    Last Pain:  Vitals:   01/04/17 1225  TempSrc: Tympanic         Complications: No apparent anesthesia complications

## 2017-01-04 NOTE — Op Note (Signed)
[facility] Patient Name: Zachary Robertson Procedure Date: 01/04/2017 1:40 PM MRN: [id number] Account #: [id number] Date of Birth: Nov 05, 1940 Admit Type: Outpatient Age: 76 Room: [facility] ENDO ROOM 1 Gender: Male Note Status: Finalized Procedure:            Colonoscopy Indications:          Personal history of colonic polyps Providers:            Lollie Sails, MD Referring MD:         Christena Flake. Raechel Ache, MD (Referring MD) Medicines:            Monitored Anesthesia Care Complications:        No immediate complications. Procedure:            Pre-Anesthesia Assessment:                       - ASA Grade Assessment: III - A patient with severe                        systemic disease.                       After obtaining informed consent, the colonoscope was                        passed under direct vision. Throughout the procedure,                        the patient's blood pressure, pulse, and oxygen                        saturations were monitored continuously. The Olympus                        PCF-H180AL colonoscope ( S#: [id number] ) was introduced                        through the anus and advanced to the the cecum,                        identified by appendiceal orifice and ileocecal valve.                        The quality of the bowel preparation was good except                        the ascending colon was poor. The patient tolerated the                        procedure well. Findings:      Multiple medium-mouthed diverticula were found in the sigmoid colon.      A 3 mm polyp was found in the distal sigmoid colon. The polyp was       sessile. The polyp was removed with a cold biopsy forceps. Resection and       retrieval were complete.      A 3 mm polyp was found in the rectum. The polyp was sessile. The polyp       was removed with a cold biopsy forceps. Resection and retrieval were       complete.  The retroflexed view of the distal  rectum and anal verge was normal and       showed no anal or rectal abnormalities.      The exam was otherwise without abnormality.      The ileocecal valve was turned open but could not be fully intubated due       to angulation, normal in appearance. Impression:           - Diverticulosis in the sigmoid colon.                       - One 3 mm polyp in the distal sigmoid colon, removed                        with a cold biopsy forceps. Resected and retrieved.                       - One 3 mm polyp in the rectum, removed with a cold                        biopsy forceps. Resected and retrieved.                       - The distal rectum and anal verge are normal on                        retroflexion view.                       - The examination was otherwise normal. Recommendation:       - Discharge patient to home.                       - Soft diet today, then advance as tolerated to advance                        diet as tolerated.                       - Telephone GI clinic for pathology results in 1 week. Procedure Code(s):    --- Professional ---                       [phone], Colonoscopy, flexible; with biopsy, single or                        multiple Diagnosis Code(s):    --- Professional ---                       D12.5, Benign neoplasm of sigmoid colon                       K62.1, Rectal polyp                       Z86.010, Personal history of colonic polyps                       K57.30, Diverticulosis of large intestine without                        perforation or abscess  without bleeding CPT copyright 2016 American Medical Association. All rights reserved. The codes documented in this report are preliminary and upon coder review may  be revised to meet current compliance requirements. Lollie Sails, MD 01/04/2017 2:26:52 PM This report has been signed electronically. Number of Addenda: 0 Note Initiated On: 01/04/2017 1:40 PM Scope Withdrawal Time: 0 hours 11 minutes 42  seconds  Total Procedure Duration: 0 hours 23 minutes 46 seconds       [facility]

## 2017-01-04 NOTE — Anesthesia Preprocedure Evaluation (Signed)
Anesthesia Evaluation  Patient identified by MRN, date of birth, ID band Patient awake    Reviewed: Allergy & Precautions, NPO status , Patient's Chart, lab work & pertinent test results  History of Anesthesia Complications Negative for: history of anesthetic complications  Airway Mallampati: II  TM Distance: >3 FB Neck ROM: Full    Dental no notable dental hx.    Pulmonary neg sleep apnea, neg COPD, Current Smoker (cigars),    breath sounds clear to auscultation- rhonchi (-) wheezing      Cardiovascular hypertension, Pt. on medications (-) CAD, (-) Past MI and (-) Cardiac Stents  Rhythm:Regular Rate:Normal - Systolic murmurs and - Diastolic murmurs    Neuro/Psych negative neurological ROS  negative psych ROS   GI/Hepatic Neg liver ROS, GERD  ,  Endo/Other  negative endocrine ROSneg diabetes  Renal/GU negative Renal ROS     Musculoskeletal  (+) Arthritis ,   Abdominal (+) + obese,   Peds  Hematology negative hematology ROS (+)   Anesthesia Other Findings Past Medical History: 06/03/2015: Acid reflux 08/12/2014: Adenomatous colon polyp     Comment:  Overview:  On 05/25/2011 colonoscopy; repeat 05/2016 (Dr.               Gustavo Lah)  No date: BPH (benign prostatic hyperplasia) No date: Cancer [facility])     Comment:  LYMPHOMA No date: Dribbling No date: ED (erectile dysfunction) 06/03/2015: Essential (primary) hypertension No date: Fatigue No date: GERD (gastroesophageal reflux disease) 08/07/2013: Glaucoma No date: Hesitancy No date: HTN (hypertension) No date: Hypertension No date: Hypogonadism in male No date: Nocturia No date: Obese No date: Urgency-frequency syndrome   Reproductive/Obstetrics                             Anesthesia Physical Anesthesia Plan  ASA: II  Anesthesia Plan: General   Post-op Pain Management:    Induction: Intravenous  PONV Risk Score and Plan: 0 and  Propofol infusion  Airway Management Planned: Natural Airway  Additional Equipment:   Intra-op Plan:   Post-operative Plan:   Informed Consent: I have reviewed the patients History and Physical, chart, labs and discussed the procedure including the risks, benefits and alternatives for the proposed anesthesia with the patient or authorized representative who has indicated his/her understanding and acceptance.   Dental advisory given  Plan Discussed with: CRNA and Anesthesiologist  Anesthesia Plan Comments:         Anesthesia Quick Evaluation

## 2017-01-05 ENCOUNTER — Encounter: Payer: Self-pay | Admitting: Gastroenterology

## 2017-01-05 NOTE — Anesthesia Postprocedure Evaluation (Signed)
Anesthesia Post Note  Patient: Zachary Robertson  Procedure(s) Performed: COLONOSCOPY WITH PROPOFOL (N/A )  Patient location during evaluation: PACU Anesthesia Type: General Level of consciousness: awake Pain management: pain level controlled Vital Signs Assessment: post-procedure vital signs reviewed and stable Respiratory status: spontaneous breathing Cardiovascular status: stable Anesthetic complications: no     Last Vitals:  Vitals:   01/04/17 1449 01/04/17 1459  BP: (!) 143/82 (!) 143/75  Pulse: 60 63  Resp: 15 16  Temp:    SpO2: 100% 100%    Last Pain:  Vitals:   01/05/17 0755  TempSrc:   PainSc: 0-No pain                 VAN STAVEREN,Finlee Concepcion

## 2017-01-07 LAB — SURGICAL PATHOLOGY

## 2017-02-17 DIAGNOSIS — Z23 Encounter for immunization: Secondary | ICD-10-CM | POA: Diagnosis not present

## 2017-02-17 DIAGNOSIS — E782 Mixed hyperlipidemia: Secondary | ICD-10-CM | POA: Diagnosis not present

## 2017-02-17 DIAGNOSIS — R351 Nocturia: Secondary | ICD-10-CM | POA: Diagnosis not present

## 2017-02-17 DIAGNOSIS — R7302 Impaired glucose tolerance (oral): Secondary | ICD-10-CM | POA: Diagnosis not present

## 2017-02-17 DIAGNOSIS — I700 Atherosclerosis of aorta: Secondary | ICD-10-CM | POA: Diagnosis not present

## 2017-02-17 DIAGNOSIS — I10 Essential (primary) hypertension: Secondary | ICD-10-CM | POA: Diagnosis not present

## 2017-02-17 DIAGNOSIS — E559 Vitamin D deficiency, unspecified: Secondary | ICD-10-CM | POA: Diagnosis not present

## 2017-02-17 DIAGNOSIS — M79675 Pain in left toe(s): Secondary | ICD-10-CM | POA: Diagnosis not present

## 2017-02-17 DIAGNOSIS — Z79899 Other long term (current) drug therapy: Secondary | ICD-10-CM | POA: Diagnosis not present

## 2017-02-17 DIAGNOSIS — N401 Enlarged prostate with lower urinary tract symptoms: Secondary | ICD-10-CM | POA: Diagnosis not present

## 2017-02-17 DIAGNOSIS — Z0000 Encounter for general adult medical examination without abnormal findings: Secondary | ICD-10-CM | POA: Diagnosis not present

## 2017-05-18 ENCOUNTER — Telehealth: Payer: Self-pay | Admitting: *Deleted

## 2017-05-18 DIAGNOSIS — Z01812 Encounter for preprocedural laboratory examination: Secondary | ICD-10-CM

## 2017-05-18 NOTE — Telephone Encounter (Signed)
Patient needs to have labs prior to CT, I see an appointment for labs, but no labs are entered. What labs do you want, More than just a Creat?

## 2017-05-18 NOTE — Telephone Encounter (Signed)
Cbc, metc.

## 2017-05-20 DIAGNOSIS — E782 Mixed hyperlipidemia: Secondary | ICD-10-CM | POA: Diagnosis not present

## 2017-05-20 DIAGNOSIS — Z79899 Other long term (current) drug therapy: Secondary | ICD-10-CM | POA: Diagnosis not present

## 2017-05-28 ENCOUNTER — Inpatient Hospital Stay: Payer: PPO

## 2017-05-28 ENCOUNTER — Ambulatory Visit: Admission: RE | Admit: 2017-05-28 | Payer: PPO | Source: Ambulatory Visit

## 2017-05-30 NOTE — Progress Notes (Signed)
.  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 He currently feels well and is asymptomatic. He remains active and golfing several times a week. Patient denies any weight loss, fevers, chills, night sweats,  He denies any abdominal pain or changes in his bowel movements. He has no nausea, vomiting, constipation, or diarrhea.  He denies any chest pain or shortness of breath.  He has no neurological complaints. He has no urinary complaints. Patient offers no specific complaints today.  REVIEW OF SYSTEMS:   Review of Systems  Constitutional: Negative for chills, fever, malaise/fatigue and weight loss.  HENT: Negative for congestion and sore throat.   Respiratory: Negative.  Negative for cough, hemoptysis and shortness of breath.   Cardiovascular: Negative.  Negative for chest pain.  Gastrointestinal: Negative.  Negative for abdominal pain, blood in stool, constipation, diarrhea, melena, nausea and vomiting.  Genitourinary: Negative.   Musculoskeletal: Negative.   Skin: Negative.  Negative for rash.  Neurological: Negative.  Negative for dizziness, weakness and headaches.  Endo/Heme/Allergies: Does not bruise/bleed easily.  Psychiatric/Behavioral: Negative.  The patient is not nervous/anxious and does not have insomnia.     As per HPI. Otherwise, a complete review of systems is negative.  PAST MEDICAL HISTORY: Past Medical History:  Diagnosis Date  . Acid reflux 06/03/2015  . Adenomatous colon polyp 08/12/2014   Overview:  On 05/25/2011 colonoscopy; repeat 05/2016 (Dr. Gustavo Lah)   . BPH (benign prostatic  hyperplasia)   . Cancer ([place])    LYMPHOMA  . Dribbling   . ED (erectile dysfunction)   . Essential (primary) hypertension 06/03/2015  . Fatigue   . GERD (gastroesophageal reflux disease)   . Glaucoma 08/07/2013  . Hesitancy   . HTN (hypertension)   . Hypertension   . Hypogonadism in male   . Nocturia   . Obese   . Urgency-frequency syndrome     PAST SURGICAL HISTORY: Past Surgical History:  Procedure Laterality Date  . BOWEL RESECTION  09/18/2015   Procedure: SMALL BOWEL RESECTION;  Surgeon: Clayburn Pert, MD;  Location: [facility] ORS;  Service: General;;  with anastomosis  . BOWEL RESECTION    . COLONOSCOPY WITH PROPOFOL N/A 01/04/2017   Procedure: COLONOSCOPY WITH PROPOFOL;  Surgeon: Lollie Sails, MD;  Location: [facility] ENDOSCOPY;  Service: Endoscopy;  Laterality: N/A;  . HERNIA REPAIR    . LAPAROSCOPY  09/18/2015   Procedure: LAPAROSCOPY DIAGNOSTIC;  Surgeon: Clayburn Pert, MD;  Location: [facility] ORS;  Service: General;;  . LAPAROTOMY N/A 09/18/2015   Procedure: EXPLORATORY LAPAROTOMY;  Surgeon: Clayburn Pert, MD;  Location: [facility] ORS;  Service: General;  Laterality: N/A;    FAMILY HISTORY: Family History  Problem Relation Age of Onset  . Prostate cancer Father   . Cancer Father   . Cancer Unknown        family members in general  . Cancer Sister   . Clotting disorder Sister   . Cancer Brother   . Bladder Cancer Neg Hx   . Kidney cancer Neg Hx        ADVANCED DIRECTIVES:    HEALTH MAINTENANCE: Social History   Tobacco Use  . Smoking status: Current Some  Day Smoker    Types: Cigars    Last attempt to quit: 04/10/2015    Years since quitting: 2.1  . Smokeless tobacco: Never Used  . Tobacco comment: occasional cigar  Substance Use Topics  . Alcohol use: Yes    Alcohol/week: 0.0 oz    Comment: 1-2 drinks of Liquor Daily.  . Drug use: No     Colonoscopy:  PAP:  Bone density:  Lipid panel:  No Known Allergies  Current Outpatient Medications    Medication Sig Dispense Refill  . aspirin EC 81 MG tablet Take 81 mg by mouth daily.    Marland Kitchen latanoprost (XALATAN) 0.005 % ophthalmic solution Place 1 drop into both eyes every morning.     . Omega-3 Fatty Acids (FISH OIL) 1200 MG CAPS Take 1,200-2,400 mg by mouth See admin instructions. Take 2 capsules (2400mg ) by mouth every morning and 1 capsule by mouth in the evening.    . timolol (TIMOPTIC) 0.5 % ophthalmic solution Apply 1 drop to eye every morning.     . finasteride (PROSCAR) 5 MG tablet TAKE ONE TABLET BY MOUTH DAILY 30 tablet 0  . ibuprofen (ADVIL,MOTRIN) 200 MG tablet Take 200 mg by mouth every 6 (six) hours as needed.    Marland Kitchen lisinopril-hydrochlorothiazide (PRINZIDE,ZESTORETIC) 10-12.5 MG tablet Take 1 tablet by mouth daily. 30 tablet 0   No current facility-administered medications for this visit.     OBJECTIVE: Vitals:   06/04/17 0835  BP: (!) 150/83  Pulse: 73  Resp: 18  Temp: (!) 97.3 F (36.3 C)     Body mass index is 37.98 kg/m.    ECOG FS:0 - Asymptomatic  General: Well-developed, well-nourished, no acute distress. Eyes: Pink conjunctiva, anicteric sclera. Lungs: Clear to auscultation bilaterally. Heart: Regular rate and rhythm. No rubs, murmurs, or gallops. Abdomen: Soft, nontender, nondistended. No organomegaly noted, normoactive bowel sounds. Musculoskeletal: No edema, cyanosis, or clubbing. Neuro: Alert, answering all questions appropriately. Cranial nerves grossly intact. Skin: No rashes or petechiae noted. Psych: Normal affect. Lymphatics: No cervical, calvicular, axillary or inguinal LAD.   LAB RESULTS:  Lab Results  Component Value Date   NA 138 06/01/2017   K 4.2 06/01/2017   CL 103 06/01/2017   CO2 27 06/01/2017   GLUCOSE 117 (H) 06/01/2017   BUN 18 06/01/2017   CREATININE 1.04 06/01/2017   CALCIUM 9.4 06/01/2017   PROT 7.7 06/01/2017   ALBUMIN 4.2 06/01/2017   AST 25 06/01/2017   ALT 25 06/01/2017   ALKPHOS 94 06/01/2017   BILITOT 0.6  06/01/2017   GFRNONAA >60 06/01/2017   GFRAA >60 06/01/2017    Lab Results  Component Value Date   WBC 7.7 06/01/2017   NEUTROABS 4.4 06/01/2017   HGB 14.6 06/01/2017   HCT 42.4 06/01/2017   MCV 91.3 06/01/2017   PLT 172 06/01/2017     STUDIES: Ct Abdomen Pelvis W Contrast  Result Date: 06/01/2017 CLINICAL DATA:  Followup diffuse large B-cell lymphoma. EXAM: CT ABDOMEN AND PELVIS WITH CONTRAST TECHNIQUE: Multidetector CT imaging of the abdomen and pelvis was performed using the standard protocol following bolus administration of intravenous contrast. CONTRAST:  197mL ISOVUE-300 IOPAMIDOL (ISOVUE-300) INJECTION 61% COMPARISON:  PET-CT on 11/24/2016 FINDINGS: Lower Chest: No acute findings. Hepatobiliary: No hepatic masses identified. Gallbladder is unremarkable. Pancreas:  No mass or inflammatory changes. Spleen: Within normal limits in size and appearance. Adrenals/Urinary Tract: No masses identified. Small bilateral renal cysts. No evidence of hydronephrosis. Unremarkable unopacified urinary bladder. Stomach/Bowel: No evidence of obstruction,  inflammatory process or abnormal fluid collections. Diverticulosis of the descending and sigmoid colon seen, without evidence of diverticulitis. Vascular/Lymphatic: No pathologically enlarged lymph nodes. No abdominal aortic aneurysm. Aortic atherosclerosis. Reproductive:  No mass or other significant abnormality. Other:  None. Musculoskeletal:  No suspicious bone lesions identified. IMPRESSION: No evidence of recurrent lymphoma or other acute findings. Electronically Signed   By: Earle Gell M.D.   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 Because of the extensive extranodal involvement of the terminal illium, this is  considered stage IV disease.  CT scan results from June 01, 2017 reviewed independently and reported as above with no evidence of recurrent or progressive disease. Patient also had a colonoscopy on January 04, 2017 that removed several benign polyps but otherwise was reported as normal.  He completed his chemotherapy on March 13, 2016. Will continue imaging every 6 months until he is 2 years removed from treatment and then proceed with yearly imaging for 5 years. Return to clinic in 6 months with repeat CT scan and further evaluation at which point he can be switched to yearly evaluation. 2.  Hypertension: Patient's blood pressure is mildly elevated today.  Continue current medications and follow-up with primary care.   Patient expressed understanding and was in agreement with this plan. He also understands that He can call clinic at any time with any questions, concerns, or complaints.   Cancer Staging Diffuse large B cell lymphoma ([place]) Staging form: Lymphoid Neoplasms, AJCC 6th Edition - Clinical stage from 10/09/2015: Stage IV - Signed by Lloyd Huger, MD on 10/09/2015   Lloyd Huger, MD   06/04/2017 10:16 AM

## 2017-06-01 ENCOUNTER — Inpatient Hospital Stay: Payer: PPO | Attending: Oncology

## 2017-06-01 ENCOUNTER — Ambulatory Visit
Admission: RE | Admit: 2017-06-01 | Discharge: 2017-06-01 | Disposition: A | Discharge status: Home / Self Care | Payer: PPO | Source: Ambulatory Visit | Attending: Oncology | Admitting: Oncology

## 2017-06-01 DIAGNOSIS — I10 Essential (primary) hypertension: Secondary | ICD-10-CM | POA: Diagnosis not present

## 2017-06-01 DIAGNOSIS — N400 Enlarged prostate without lower urinary tract symptoms: Secondary | ICD-10-CM | POA: Insufficient documentation

## 2017-06-01 DIAGNOSIS — E669 Obesity, unspecified: Secondary | ICD-10-CM | POA: Insufficient documentation

## 2017-06-01 DIAGNOSIS — Z01812 Encounter for preprocedural laboratory examination: Secondary | ICD-10-CM

## 2017-06-01 DIAGNOSIS — C8339 Diffuse large B-cell lymphoma, extranodal and solid organ sites: Secondary | ICD-10-CM | POA: Insufficient documentation

## 2017-06-01 DIAGNOSIS — C8330 Diffuse large B-cell lymphoma, unspecified site: Secondary | ICD-10-CM | POA: Diagnosis not present

## 2017-06-01 DIAGNOSIS — Z7982 Long term (current) use of aspirin: Secondary | ICD-10-CM | POA: Diagnosis not present

## 2017-06-01 DIAGNOSIS — K219 Gastro-esophageal reflux disease without esophagitis: Secondary | ICD-10-CM | POA: Diagnosis not present

## 2017-06-01 DIAGNOSIS — H409 Unspecified glaucoma: Secondary | ICD-10-CM | POA: Insufficient documentation

## 2017-06-01 DIAGNOSIS — Z79899 Other long term (current) drug therapy: Secondary | ICD-10-CM | POA: Diagnosis not present

## 2017-06-01 LAB — COMPREHENSIVE METABOLIC PANEL
ALK PHOS: 94 U/L (ref 38–126)
ALT: 25 U/L (ref 17–63)
ANION GAP: 8 (ref 5–15)
AST: 25 U/L (ref 15–41)
Albumin: 4.2 g/dL (ref 3.5–5.0)
BUN: 18 mg/dL (ref 6–20)
CALCIUM: 9.4 mg/dL (ref 8.9–10.3)
CO2: 27 mmol/L (ref 22–32)
Chloride: 103 mmol/L (ref 101–111)
Creatinine, Ser: 1.04 mg/dL (ref 0.61–1.24)
GFR calc Af Amer: >60 mL/min (ref >60)
GFR calc non Af Amer: >60 mL/min (ref >60)
GLUCOSE: 117 mg/dL — AB (ref 65–99)
POTASSIUM: 4.2 mmol/L (ref 3.5–5.1)
SODIUM: 138 mmol/L (ref 135–145)
Total Bilirubin: 0.6 mg/dL (ref 0.3–1.2)
Total Protein: 7.7 g/dL (ref 6.5–8.1)

## 2017-06-01 LAB — CBC WITH DIFFERENTIAL/PLATELET
Basophils Absolute: 0.1 10*3/uL (ref 0–0.1)
Basophils Relative: 1 %
Eosinophils Absolute: 0.5 10*3/uL (ref 0–0.7)
Eosinophils Relative: 6 %
HEMATOCRIT: 42.4 % (ref 40.0–52.0)
Hemoglobin: 14.6 g/dL (ref 13.0–18.0)
LYMPHS PCT: 23 %
Lymphs Abs: 1.8 10*3/uL (ref 1.0–3.6)
MCH: 31.6 pg (ref 26.0–34.0)
MCHC: 34.5 g/dL (ref 32.0–36.0)
MCV: 91.3 fL (ref 80.0–100.0)
MONO ABS: 0.9 10*3/uL (ref 0.2–1.0)
Monocytes Relative: 12 %
NEUTROS ABS: 4.4 10*3/uL (ref 1.4–6.5)
Neutrophils Relative %: 58 %
Platelets: 172 10*3/uL (ref 150–440)
RBC: 4.64 MIL/uL (ref 4.40–5.90)
RDW: 13.4 % (ref 11.5–14.5)
WBC: 7.7 10*3/uL (ref 3.8–10.6)

## 2017-06-01 MED ORDER — IOPAMIDOL (ISOVUE-300) INJECTION 61%
125.0000 mL | Freq: Once | INTRAVENOUS | Status: AC | PRN
  Administered 2017-06-01: 125 mL via INTRAVENOUS

## 2017-06-03 ENCOUNTER — Other Ambulatory Visit: Payer: Self-pay | Admitting: Urology

## 2017-06-03 DIAGNOSIS — N401 Enlarged prostate with lower urinary tract symptoms: Principal | ICD-10-CM

## 2017-06-03 DIAGNOSIS — N138 Other obstructive and reflux uropathy: Secondary | ICD-10-CM

## 2017-06-04 ENCOUNTER — Inpatient Hospital Stay (HOSPITAL_BASED_OUTPATIENT_CLINIC_OR_DEPARTMENT_OTHER): Payer: PPO | Admitting: Oncology

## 2017-06-04 ENCOUNTER — Encounter: Payer: Self-pay | Admitting: Oncology

## 2017-06-04 VITALS — BP 150/83 | HR 73 | Temp 97.3°F | Resp 18 | Ht 70.0 in | Wt 264.7 lb

## 2017-06-04 DIAGNOSIS — C8339 Diffuse large B-cell lymphoma, extranodal and solid organ sites: Secondary | ICD-10-CM | POA: Diagnosis not present

## 2017-06-04 DIAGNOSIS — Z79899 Other long term (current) drug therapy: Secondary | ICD-10-CM

## 2017-06-04 NOTE — Progress Notes (Signed)
No new changes noted today 

## 2017-07-07 ENCOUNTER — Telehealth: Payer: Self-pay | Admitting: Urology

## 2017-07-07 ENCOUNTER — Other Ambulatory Visit: Payer: Self-pay | Admitting: Urology

## 2017-07-07 DIAGNOSIS — N401 Enlarged prostate with lower urinary tract symptoms: Principal | ICD-10-CM

## 2017-07-07 DIAGNOSIS — N138 Other obstructive and reflux uropathy: Secondary | ICD-10-CM

## 2017-07-07 MED ORDER — FINASTERIDE 5 MG PO TABS: 5.0000 mg | ORAL_TABLET | Freq: Every day | ORAL | 0 refills | Status: DC

## 2017-07-07 NOTE — Telephone Encounter (Signed)
We had to move his app out since you are out of the office and he is total out of his finasteride, can we call him in a refill until his app on 07-27-17? He uses the [facility]

## 2017-07-07 NOTE — Progress Notes (Signed)
I have sent a refill in for the finasteride to [facility].

## 2017-07-08 ENCOUNTER — Other Ambulatory Visit: Payer: Self-pay | Admitting: Urology

## 2017-07-08 ENCOUNTER — Ambulatory Visit: Payer: PPO | Admitting: Urology

## 2017-07-08 DIAGNOSIS — N138 Other obstructive and reflux uropathy: Secondary | ICD-10-CM

## 2017-07-08 DIAGNOSIS — N401 Enlarged prostate with lower urinary tract symptoms: Principal | ICD-10-CM

## 2017-07-27 ENCOUNTER — Ambulatory Visit: Payer: PPO | Admitting: Urology

## 2017-07-29 ENCOUNTER — Ambulatory Visit: Payer: PPO | Admitting: Urology

## 2017-07-29 DIAGNOSIS — H401131 Primary open-angle glaucoma, bilateral, mild stage: Secondary | ICD-10-CM | POA: Diagnosis not present

## 2017-08-18 DIAGNOSIS — R351 Nocturia: Secondary | ICD-10-CM | POA: Diagnosis not present

## 2017-08-18 DIAGNOSIS — E559 Vitamin D deficiency, unspecified: Secondary | ICD-10-CM | POA: Diagnosis not present

## 2017-08-18 DIAGNOSIS — I10 Essential (primary) hypertension: Secondary | ICD-10-CM | POA: Diagnosis not present

## 2017-08-18 DIAGNOSIS — C8513 Unspecified B-cell lymphoma, intra-abdominal lymph nodes: Secondary | ICD-10-CM | POA: Diagnosis not present

## 2017-08-18 DIAGNOSIS — N401 Enlarged prostate with lower urinary tract symptoms: Secondary | ICD-10-CM | POA: Diagnosis not present

## 2017-08-18 DIAGNOSIS — M25551 Pain in right hip: Secondary | ICD-10-CM | POA: Diagnosis not present

## 2017-08-18 DIAGNOSIS — E782 Mixed hyperlipidemia: Secondary | ICD-10-CM | POA: Diagnosis not present

## 2017-08-18 DIAGNOSIS — I700 Atherosclerosis of aorta: Secondary | ICD-10-CM | POA: Diagnosis not present

## 2017-08-18 DIAGNOSIS — M7711 Lateral epicondylitis, right elbow: Secondary | ICD-10-CM | POA: Diagnosis not present

## 2017-08-18 DIAGNOSIS — Z79899 Other long term (current) drug therapy: Secondary | ICD-10-CM | POA: Diagnosis not present

## 2017-08-18 DIAGNOSIS — R7302 Impaired glucose tolerance (oral): Secondary | ICD-10-CM | POA: Diagnosis not present

## 2017-08-18 NOTE — Progress Notes (Signed)
08/19/2017 9:45 AM   [name] Sep 16, 1940 [id number]  Referring provider: Ezequiel Kayser, MD [place] [facility] [place], [place] [address]  Chief Complaint  Patient presents with  . Benign Prostatic Hypertrophy    HPI: Patient is 77 year old Caucasian male who presents today for his 1 year follow-up for BPH with LUTS.  BPH WITH LUTS His IPSS score today is 8, which is moderate lower urinary tract symptomatology.  He is pleased with his quality life due to his urinary symptoms.  His previous I PSS score was 6/2.  His major complaints today are nocturia, straining to urinate and a weak urinary stream.  He denies any dysuria, hematuria or suprapubic pain.   He currently taking finasteride 5 mg daily.  He also denies any recent fevers, chills, nausea or vomiting.   He has a family history of PCa, with his father being diagnosed with prostate cancer.   IPSS    Row Name 08/19/17 0900         International Prostate Symptom Score   How often have you had the sensation of not emptying your bladder?  Not at All     How often have you had to urinate less than every two hours?  Not at All     How often have you found you stopped and started again several times when you urinated?  Not at All     How often have you found it difficult to postpone urination?  About half the time     How often have you had a weak urinary stream?  About half the time     How often have you had to strain to start urination?  Not at All     How many times did you typically get up at night to urinate?  2 Times     Total IPSS Score  8       Quality of Life due to urinary symptoms   If you were to spend the rest of your life with your urinary condition just the way it is now how would you feel about that?  Pleased        Score:  1-7 Mild 8-19 Moderate 20-35 Severe   PMH: Past Medical History:  Diagnosis Date  . Acid reflux 06/03/2015  . Adenomatous colon polyp 08/12/2014   Overview:   On 05/25/2011 colonoscopy; repeat 05/2016 (Dr. Gustavo Lah)   . BPH (benign prostatic hyperplasia)   . Cancer ([place])    LYMPHOMA  . Dribbling   . ED (erectile dysfunction)   . Essential (primary) hypertension 06/03/2015  . Fatigue   . GERD (gastroesophageal reflux disease)   . Glaucoma 08/07/2013  . Hesitancy   . HTN (hypertension)   . Hypertension   . Hypogonadism in male   . Nocturia   . Obese   . Urgency-frequency syndrome     Surgical History: Past Surgical History:  Procedure Laterality Date  . BOWEL RESECTION  09/18/2015   Procedure: SMALL BOWEL RESECTION;  Surgeon: Clayburn Pert, MD;  Location: [facility] ORS;  Service: General;;  with anastomosis  . BOWEL RESECTION    . COLONOSCOPY WITH PROPOFOL N/A 01/04/2017   Procedure: COLONOSCOPY WITH PROPOFOL;  Surgeon: Lollie Sails, MD;  Location: [facility] ENDOSCOPY;  Service: Endoscopy;  Laterality: N/A;  . HERNIA REPAIR    . LAPAROSCOPY  09/18/2015   Procedure: LAPAROSCOPY DIAGNOSTIC;  Surgeon: Clayburn Pert, MD;  Location: [facility] ORS;  Service: General;;  . LAPAROTOMY  N/A 09/18/2015   Procedure: EXPLORATORY LAPAROTOMY;  Surgeon: Clayburn Pert, MD;  Location: [facility] ORS;  Service: General;  Laterality: N/A;    Home Medications:  Current Outpatient Medications on File Prior to Visit  Medication Sig Dispense Refill  . aspirin EC 81 MG tablet Take 81 mg by mouth daily.    Marland Kitchen ibuprofen (ADVIL,MOTRIN) 200 MG tablet Take 200 mg by mouth every 6 (six) hours as needed.    . latanoprost (XALATAN) 0.005 % ophthalmic solution Place 1 drop into both eyes every morning.     . Omega-3 Fatty Acids (FISH OIL) 1200 MG CAPS Take 1,200-2,400 mg by mouth See admin instructions. Take 2 capsules (2400mg ) by mouth every morning and 1 capsule by mouth in the evening.    . timolol (TIMOPTIC) 0.5 % ophthalmic solution Apply 1 drop to eye every morning.     Marland Kitchen lisinopril-hydrochlorothiazide (PRINZIDE,ZESTORETIC) 10-12.5 MG tablet Take 1 tablet by mouth daily. 30  tablet 0   No current facility-administered medications on file prior to visit.     Allergies: No Known Allergies  Family History: Family History  Problem Relation Age of Onset  . Prostate cancer Father   . Cancer Father   . Cancer Unknown        family members in general  . Cancer Sister   . Clotting disorder Sister   . Cancer Brother   . Bladder Cancer Neg Hx   . Kidney cancer Neg Hx     Social History:  reports that he has been smoking cigars.  He has never used smokeless tobacco. He reports that he drinks alcohol. He reports that he does not use drugs.  ROS: UROLOGY Frequent Urination?: No Hard to postpone urination?: No Burning/pain with urination?: No Get up at night to urinate?: Yes Leakage of urine?: No Urine stream starts and stops?: No Trouble starting stream?: No Do you have to strain to urinate?: Yes Blood in urine?: No Urinary tract infection?: No Sexually transmitted disease?: No Injury to kidneys or bladder?: No Painful intercourse?: No Weak stream?: Yes Erection problems?: Yes Penile pain?: No  Gastrointestinal Nausea?: No Vomiting?: No Indigestion/heartburn?: No Diarrhea?: No Constipation?: No  Constitutional Fever: No Night sweats?: No Weight loss?: No Fatigue?: No  Skin Skin rash/lesions?: No Itching?: No  Eyes Blurred vision?: No Double vision?: No  Ears/Nose/Throat Sore throat?: No Sinus problems?: No  Hematologic/Lymphatic Swollen glands?: No Easy bruising?: No  Cardiovascular Leg swelling?: No Chest pain?: No  Respiratory Cough?: No Shortness of breath?: No  Endocrine Excessive thirst?: No  Musculoskeletal Back pain?: No Joint pain?: Yes  Neurological Headaches?: No Dizziness?: No  Psychologic Depression?: No Anxiety?: No  Physical Exam: BP (!) 159/78   Pulse 83   Ht 5\' 10"  (1.778 m)   Wt 257 lb 9.6 oz (116.8 kg)   BMI 36.96 kg/m   Constitutional: Well nourished. Alert and oriented, No acute  distress. HEENT:  AT, moist mucus membranes. Trachea midline, no masses. Cardiovascular: No clubbing, cyanosis, or edema. Respiratory: Normal respiratory effort, no increased work of breathing. GI: Abdomen is soft, non tender, non distended, no abdominal masses. Liver and spleen not palpable.  No hernias appreciated.  Stool sample for occult testing is not indicated.   GU: No CVA tenderness.  No bladder fullness or masses.  Patient with uncircumcised phallus. Foreskin easily retracted  Urethral meatus is patent.  No penile discharge. No penile lesions or rashes. Scrotum without lesions, cysts, rashes and/or edema.  Testicles are located scrotally bilaterally.  No masses are appreciated in the testicles. Left and right epididymis are normal. Rectal: Patient with  normal sphincter tone. Anus and perineum without scarring or rashes. No rectal masses are appreciated. Prostate is approximately 45 grams, no nodules are appreciated. Seminal vesicles are normal. Skin: No rashes, bruises or suspicious lesions. Lymph: No cervical or inguinal adenopathy. Neurologic: Grossly intact, no focal deficits, moving all 4 extremities. Psychiatric: Normal mood and affect.   Laboratory Data: PSA History  0.3 ng/mL on 05/23/2015 Results for orders placed or performed in visit on 06/01/17  Comprehensive metabolic panel  Result Value Ref Range   Sodium 138 135 - 145 mmol/L   Potassium 4.2 3.5 - 5.1 mmol/L   Chloride 103 101 - 111 mmol/L   CO2 27 22 - 32 mmol/L   Glucose, Bld 117 (H) 65 - 99 mg/dL   BUN 18 6 - 20 mg/dL   Creatinine, Ser 1.04 0.61 - 1.24 mg/dL   Calcium 9.4 8.9 - 10.3 mg/dL   Total Protein 7.7 6.5 - 8.1 g/dL   Albumin 4.2 3.5 - 5.0 g/dL   AST 25 15 - 41 U/L   ALT 25 17 - 63 U/L   Alkaline Phosphatase 94 38 - 126 U/L   Total Bilirubin 0.6 0.3 - 1.2 mg/dL   GFR calc non Af Amer >60 >60 mL/min   GFR calc Af Amer >60 >60 mL/min   Anion gap 8 5 - 15  CBC with Differential  Result Value Ref  Range   WBC 7.7 3.8 - 10.6 K/uL   RBC 4.64 4.40 - 5.90 MIL/uL   Hemoglobin 14.6 13.0 - 18.0 g/dL   HCT 42.4 40.0 - 52.0 %   MCV 91.3 80.0 - 100.0 fL   MCH 31.6 26.0 - 34.0 pg   MCHC 34.5 32.0 - 36.0 g/dL   RDW 13.4 11.5 - 14.5 %   Platelets 172 150 - 440 K/uL   Neutrophils Relative % 58 %   Neutro Abs 4.4 1.4 - 6.5 K/uL   Lymphocytes Relative 23 %   Lymphs Abs 1.8 1.0 - 3.6 K/uL   Monocytes Relative 12 %   Monocytes Absolute 0.9 0.2 - 1.0 K/uL   Eosinophils Relative 6 %   Eosinophils Absolute 0.5 0 - 0.7 K/uL   Basophils Relative 1 %   Basophils Absolute 0.1 0 - 0.1 K/uL   I have reviewed the labs.   Assessment & Plan:    1. BPH with LUTS  - IPSS score is 8/1, it is stable  - Continue conservative management, avoiding bladder irritants and timed voiding's  - Continue finasteride 5 mg daily; refills given  - RTC in 12 months for IPS'S and exam    Return in about 1 year (around 08/20/2018) for I PSS and exam.  These notes generated with voice recognition software. I apologize for typographical errors.  Zara Council, PA-C  [facility] [address] [place], [place] [address] [phone]

## 2017-08-19 ENCOUNTER — Encounter: Payer: Self-pay | Admitting: Urology

## 2017-08-19 ENCOUNTER — Ambulatory Visit: Payer: PPO | Admitting: Urology

## 2017-08-19 VITALS — BP 159/78 | HR 83 | Ht 70.0 in | Wt 257.6 lb

## 2017-08-19 DIAGNOSIS — N401 Enlarged prostate with lower urinary tract symptoms: Secondary | ICD-10-CM

## 2017-08-19 DIAGNOSIS — N138 Other obstructive and reflux uropathy: Secondary | ICD-10-CM | POA: Diagnosis not present

## 2017-08-19 MED ORDER — FINASTERIDE 5 MG PO TABS: ORAL_TABLET | ORAL | 3 refills | Status: DC

## 2017-12-05 NOTE — Progress Notes (Signed)
.  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  He continues to feel well and remains asymptomatic.  He remains active and continues to golf several times per week. Patient denies any weight loss, fevers, chills, night sweats,  He denies any abdominal pain or changes in his bowel movements. He has no nausea, vomiting, constipation, or diarrhea.  He denies any chest pain or shortness of breath.  He has no neurological complaints. He has no urinary complaints.  Patient feels at his baseline offers no specific complaints today.  REVIEW OF SYSTEMS:   Review of Systems  Constitutional: Negative for chills, fever, malaise/fatigue and weight loss.  HENT: Negative for congestion and sore throat.   Respiratory: Negative.  Negative for cough, hemoptysis and shortness of breath.   Cardiovascular: Negative.  Negative for chest pain.  Gastrointestinal: Negative.  Negative for abdominal pain, blood in stool, constipation, diarrhea, melena, nausea and vomiting.  Genitourinary: Negative.   Musculoskeletal: Negative.   Skin: Negative.  Negative for rash.  Neurological: Negative.  Negative for dizziness, weakness and headaches.  Endo/Heme/Allergies: Does not bruise/bleed easily.  Psychiatric/Behavioral: Negative.  The patient is not nervous/anxious and does not have insomnia.     As per HPI. Otherwise, a complete review of systems is negative.  PAST MEDICAL HISTORY: Past Medical History:  Diagnosis Date  . Acid reflux 06/03/2015  . Adenomatous colon polyp 08/12/2014   Overview:  On 05/25/2011  colonoscopy; repeat 05/2016 (Dr. Gustavo Lah)   . BPH (benign prostatic hyperplasia)   . Cancer ([place])    LYMPHOMA  . Dribbling   . ED (erectile dysfunction)   . Essential (primary) hypertension 06/03/2015  . Fatigue   . GERD (gastroesophageal reflux disease)   . Glaucoma 08/07/2013  . Hesitancy   . HTN (hypertension)   . Hypertension   . Hypogonadism in male   . Nocturia   . Obese   . Urgency-frequency syndrome     PAST SURGICAL HISTORY: Past Surgical History:  Procedure Laterality Date  . BOWEL RESECTION  09/18/2015   Procedure: SMALL BOWEL RESECTION;  Surgeon: Clayburn Pert, MD;  Location: [facility] ORS;  Service: General;;  with anastomosis  . BOWEL RESECTION    . COLONOSCOPY WITH PROPOFOL N/A 01/04/2017   Procedure: COLONOSCOPY WITH PROPOFOL;  Surgeon: Lollie Sails, MD;  Location: [facility] ENDOSCOPY;  Service: Endoscopy;  Laterality: N/A;  . HERNIA REPAIR    . LAPAROSCOPY  09/18/2015   Procedure: LAPAROSCOPY DIAGNOSTIC;  Surgeon: Clayburn Pert, MD;  Location: [facility] ORS;  Service: General;;  . LAPAROTOMY N/A 09/18/2015   Procedure: EXPLORATORY LAPAROTOMY;  Surgeon: Clayburn Pert, MD;  Location: [facility] ORS;  Service: General;  Laterality: N/A;    FAMILY HISTORY: Family History  Problem Relation Age of Onset  . Prostate cancer Father   . Cancer Father   . Cancer Unknown        family members in general  . Cancer Sister   . Clotting disorder Sister   . Cancer Brother   . Bladder Cancer Neg Hx   . Kidney cancer Neg Hx        ADVANCED DIRECTIVES:    HEALTH  MAINTENANCE: Social History   Tobacco Use  . Smoking status: Current Some Day Smoker    Types: Cigars    Last attempt to quit: 04/10/2015    Years since quitting: 2.6  . Smokeless tobacco: Never Used  . Tobacco comment: occasional cigar  Substance Use Topics  . Alcohol use: Yes    Alcohol/week: 0.0 standard drinks    Comment: 1-2 drinks of Liquor Daily.  . Drug use: No     Colonoscopy:  PAP:  Bone  density:  Lipid panel:  No Known Allergies  Current Outpatient Medications  Medication Sig Dispense Refill  . aspirin EC 81 MG tablet Take 81 mg by mouth daily.    . finasteride (PROSCAR) 5 MG tablet TAKE ONE TABLET BY MOUTH DAILY 90 tablet 3  . ibuprofen (ADVIL,MOTRIN) 200 MG tablet Take 200 mg by mouth every 6 (six) hours as needed.    . latanoprost (XALATAN) 0.005 % ophthalmic solution Place 1 drop into both eyes every morning.     . Omega-3 Fatty Acids (FISH OIL) 1200 MG CAPS Take 1,200-2,400 mg by mouth See admin instructions. Take 2 capsules (2400mg ) by mouth every morning and 1 capsule by mouth in the evening.    . timolol (TIMOPTIC) 0.5 % ophthalmic solution Apply 1 drop to eye every morning.     Marland Kitchen lisinopril-hydrochlorothiazide (PRINZIDE,ZESTORETIC) 10-12.5 MG tablet Take 1 tablet by mouth daily. 30 tablet 0   No current facility-administered medications for this visit.     OBJECTIVE: Vitals:   12/10/17 0902  BP: (!) 180/91  Pulse: 64  Resp: 20  Temp: (!) 97.4 F (36.3 C)     Body mass index is 37.11 kg/m.    ECOG FS:0 - Asymptomatic  General: Well-developed, well-nourished, no acute distress. Eyes: Pink conjunctiva, anicteric sclera. HEENT: Normocephalic, moist mucous membranes. Lungs: Clear to auscultation bilaterally. Heart: Regular rate and rhythm. No rubs, murmurs, or gallops. Abdomen: Soft, nontender, nondistended. No organomegaly noted, normoactive bowel sounds. Musculoskeletal: No edema, cyanosis, or clubbing. Neuro: Alert, answering all questions appropriately. Cranial nerves grossly intact. Skin: No rashes or petechiae noted. Psych: Normal affect. Lymphatics: No cervical, calvicular, axillary or inguinal LAD.   LAB RESULTS:  Lab Results  Component Value Date   NA 139 12/08/2017   K 3.9 12/08/2017   CL 105 12/08/2017   CO2 23 12/08/2017   GLUCOSE 120 (H) 12/08/2017   BUN 27 (H) 12/08/2017   CREATININE 1.03 12/08/2017   CALCIUM 9.4 12/08/2017    PROT 7.4 12/08/2017   ALBUMIN 4.1 12/08/2017   AST 25 12/08/2017   ALT 26 12/08/2017   ALKPHOS 91 12/08/2017   BILITOT 0.7 12/08/2017   GFRNONAA >60 12/08/2017   GFRAA >60 12/08/2017    Lab Results  Component Value Date   WBC 6.4 12/08/2017   NEUTROABS 3.1 12/08/2017   HGB 14.1 12/08/2017   HCT 41.0 12/08/2017   MCV 93.3 12/08/2017   PLT 159 12/08/2017     STUDIES: Ct Abdomen Pelvis W Contrast  Result Date: 12/08/2017 CLINICAL DATA:  Lymphoma. Stage IV diffuse large B-cell lymphoma with extranodal involvement of the terminal ileum. EXAM: CT ABDOMEN AND PELVIS WITH CONTRAST TECHNIQUE: Multidetector CT imaging of the abdomen and pelvis was performed using the standard protocol following bolus administration of intravenous contrast. CONTRAST:  126mL ISOVUE-300 IOPAMIDOL (ISOVUE-300) INJECTION 61% COMPARISON:  06/01/2017 FINDINGS: Lower chest: Unremarkable. Hepatobiliary: No focal abnormality within the liver parenchyma. There is no evidence for gallstones, gallbladder wall thickening, or pericholecystic fluid. No  intrahepatic or extrahepatic biliary dilation. Pancreas: No focal mass lesion. No dilatation of the main duct. No intraparenchymal cyst. No peripancreatic edema. Spleen: No splenomegaly. No focal mass lesion. Adrenals/Urinary Tract: No adrenal nodule or mass. 14 mm cyst upper pole right kidney is stable. 3.1 cm cyst upper pole left kidney also similar to prior. No evidence for hydroureter. The urinary bladder appears normal for the degree of distention. Stomach/Bowel: Stomach is nondistended. No gastric wall thickening. No evidence of outlet obstruction. Duodenum is normally positioned as is the ligament of Treitz. No small bowel wall thickening. No small bowel dilatation. The terminal ileum is normal. Diverticular changes are noted in the left colon without evidence of diverticulitis. Vascular/Lymphatic: There is abdominal aortic atherosclerosis without aneurysm. There is no  gastrohepatic or hepatoduodenal ligament lymphadenopathy. No intraperitoneal or retroperitoneal lymphadenopathy. No pelvic sidewall lymphadenopathy. Reproductive: The prostate gland and seminal vesicles have normal imaging features. Other: No intraperitoneal free fluid. Musculoskeletal: Small bilateral groin hernias contain only fat. No worrisome lytic or sclerotic osseous abnormality. IMPRESSION: 1. Stable exam. No evidence for lymphadenopathy in the abdomen or pelvis. 2. Bilateral renal cysts. 3.  Aortic Atherosclerois (ICD10-170.0) Electronically Signed   By: Misty Stanley M.D.   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 Because of the extensive extranodal involvement of the terminal illium, this is considered stage IV disease. He completed his chemotherapy on March 13, 2016.  CT scan results from December 08, 2017 reviewed independently and report as above with no evidence of recurrent or progressive disease.  Patient's most recent colonoscopy on January 04, 2017 removed several benign polyps but otherwise was reported as normal.  No further intervention is needed at this time.  Patient is now nearly 2 years removed from completing his treatments and can be switched to yearly imaging.  Return to clinic 1 to 2 days later to discuss the results. 2.  Hypertension: Patient's blood pressure remains significantly elevated.  Continue current treatment and evaluation per primary care.    I spent a total of 20 minutes face-to-face with the patient of which greater than 50% of the visit was spent in counseling and coordination of care as detailed above.    Patient expressed understanding and was in agreement with this plan. He also understands that He can call clinic at any time  with any questions, concerns, or complaints.   Cancer Staging Diffuse large B cell lymphoma ([place]) Staging form: Lymphoid Neoplasms, AJCC 6th Edition - Clinical stage from 10/09/2015: Stage IV - Signed by Lloyd Huger, MD on 10/09/2015   Lloyd Huger, MD   12/10/2017 10:32 AM

## 2017-12-08 ENCOUNTER — Inpatient Hospital Stay: Payer: PPO | Attending: Oncology

## 2017-12-08 ENCOUNTER — Ambulatory Visit
Admission: RE | Admit: 2017-12-08 | Discharge: 2017-12-08 | Disposition: A | Discharge status: Home / Self Care | Payer: PPO | Source: Ambulatory Visit | Attending: Oncology | Admitting: Oncology

## 2017-12-08 DIAGNOSIS — C8339 Diffuse large B-cell lymphoma, extranodal and solid organ sites: Secondary | ICD-10-CM

## 2017-12-08 DIAGNOSIS — I700 Atherosclerosis of aorta: Secondary | ICD-10-CM | POA: Diagnosis not present

## 2017-12-08 DIAGNOSIS — Z9221 Personal history of antineoplastic chemotherapy: Secondary | ICD-10-CM | POA: Diagnosis not present

## 2017-12-08 DIAGNOSIS — C8330 Diffuse large B-cell lymphoma, unspecified site: Secondary | ICD-10-CM | POA: Insufficient documentation

## 2017-12-08 DIAGNOSIS — I10 Essential (primary) hypertension: Secondary | ICD-10-CM | POA: Insufficient documentation

## 2017-12-08 DIAGNOSIS — N281 Cyst of kidney, acquired: Secondary | ICD-10-CM | POA: Insufficient documentation

## 2017-12-08 DIAGNOSIS — F17290 Nicotine dependence, other tobacco product, uncomplicated: Secondary | ICD-10-CM | POA: Insufficient documentation

## 2017-12-08 DIAGNOSIS — C83398 Diffuse large b-cell lymphoma of other extranodal and solid organ sites: Secondary | ICD-10-CM

## 2017-12-08 LAB — LACTATE DEHYDROGENASE: LDH: 142 U/L (ref 98–192)

## 2017-12-08 LAB — CBC WITH DIFFERENTIAL/PLATELET
BASOS PCT: 1 %
Basophils Absolute: 0.1 10*3/uL (ref 0–0.1)
Eosinophils Absolute: 0.3 10*3/uL (ref 0–0.7)
Eosinophils Relative: 5 %
HCT: 41 % (ref 40.0–52.0)
HEMOGLOBIN: 14.1 g/dL (ref 13.0–18.0)
LYMPHS ABS: 2.1 10*3/uL (ref 1.0–3.6)
Lymphocytes Relative: 32 %
MCH: 32.1 pg (ref 26.0–34.0)
MCHC: 34.5 g/dL (ref 32.0–36.0)
MCV: 93.3 fL (ref 80.0–100.0)
Monocytes Absolute: 0.9 10*3/uL (ref 0.2–1.0)
Monocytes Relative: 14 %
NEUTROS PCT: 48 %
Neutro Abs: 3.1 10*3/uL (ref 1.4–6.5)
Platelets: 159 10*3/uL (ref 150–440)
RBC: 4.39 MIL/uL — AB (ref 4.40–5.90)
RDW: 13.5 % (ref 11.5–14.5)
WBC: 6.4 10*3/uL (ref 3.8–10.6)

## 2017-12-08 LAB — COMPREHENSIVE METABOLIC PANEL
ALT: 26 U/L (ref 0–44)
AST: 25 U/L (ref 15–41)
Albumin: 4.1 g/dL (ref 3.5–5.0)
Alkaline Phosphatase: 91 U/L (ref 38–126)
Anion gap: 11 (ref 5–15)
BUN: 27 mg/dL — ABNORMAL HIGH (ref 8–23)
CALCIUM: 9.4 mg/dL (ref 8.9–10.3)
CHLORIDE: 105 mmol/L (ref 98–111)
CO2: 23 mmol/L (ref 22–32)
CREATININE: 1.03 mg/dL (ref 0.61–1.24)
Glucose, Bld: 120 mg/dL — ABNORMAL HIGH (ref 70–99)
Potassium: 3.9 mmol/L (ref 3.5–5.1)
Sodium: 139 mmol/L (ref 135–145)
Total Bilirubin: 0.7 mg/dL (ref 0.3–1.2)
Total Protein: 7.4 g/dL (ref 6.5–8.1)

## 2017-12-08 MED ORDER — IOPAMIDOL (ISOVUE-300) INJECTION 61%
100.0000 mL | Freq: Once | INTRAVENOUS | Status: AC | PRN
  Administered 2017-12-08: 100 mL via INTRAVENOUS

## 2017-12-10 ENCOUNTER — Inpatient Hospital Stay (HOSPITAL_BASED_OUTPATIENT_CLINIC_OR_DEPARTMENT_OTHER): Payer: PPO | Admitting: Oncology

## 2017-12-10 ENCOUNTER — Encounter: Payer: Self-pay | Admitting: Oncology

## 2017-12-10 VITALS — BP 180/91 | HR 64 | Temp 97.4°F | Resp 20 | Wt 258.6 lb

## 2017-12-10 DIAGNOSIS — Z9221 Personal history of antineoplastic chemotherapy: Secondary | ICD-10-CM

## 2017-12-10 DIAGNOSIS — C8339 Diffuse large B-cell lymphoma, extranodal and solid organ sites: Secondary | ICD-10-CM

## 2017-12-10 DIAGNOSIS — I10 Essential (primary) hypertension: Secondary | ICD-10-CM | POA: Diagnosis not present

## 2017-12-10 DIAGNOSIS — F17290 Nicotine dependence, other tobacco product, uncomplicated: Secondary | ICD-10-CM | POA: Diagnosis not present

## 2017-12-10 DIAGNOSIS — C8330 Diffuse large B-cell lymphoma, unspecified site: Secondary | ICD-10-CM

## 2017-12-10 NOTE — Progress Notes (Signed)
Patient here today for results, denies concerns.

## 2018-01-27 DIAGNOSIS — H401131 Primary open-angle glaucoma, bilateral, mild stage: Secondary | ICD-10-CM | POA: Diagnosis not present

## 2018-02-01 DIAGNOSIS — H401131 Primary open-angle glaucoma, bilateral, mild stage: Secondary | ICD-10-CM | POA: Diagnosis not present

## 2018-02-16 DIAGNOSIS — I700 Atherosclerosis of aorta: Secondary | ICD-10-CM | POA: Diagnosis not present

## 2018-02-16 DIAGNOSIS — N401 Enlarged prostate with lower urinary tract symptoms: Secondary | ICD-10-CM | POA: Diagnosis not present

## 2018-02-16 DIAGNOSIS — Z23 Encounter for immunization: Secondary | ICD-10-CM | POA: Diagnosis not present

## 2018-02-16 DIAGNOSIS — R351 Nocturia: Secondary | ICD-10-CM | POA: Diagnosis not present

## 2018-02-16 DIAGNOSIS — Z79899 Other long term (current) drug therapy: Secondary | ICD-10-CM | POA: Diagnosis not present

## 2018-02-16 DIAGNOSIS — I10 Essential (primary) hypertension: Secondary | ICD-10-CM | POA: Diagnosis not present

## 2018-02-16 DIAGNOSIS — E559 Vitamin D deficiency, unspecified: Secondary | ICD-10-CM | POA: Diagnosis not present

## 2018-02-16 DIAGNOSIS — C8513 Unspecified B-cell lymphoma, intra-abdominal lymph nodes: Secondary | ICD-10-CM | POA: Diagnosis not present

## 2018-02-16 DIAGNOSIS — E782 Mixed hyperlipidemia: Secondary | ICD-10-CM | POA: Diagnosis not present

## 2018-02-16 DIAGNOSIS — R7302 Impaired glucose tolerance (oral): Secondary | ICD-10-CM | POA: Diagnosis not present

## 2018-03-11 ENCOUNTER — Other Ambulatory Visit: Payer: Self-pay | Admitting: Nurse Practitioner

## 2018-03-19 IMAGING — DX DG ABD PORTABLE 2V
3 series · 3 of 3 positions shown · non-contrast
Comparison: 06/16/2015.

CLINICAL DATA: Small-bowel obstruction.

EXAM:
PORTABLE ABDOMEN - 2 VIEW

[abdomen erect]
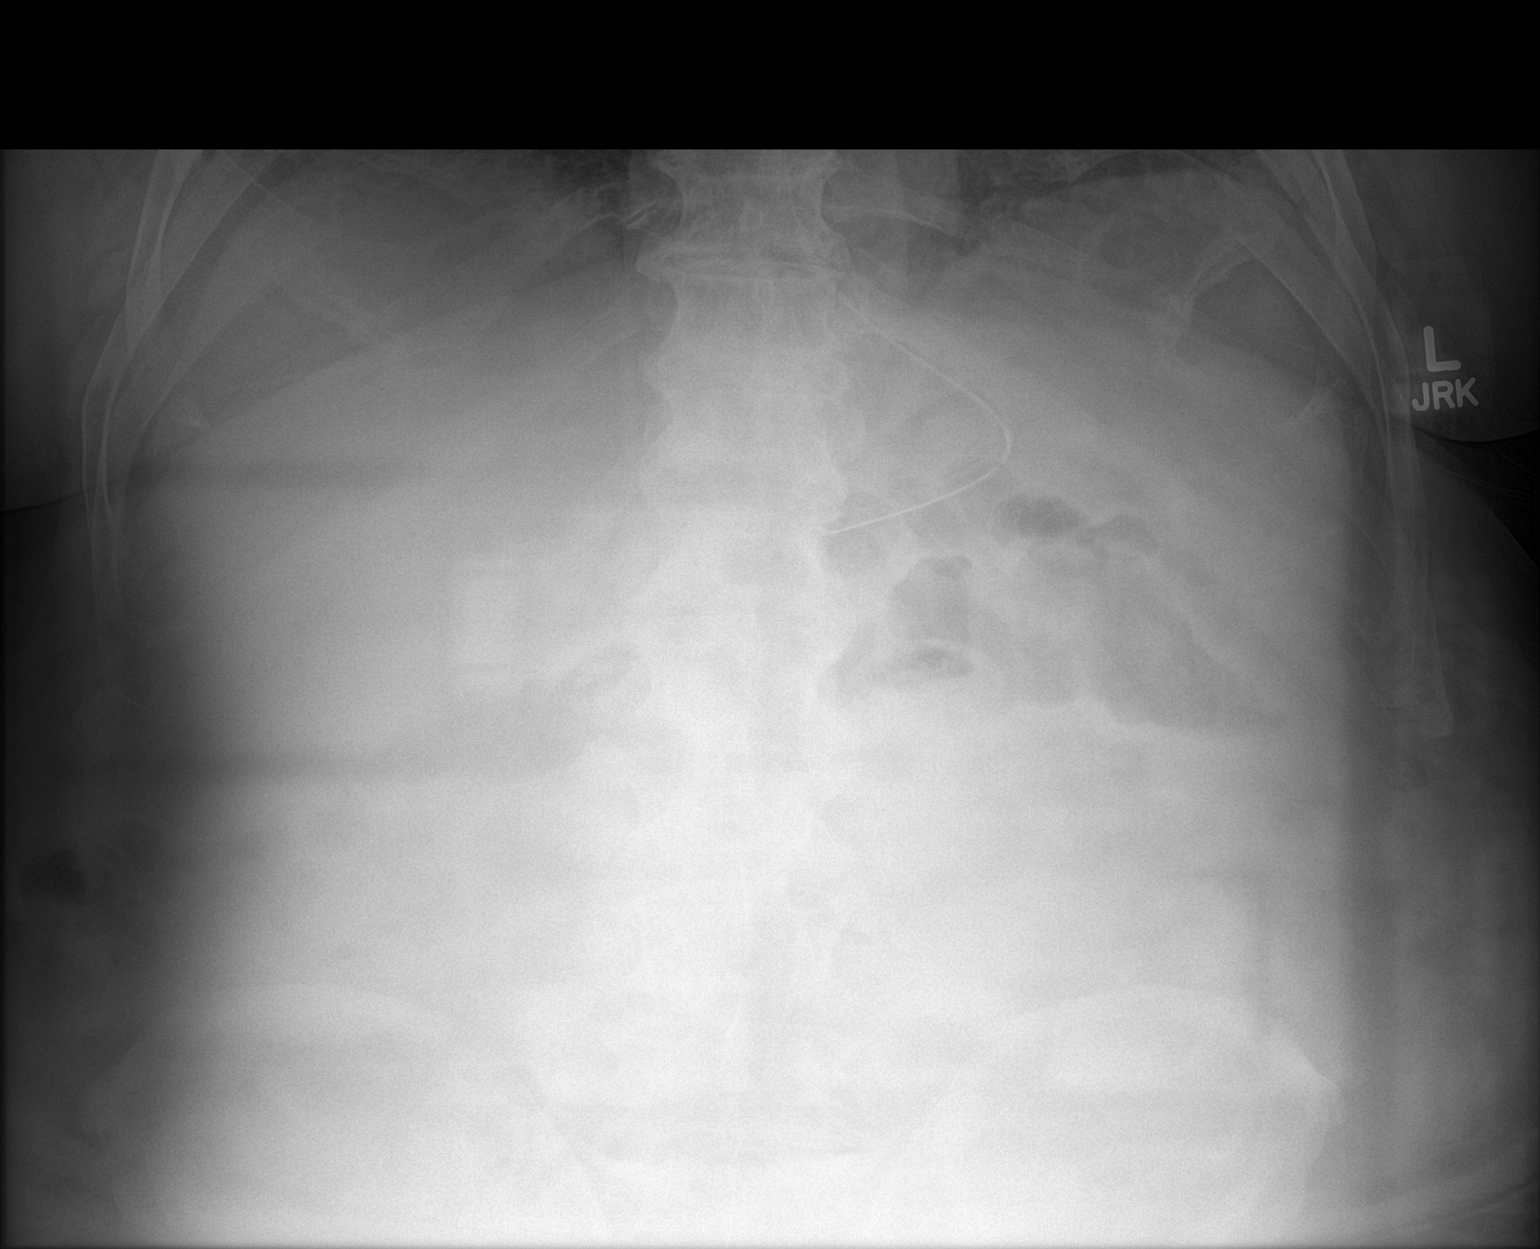

[abdomen supine (1 of 2)]
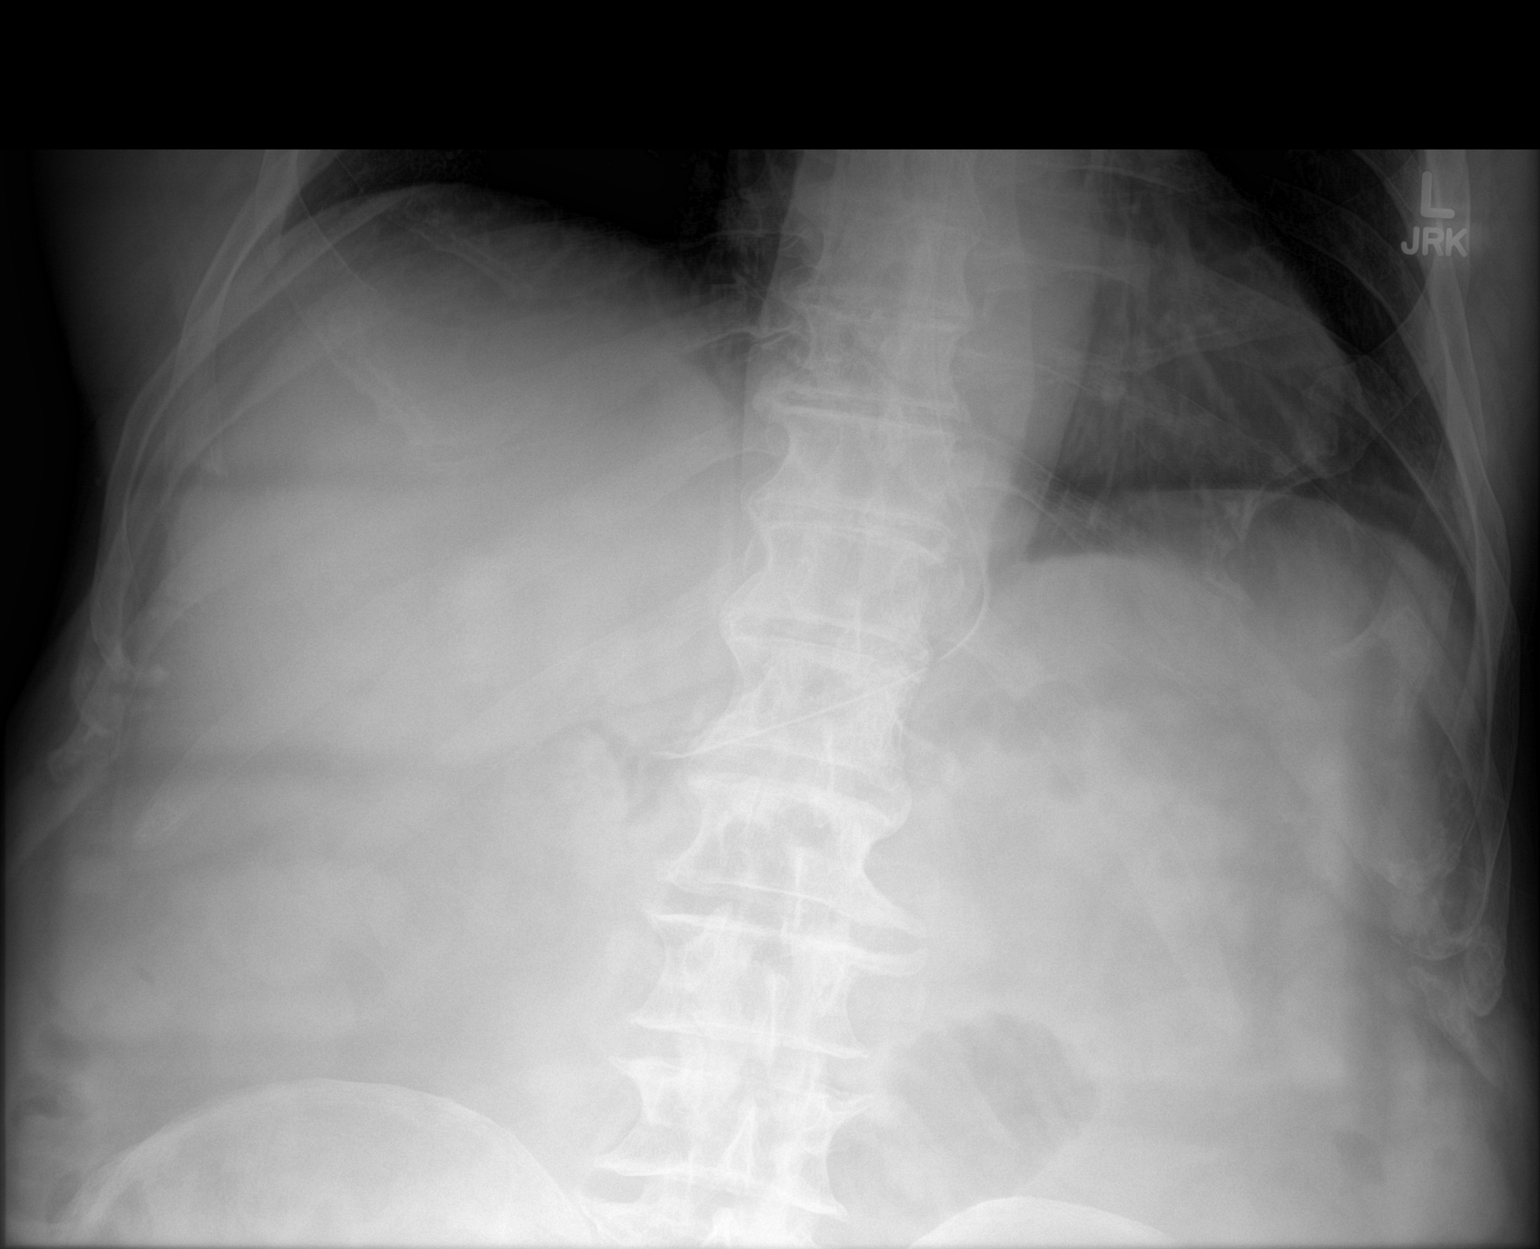

[abdomen supine (2 of 2)]
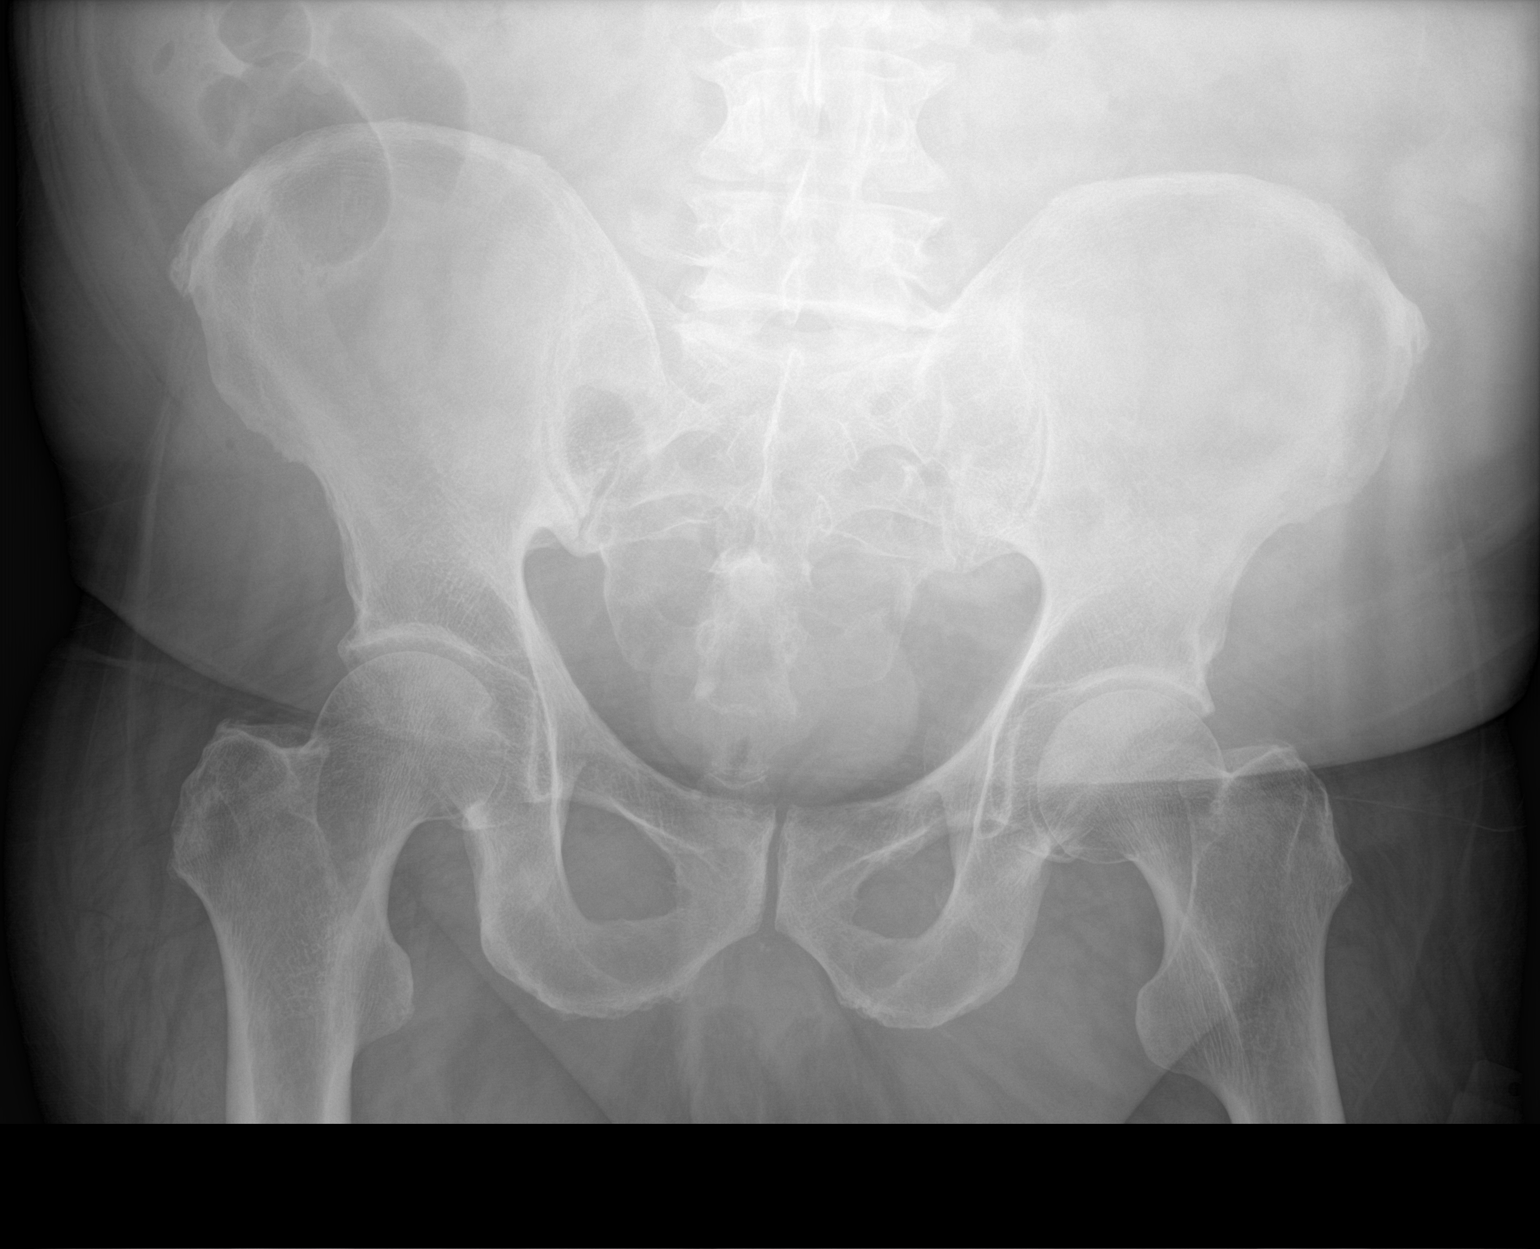

[3 of 3 positions shown; findings below may reference images not displayed]

FINDINGS: NG tube noted with tip projected over the stomach. Previously
identified bowel distention has significantly improved new small
bowel distention remains. No free air. No acute bony abnormality.
IMPRESSION: 1. NG tube noted in stable position.

2. Significant improvement of bowel distention. Small bowel
distention however does remain. Continued follow-up exam suggested .

## 2018-03-20 IMAGING — CR DG ABDOMEN 2V
1 series · 5 of 5 positions shown · non-contrast
Comparison: Yesterday

CLINICAL DATA: Small bowel obstruction

EXAM:
ABDOMEN - 2 VIEW

[Series 1: dg abd 2 views · 0.14mm/px · 5 of 5 slices shown]
[im 1/5]
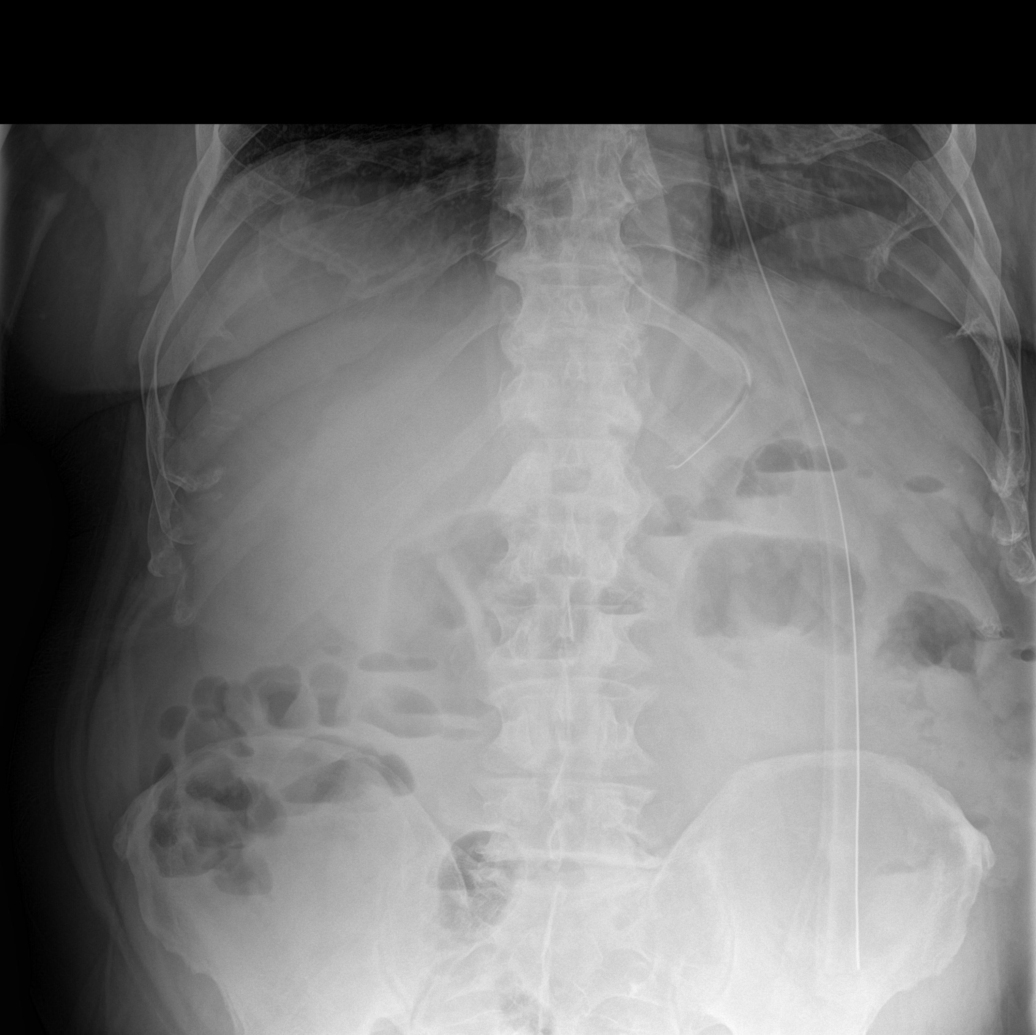
[im 2/5]
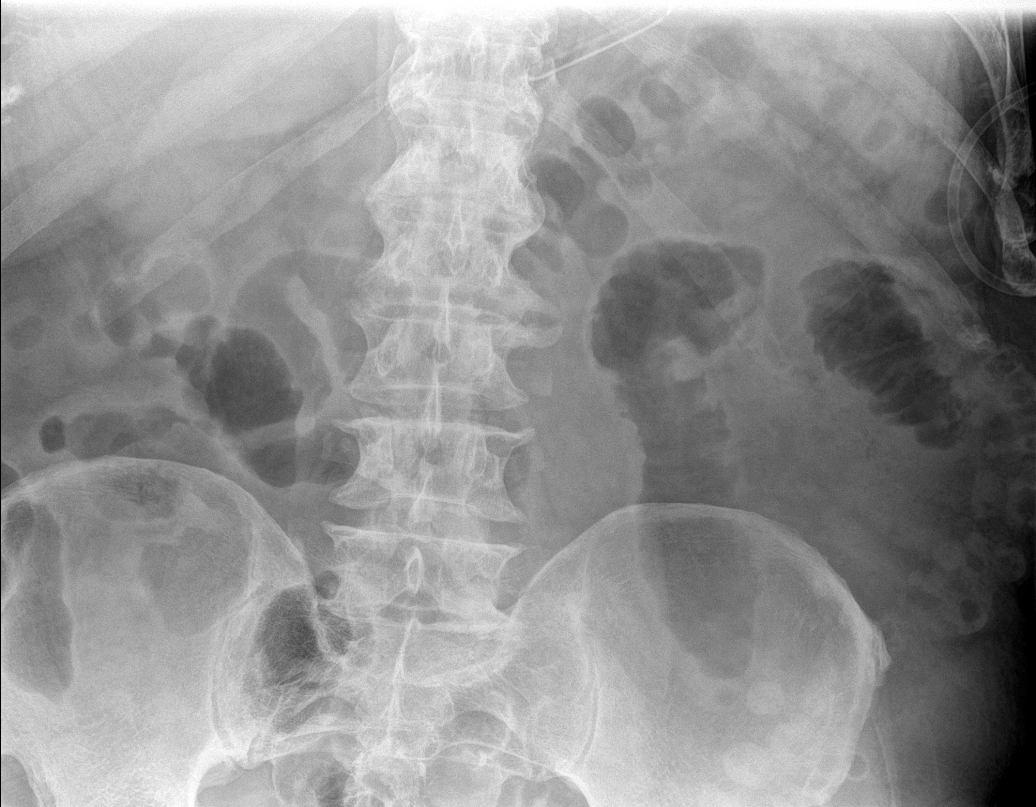
[im 3/5]
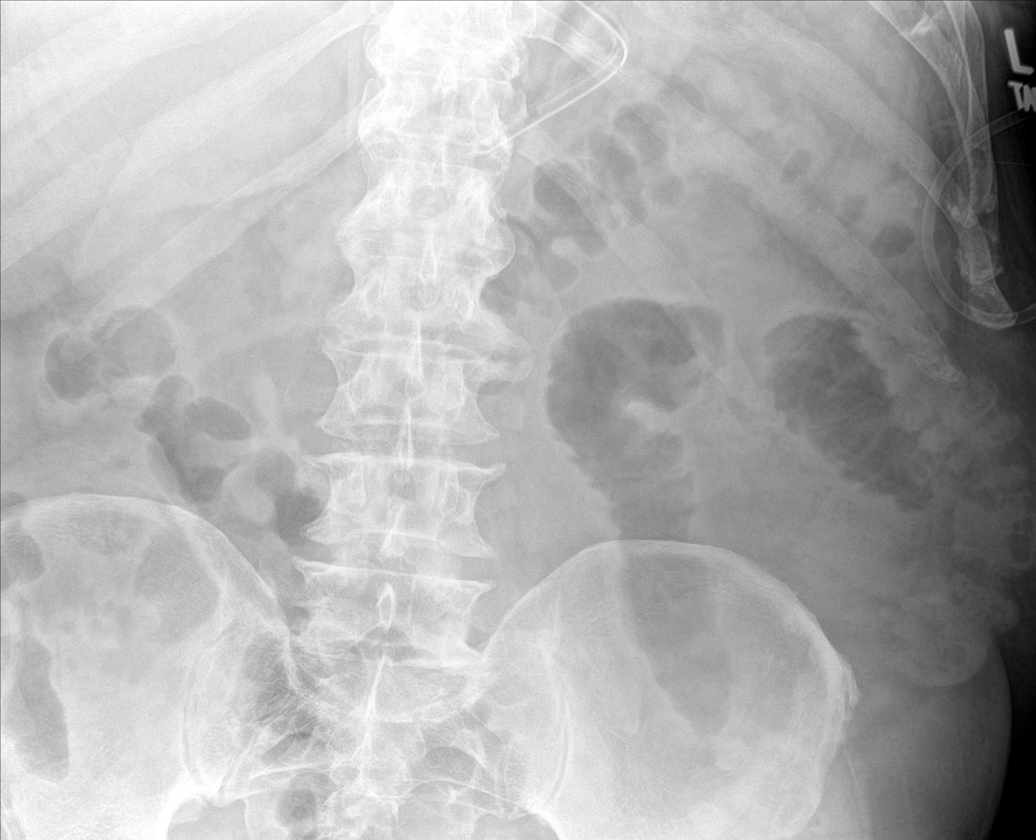
[im 4/5]
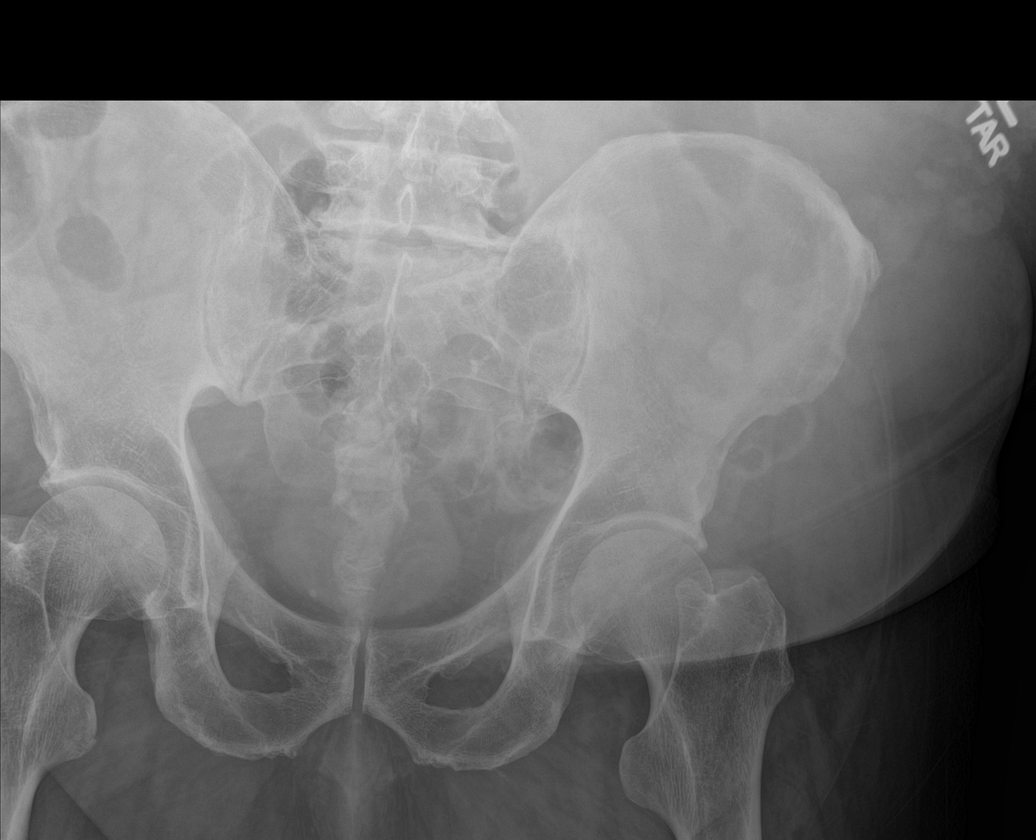
[im 5/5]
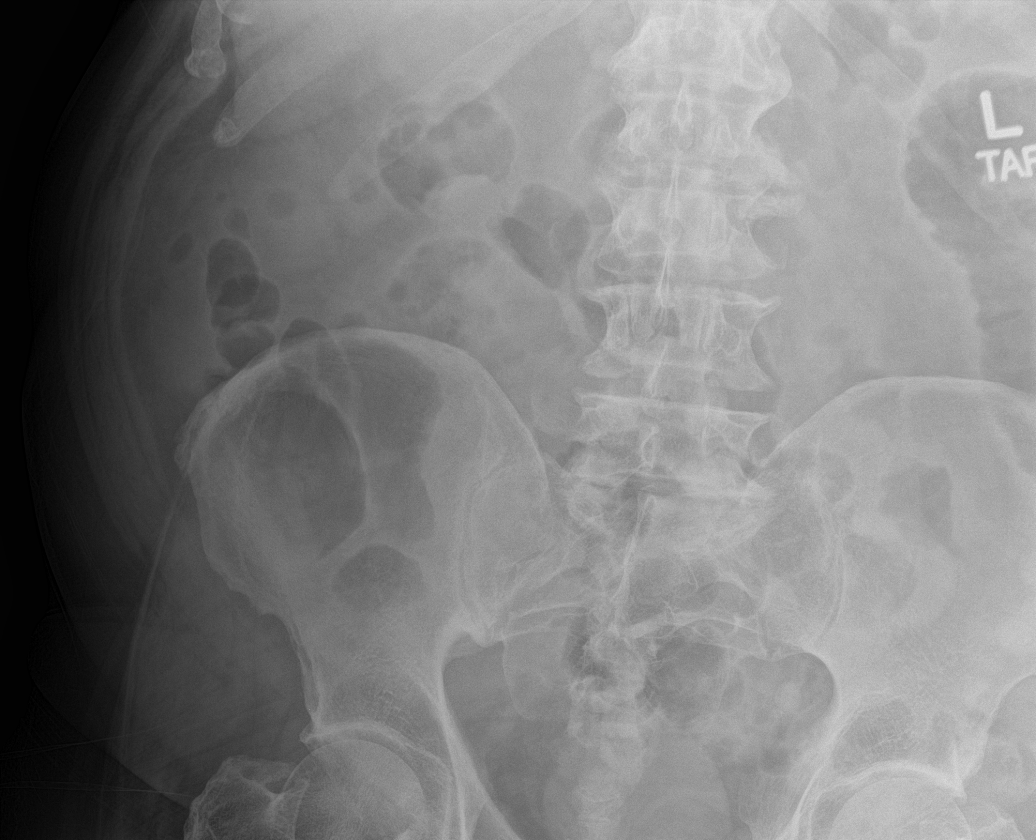

[5 of 5 positions shown; findings below may reference images not displayed]

FINDINGS: Nasogastric tube tip overlaps the mid stomach.

Loops of small bowel in the left abdomen are dilated at 38 mm but
distention is decreased from yesterday and especially from admission
CT. Distal colonic diverticulosis.
IMPRESSION: Continued improvement in small bowel distention. Near normal bowel
gas pattern.

## 2018-08-18 DIAGNOSIS — E782 Mixed hyperlipidemia: Secondary | ICD-10-CM | POA: Diagnosis not present

## 2018-08-18 DIAGNOSIS — C8513 Unspecified B-cell lymphoma, intra-abdominal lymph nodes: Secondary | ICD-10-CM | POA: Diagnosis not present

## 2018-08-18 DIAGNOSIS — N401 Enlarged prostate with lower urinary tract symptoms: Secondary | ICD-10-CM | POA: Diagnosis not present

## 2018-08-18 DIAGNOSIS — I10 Essential (primary) hypertension: Secondary | ICD-10-CM | POA: Diagnosis not present

## 2018-08-18 DIAGNOSIS — I700 Atherosclerosis of aorta: Secondary | ICD-10-CM | POA: Diagnosis not present

## 2018-08-18 DIAGNOSIS — Z79899 Other long term (current) drug therapy: Secondary | ICD-10-CM | POA: Diagnosis not present

## 2018-08-18 DIAGNOSIS — Z0000 Encounter for general adult medical examination without abnormal findings: Secondary | ICD-10-CM | POA: Diagnosis not present

## 2018-08-18 DIAGNOSIS — R7302 Impaired glucose tolerance (oral): Secondary | ICD-10-CM | POA: Diagnosis not present

## 2018-08-18 DIAGNOSIS — R351 Nocturia: Secondary | ICD-10-CM | POA: Diagnosis not present

## 2018-08-18 DIAGNOSIS — M5030 Other cervical disc degeneration, unspecified cervical region: Secondary | ICD-10-CM | POA: Diagnosis not present

## 2018-08-18 DIAGNOSIS — E559 Vitamin D deficiency, unspecified: Secondary | ICD-10-CM | POA: Diagnosis not present

## 2018-08-24 NOTE — Progress Notes (Signed)
08/25/2018 9:51 AM   Zachary Robertson Aug 02, 1940 [id number]  Referring provider: Ezequiel Kayser, MD [facility] [place], [place] [address]  No chief complaint on file.   HPI: Patient is 78 year old Caucasian male who presents today for his 1 year follow-up for BPH with LUTS.  BPH WITH LUTS  (prostate and/or bladder) IPSS score: 8/1     Previous score: 8/1      Major complaint(s): weak stream x a few years. Denies any dysuria, hematuria or suprapubic pain.   Currently taking: finasteride 5 mg daily  Denies any recent fevers, chills, nausea or vomiting.  Father diagnosed with prostate cancer.    IPSS    Row Name 08/25/18 0900         International Prostate Symptom Score   How often have you had the sensation of not emptying your bladder?  Less than 1 in 5     How often have you had to urinate less than every two hours?  Less than 1 in 5 times     How often have you found you stopped and started again several times when you urinated?  Less than 1 in 5 times     How often have you found it difficult to postpone urination?  Less than 1 in 5 times     How often have you had a weak urinary stream?  About half the time     How often have you had to strain to start urination?  Not at All     How many times did you typically get up at night to urinate?  1 Time     Total IPSS Score  8       Quality of Life due to urinary symptoms   If you were to spend the rest of your life with your urinary condition just the way it is now how would you feel about that?  Pleased        Score:  1-7 Mild 8-19 Moderate 20-35 Severe   PMH: Past Medical History:  Diagnosis Date  . Acid reflux 06/03/2015  . Adenomatous colon polyp 08/12/2014   Overview:  On 05/25/2011 colonoscopy; repeat 05/2016 (Dr. Gustavo Lah)   . BPH (benign prostatic hyperplasia)   . Cancer ([place])    LYMPHOMA  . Dribbling   . ED (erectile dysfunction)   . Essential (primary) hypertension 06/03/2015   . Fatigue   . GERD (gastroesophageal reflux disease)   . Glaucoma 08/07/2013  . Hesitancy   . HTN (hypertension)   . Hypertension   . Hypogonadism in male   . Nocturia   . Obese   . Urgency-frequency syndrome     Surgical History: Past Surgical History:  Procedure Laterality Date  . BOWEL RESECTION  09/18/2015   Procedure: SMALL BOWEL RESECTION;  Surgeon: Clayburn Pert, MD;  Location: [facility] ORS;  Service: General;;  with anastomosis  . BOWEL RESECTION    . COLONOSCOPY WITH PROPOFOL N/A 01/04/2017   Procedure: COLONOSCOPY WITH PROPOFOL;  Surgeon: Lollie Sails, MD;  Location: [facility] ENDOSCOPY;  Service: Endoscopy;  Laterality: N/A;  . HERNIA REPAIR    . LAPAROSCOPY  09/18/2015   Procedure: LAPAROSCOPY DIAGNOSTIC;  Surgeon: Clayburn Pert, MD;  Location: [facility] ORS;  Service: General;;  . LAPAROTOMY N/A 09/18/2015   Procedure: EXPLORATORY LAPAROTOMY;  Surgeon: Clayburn Pert, MD;  Location: [facility] ORS;  Service: General;  Laterality: N/A;    Home Medications:  Current Outpatient Medications on File Prior  to Visit  Medication Sig Dispense Refill  . aspirin EC 81 MG tablet Take 81 mg by mouth daily.    Marland Kitchen ibuprofen (ADVIL,MOTRIN) 200 MG tablet Take 200 mg by mouth every 6 (six) hours as needed.    . latanoprost (XALATAN) 0.005 % ophthalmic solution Place 1 drop into both eyes every morning.     Marland Kitchen lisinopril-hydrochlorothiazide (PRINZIDE,ZESTORETIC) 10-12.5 MG tablet Take 1 tablet by mouth daily. 30 tablet 0  . Omega-3 Fatty Acids (FISH OIL) 1200 MG CAPS Take 1,200-2,400 mg by mouth See admin instructions. Take 2 capsules (2400mg ) by mouth every morning and 1 capsule by mouth in the evening.    . timolol (TIMOPTIC) 0.5 % ophthalmic solution Apply 1 drop to eye every morning.      No current facility-administered medications on file prior to visit.     Allergies: No Known Allergies  Family History: Family History  Problem Relation Age of Onset  . Prostate cancer Father   .  Cancer Father   . Cancer Unknown        family members in general  . Cancer Sister   . Clotting disorder Sister   . Cancer Brother   . Bladder Cancer Neg Hx   . Kidney cancer Neg Hx     Social History:  reports that he has been smoking cigars. He has never used smokeless tobacco. He reports current alcohol use. He reports that he does not use drugs.  ROS: UROLOGY Frequent Urination?: No Hard to postpone urination?: No Burning/pain with urination?: No Get up at night to urinate?: No Leakage of urine?: No Urine stream starts and stops?: No Trouble starting stream?: No Do you have to strain to urinate?: No Blood in urine?: No Urinary tract infection?: No Sexually transmitted disease?: No Injury to kidneys or bladder?: No Painful intercourse?: No Weak stream?: Yes Erection problems?: Yes Penile pain?: No  Gastrointestinal Nausea?: No Vomiting?: No Indigestion/heartburn?: No Diarrhea?: No Constipation?: No  Constitutional Fever: No Night sweats?: No Weight loss?: No Fatigue?: No  Skin Skin rash/lesions?: No Itching?: No  Eyes Blurred vision?: No Double vision?: No  Ears/Nose/Throat Sore throat?: No Sinus problems?: No  Hematologic/Lymphatic Swollen glands?: No Easy bruising?: No  Cardiovascular Leg swelling?: No Chest pain?: No  Respiratory Cough?: No Shortness of breath?: No  Endocrine Excessive thirst?: No  Musculoskeletal Back pain?: No Joint pain?: No  Neurological Headaches?: No Dizziness?: No  Psychologic Depression?: No Anxiety?: No  Physical Exam: BP (!) 157/81   Pulse 74   Wt 259 lb 1.6 oz (117.5 kg)   BMI 37.18 kg/m   Constitutional:  Well nourished. Alert and oriented, No acute distress. HEENT: [place] AT, moist mucus membranes.  Trachea midline, no masses. Cardiovascular: No clubbing, cyanosis, or edema. Respiratory: Normal respiratory effort, no increased work of breathing. GI: Abdomen is obese, soft, non tender, non  distended, no abdominal masses. Liver and spleen not palpable.  No hernias appreciated.  Stool sample for occult testing is not indicated.   GU: No CVA tenderness.  No bladder fullness or masses.  Patient with uncircumcised phallus. Foreskin easily retracted.   Urethral meatus is patent.  No penile discharge. No penile lesions or rashes. Scrotum without lesions, cysts, rashes and/or edema.  Testicles are located scrotally bilaterally. No masses are appreciated in the testicles. Left and right epididymis are normal. Rectal: Patient with  normal sphincter tone. Anus and perineum without scarring or rashes. No rectal masses are appreciated. Prostate is approximately 40 grams, no nodules  are appreciated.  Skin: No rashes, bruises or suspicious lesions. Lymph: No inguinal adenopathy. Neurologic: Grossly intact, no focal deficits, moving all 4 extremities. Psychiatric: Normal mood and affect.   Laboratory Data: PSA History  0.3 ng/mL on 05/23/2015 Results for orders placed or performed in visit on 12/08/17  Lactate dehydrogenase  Result Value Ref Range   LDH 142 98 - 192 U/L  Comprehensive metabolic panel  Result Value Ref Range   Sodium 139 135 - 145 mmol/L   Potassium 3.9 3.5 - 5.1 mmol/L   Chloride 105 98 - 111 mmol/L   CO2 23 22 - 32 mmol/L   Glucose, Bld 120 (H) 70 - 99 mg/dL   BUN 27 (H) 8 - 23 mg/dL   Creatinine, Ser 1.03 0.61 - 1.24 mg/dL   Calcium 9.4 8.9 - 10.3 mg/dL   Total Protein 7.4 6.5 - 8.1 g/dL   Albumin 4.1 3.5 - 5.0 g/dL   AST 25 15 - 41 U/L   ALT 26 0 - 44 U/L   Alkaline Phosphatase 91 38 - 126 U/L   Total Bilirubin 0.7 0.3 - 1.2 mg/dL   GFR calc non Af Amer >60 >60 mL/min   GFR calc Af Amer >60 >60 mL/min   Anion gap 11 5 - 15  CBC with Differential/Platelet  Result Value Ref Range   WBC 6.4 3.8 - 10.6 K/uL   RBC 4.39 (L) 4.40 - 5.90 MIL/uL   Hemoglobin 14.1 13.0 - 18.0 g/dL   HCT 41.0 40.0 - 52.0 %   MCV 93.3 80.0 - 100.0 fL   MCH 32.1 26.0 - 34.0 pg   MCHC  34.5 32.0 - 36.0 g/dL   RDW 13.5 11.5 - 14.5 %   Platelets 159 150 - 440 K/uL   Neutrophils Relative % 48 %   Neutro Abs 3.1 1.4 - 6.5 K/uL   Lymphocytes Relative 32 %   Lymphs Abs 2.1 1.0 - 3.6 K/uL   Monocytes Relative 14 %   Monocytes Absolute 0.9 0.2 - 1.0 K/uL   Eosinophils Relative 5 %   Eosinophils Absolute 0.3 0 - 0.7 K/uL   Basophils Relative 1 %   Basophils Absolute 0.1 0 - 0.1 K/uL   I have reviewed the labs.   Assessment & Plan:    1. BPH with LUTS IPSS score is 8/1, it is stable Continue conservative management, avoiding bladder irritants and timed voiding's Most bothersome symptom is a weak stream, but it is not bothersome enough to warrant further treatment or investigation Continue finasteride 5 mg daily; refills given RTC in 12 months for I PSS and exam    Return in about 1 year (around 08/25/2019) for I PSS and exam .  These notes generated with voice recognition software. I apologize for typographical errors.  Zara Council, PA-C  [facility] [address] [place], [place] [address] [phone]

## 2018-08-25 ENCOUNTER — Encounter: Payer: Self-pay | Admitting: Urology

## 2018-08-25 ENCOUNTER — Ambulatory Visit (INDEPENDENT_AMBULATORY_CARE_PROVIDER_SITE_OTHER): Payer: PPO | Admitting: Urology

## 2018-08-25 ENCOUNTER — Other Ambulatory Visit: Payer: Self-pay

## 2018-08-25 VITALS — BP 157/81 | HR 74 | Wt 259.1 lb

## 2018-08-25 DIAGNOSIS — N401 Enlarged prostate with lower urinary tract symptoms: Secondary | ICD-10-CM

## 2018-08-25 DIAGNOSIS — N138 Other obstructive and reflux uropathy: Secondary | ICD-10-CM

## 2018-08-25 MED ORDER — FINASTERIDE 5 MG PO TABS: ORAL_TABLET | ORAL | 3 refills | Status: DC

## 2018-12-08 NOTE — Progress Notes (Deleted)
.  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  He continues to feel well and remains asymptomatic.  He remains active and continues to golf several times per week. Patient denies any weight loss, fevers, chills, night sweats,  He denies any abdominal pain or changes in his bowel movements. He has no nausea, vomiting, constipation, or diarrhea.  He denies any chest pain or shortness of breath.  He has no neurological complaints. He has no urinary complaints.  Patient feels at his baseline offers no specific complaints today.  REVIEW OF SYSTEMS:   Review of Systems  Constitutional: Negative for chills, fever, malaise/fatigue and weight loss.  HENT: Negative for congestion and sore throat.   Respiratory: Negative.  Negative for cough, hemoptysis and shortness of breath.   Cardiovascular: Negative.  Negative for chest pain.  Gastrointestinal: Negative.  Negative for abdominal pain, blood in stool, constipation, diarrhea, melena, nausea and vomiting.  Genitourinary: Negative.   Musculoskeletal: Negative.   Skin: Negative.  Negative for rash.  Neurological: Negative.  Negative for dizziness, weakness and headaches.  Endo/Heme/Allergies: Does not bruise/bleed easily.  Psychiatric/Behavioral: Negative.  The patient is not nervous/anxious and does not have insomnia.     As per HPI. Otherwise, a complete review of systems is negative.  PAST MEDICAL HISTORY: Past Medical History:  Diagnosis Date  . Acid reflux 06/03/2015  . Adenomatous colon polyp 08/12/2014   Overview:  On 05/25/2011  colonoscopy; repeat 05/2016 (Dr. Gustavo Lah)   . BPH (benign prostatic hyperplasia)   . Cancer ([place])    LYMPHOMA  . Dribbling   . ED (erectile dysfunction)   . Essential (primary) hypertension 06/03/2015  . Fatigue   . GERD (gastroesophageal reflux disease)   . Glaucoma 08/07/2013  . Hesitancy   . HTN (hypertension)   . Hypertension   . Hypogonadism in male   . Nocturia   . Obese   . Urgency-frequency syndrome     PAST SURGICAL HISTORY: Past Surgical History:  Procedure Laterality Date  . BOWEL RESECTION  09/18/2015   Procedure: SMALL BOWEL RESECTION;  Surgeon: Clayburn Pert, MD;  Location: [facility] ORS;  Service: General;;  with anastomosis  . BOWEL RESECTION    . COLONOSCOPY WITH PROPOFOL N/A 01/04/2017   Procedure: COLONOSCOPY WITH PROPOFOL;  Surgeon: Lollie Sails, MD;  Location: [facility] ENDOSCOPY;  Service: Endoscopy;  Laterality: N/A;  . HERNIA REPAIR    . LAPAROSCOPY  09/18/2015   Procedure: LAPAROSCOPY DIAGNOSTIC;  Surgeon: Clayburn Pert, MD;  Location: [facility] ORS;  Service: General;;  . LAPAROTOMY N/A 09/18/2015   Procedure: EXPLORATORY LAPAROTOMY;  Surgeon: Clayburn Pert, MD;  Location: [facility] ORS;  Service: General;  Laterality: N/A;    FAMILY HISTORY: Family History  Problem Relation Age of Onset  . Prostate cancer Father   . Cancer Father   . Cancer Unknown        family members in general  . Cancer Sister   . Clotting disorder Sister   . Cancer Brother   . Bladder Cancer Neg Hx   . Kidney cancer Neg Hx        ADVANCED DIRECTIVES:    HEALTH  MAINTENANCE: Social History   Tobacco Use  . Smoking status: Current Some Day Smoker    Types: Cigars    Last attempt to quit: 04/10/2015    Years since quitting: 3.6  . Smokeless tobacco: Never Used  . Tobacco comment: occasional cigar  Substance Use Topics  . Alcohol use: Yes    Alcohol/week: 0.0 standard drinks    Comment: 1-2 drinks of Liquor Daily.  . Drug use: No     Colonoscopy:  PAP:  Bone density:   Lipid panel:  No Known Allergies  Current Outpatient Medications  Medication Sig Dispense Refill  . aspirin EC 81 MG tablet Take 81 mg by mouth daily.    . finasteride (PROSCAR) 5 MG tablet TAKE ONE TABLET BY MOUTH DAILY 90 tablet 3  . ibuprofen (ADVIL,MOTRIN) 200 MG tablet Take 200 mg by mouth every 6 (six) hours as needed.    . latanoprost (XALATAN) 0.005 % ophthalmic solution Place 1 drop into both eyes every morning.     Marland Kitchen lisinopril-hydrochlorothiazide (PRINZIDE,ZESTORETIC) 10-12.5 MG tablet Take 1 tablet by mouth daily. 30 tablet 0  . Omega-3 Fatty Acids (FISH OIL) 1200 MG CAPS Take 1,200-2,400 mg by mouth See admin instructions. Take 2 capsules (2400mg ) by mouth every morning and 1 capsule by mouth in the evening.    . timolol (TIMOPTIC) 0.5 % ophthalmic solution Apply 1 drop to eye every morning.      No current facility-administered medications for this visit.     OBJECTIVE: There were no vitals filed for this visit.   There is no height or weight on file to calculate BMI.    ECOG FS:0 - Asymptomatic  General: Well-developed, well-nourished, no acute distress. Eyes: Pink conjunctiva, anicteric sclera. HEENT: Normocephalic, moist mucous membranes. Lungs: Clear to auscultation bilaterally. Heart: Regular rate and rhythm. No rubs, murmurs, or gallops. Abdomen: Soft, nontender, nondistended. No organomegaly noted, normoactive bowel sounds. Musculoskeletal: No edema, cyanosis, or clubbing. Neuro: Alert, answering all questions appropriately. Cranial nerves grossly intact. Skin: No rashes or petechiae noted. Psych: Normal affect. Lymphatics: No cervical, calvicular, axillary or inguinal LAD.   LAB RESULTS:  Lab Results  Component Value Date   NA 139 12/08/2017   K 3.9 12/08/2017   CL 105 12/08/2017   CO2 23 12/08/2017   GLUCOSE 120 (H) 12/08/2017   BUN 27 (H) 12/08/2017   CREATININE 1.03 12/08/2017   CALCIUM 9.4 12/08/2017   PROT 7.4 12/08/2017   ALBUMIN 4.1  12/08/2017   AST 25 12/08/2017   ALT 26 12/08/2017   ALKPHOS 91 12/08/2017   BILITOT 0.7 12/08/2017   GFRNONAA >60 12/08/2017   GFRAA >60 12/08/2017    Lab Results  Component Value Date   WBC 6.4 12/08/2017   NEUTROABS 3.1 12/08/2017   HGB 14.1 12/08/2017   HCT 41.0 12/08/2017   MCV 93.3 12/08/2017   PLT 159 12/08/2017     STUDIES: No results found.  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 Because of the extensive extranodal involvement of the terminal illium, this is considered stage IV disease. He completed his chemotherapy on March 13, 2016.  CT scan results from December 08, 2017 reviewed independently and report as above with no evidence of recurrent or progressive disease.  Patient's most recent colonoscopy on January 04, 2017 removed several benign polyps but  otherwise was reported as normal.  No further intervention is needed at this time.  Patient is now nearly 2 years removed from completing his treatments and can be switched to yearly imaging.  Return to clinic 1 to 2 days later to discuss the results. 2.  Hypertension: Patient's blood pressure remains significantly elevated.  Continue current treatment and evaluation per primary care.    I spent a total of 20 minutes face-to-face with the patient of which greater than 50% of the visit was spent in counseling and coordination of care as detailed above.    Patient expressed understanding and was in agreement with this plan. He also understands that He can call clinic at any time with any questions, concerns, or complaints.   Cancer Staging Diffuse large B cell lymphoma ([place]) Staging form: Lymphoid Neoplasms, AJCC 6th Edition - Clinical stage from 10/09/2015: Stage IV - Signed by Lloyd Huger, MD on  10/09/2015   Lloyd Huger, MD   12/08/2018 11:59 PM

## 2018-12-09 ENCOUNTER — Other Ambulatory Visit: Payer: Self-pay

## 2018-12-12 ENCOUNTER — Other Ambulatory Visit: Payer: Self-pay | Admitting: *Deleted

## 2018-12-12 ENCOUNTER — Telehealth: Payer: Self-pay | Admitting: *Deleted

## 2018-12-12 ENCOUNTER — Ambulatory Visit
Admission: RE | Admit: 2018-12-12 | Discharge: 2018-12-12 | Disposition: A | Discharge status: Home / Self Care | Payer: PPO | Source: Ambulatory Visit | Attending: Oncology | Admitting: Oncology

## 2018-12-12 ENCOUNTER — Inpatient Hospital Stay: Payer: PPO | Attending: Hematology and Oncology

## 2018-12-12 ENCOUNTER — Other Ambulatory Visit: Payer: Self-pay

## 2018-12-12 DIAGNOSIS — F17210 Nicotine dependence, cigarettes, uncomplicated: Secondary | ICD-10-CM | POA: Diagnosis not present

## 2018-12-12 DIAGNOSIS — C8339 Diffuse large B-cell lymphoma, extranodal and solid organ sites: Secondary | ICD-10-CM | POA: Insufficient documentation

## 2018-12-12 DIAGNOSIS — Z791 Long term (current) use of non-steroidal anti-inflammatories (NSAID): Secondary | ICD-10-CM | POA: Diagnosis not present

## 2018-12-12 DIAGNOSIS — N400 Enlarged prostate without lower urinary tract symptoms: Secondary | ICD-10-CM | POA: Insufficient documentation

## 2018-12-12 DIAGNOSIS — Z7982 Long term (current) use of aspirin: Secondary | ICD-10-CM | POA: Diagnosis not present

## 2018-12-12 DIAGNOSIS — Z8042 Family history of malignant neoplasm of prostate: Secondary | ICD-10-CM | POA: Diagnosis not present

## 2018-12-12 DIAGNOSIS — C8330 Diffuse large B-cell lymphoma, unspecified site: Secondary | ICD-10-CM | POA: Insufficient documentation

## 2018-12-12 DIAGNOSIS — I10 Essential (primary) hypertension: Secondary | ICD-10-CM | POA: Diagnosis not present

## 2018-12-12 DIAGNOSIS — Z79899 Other long term (current) drug therapy: Secondary | ICD-10-CM | POA: Diagnosis not present

## 2018-12-12 DIAGNOSIS — Z9221 Personal history of antineoplastic chemotherapy: Secondary | ICD-10-CM | POA: Diagnosis not present

## 2018-12-12 DIAGNOSIS — C8333 Diffuse large B-cell lymphoma, intra-abdominal lymph nodes: Secondary | ICD-10-CM | POA: Diagnosis not present

## 2018-12-12 LAB — COMPREHENSIVE METABOLIC PANEL
ALT: 34 U/L (ref 0–44)
AST: 24 U/L (ref 15–41)
Albumin: 4.2 g/dL (ref 3.5–5.0)
Alkaline Phosphatase: 90 U/L (ref 38–126)
Anion gap: 8 (ref 5–15)
BUN: 21 mg/dL (ref 8–23)
CO2: 25 mmol/L (ref 22–32)
Calcium: 9.3 mg/dL (ref 8.9–10.3)
Chloride: 100 mmol/L (ref 98–111)
Creatinine, Ser: 0.98 mg/dL (ref 0.61–1.24)
GFR calc Af Amer: >60 mL/min (ref >60)
GFR calc non Af Amer: >60 mL/min (ref >60)
Glucose, Bld: 108 mg/dL — ABNORMAL HIGH (ref 70–99)
Potassium: 4.4 mmol/L (ref 3.5–5.1)
Sodium: 133 mmol/L — ABNORMAL LOW (ref 135–145)
Total Bilirubin: 0.7 mg/dL (ref 0.3–1.2)
Total Protein: 7.5 g/dL (ref 6.5–8.1)

## 2018-12-12 LAB — CBC WITH DIFFERENTIAL/PLATELET
Abs Immature Granulocytes: 0.03 10*3/uL (ref 0.00–0.07)
Basophils Absolute: 0.1 10*3/uL (ref 0.0–0.1)
Basophils Relative: 1 %
Eosinophils Absolute: 0.4 10*3/uL (ref 0.0–0.5)
Eosinophils Relative: 4 %
HCT: 41 % (ref 39.0–52.0)
Hemoglobin: 14.3 g/dL (ref 13.0–17.0)
Immature Granulocytes: 0 %
Lymphocytes Relative: 31 %
Lymphs Abs: 2.5 10*3/uL (ref 0.7–4.0)
MCH: 31.9 pg (ref 26.0–34.0)
MCHC: 34.9 g/dL (ref 30.0–36.0)
MCV: 91.5 fL (ref 80.0–100.0)
Monocytes Absolute: 1 10*3/uL (ref 0.1–1.0)
Monocytes Relative: 13 %
Neutro Abs: 4.1 10*3/uL (ref 1.7–7.7)
Neutrophils Relative %: 51 %
Platelets: 196 10*3/uL (ref 150–400)
RBC: 4.48 MIL/uL (ref 4.22–5.81)
RDW: 13 % (ref 11.5–15.5)
WBC: 8.1 10*3/uL (ref 4.0–10.5)
nRBC: 0 % (ref 0.0–0.2)

## 2018-12-12 LAB — LACTATE DEHYDROGENASE: LDH: 143 U/L (ref 98–192)

## 2018-12-12 MED ORDER — IOHEXOL 300 MG/ML  SOLN
100.0000 mL | Freq: Once | INTRAMUSCULAR | Status: AC | PRN
  Administered 2018-12-12: 09:00:00 100 mL via INTRAVENOUS

## 2018-12-12 NOTE — Telephone Encounter (Signed)
Called report  IMPRESSION: 1. Long segment of nonspecific bowel wall thickening involving the proximal jejunum. Differential includes nonspecific nondistention, enteritis, and lymphoma in patient has history of small bowel involvement. Consider CT or MR enterography for further evaluation. 2. No lymphadenopathy within the abdomen or pelvis. 3.  Aortic Atherosclerosis (ICD10-I70.0).  These results will be called to the ordering clinician or representative by the Radiologist Assistant, and communication documented in the PACS or zVision Dashboard.   Electronically Signed   By: Suzy Bouchard M.D.   On: 12/12/2018 10:40

## 2018-12-16 ENCOUNTER — Inpatient Hospital Stay: Payer: PPO | Admitting: Oncology

## 2018-12-16 ENCOUNTER — Encounter: Payer: Self-pay | Admitting: Oncology

## 2018-12-16 ENCOUNTER — Inpatient Hospital Stay (HOSPITAL_BASED_OUTPATIENT_CLINIC_OR_DEPARTMENT_OTHER): Payer: PPO | Admitting: Oncology

## 2018-12-16 ENCOUNTER — Other Ambulatory Visit: Payer: Self-pay

## 2018-12-16 VITALS — BP 154/80 | HR 58 | Temp 98.3°F | Ht 70.0 in | Wt 265.0 lb

## 2018-12-16 DIAGNOSIS — C8330 Diffuse large B-cell lymphoma, unspecified site: Secondary | ICD-10-CM | POA: Diagnosis not present

## 2018-12-16 DIAGNOSIS — C8339 Diffuse large B-cell lymphoma, extranodal and solid organ sites: Secondary | ICD-10-CM | POA: Diagnosis not present

## 2018-12-16 NOTE — Progress Notes (Signed)
.  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  He continues to feel well and remains asymptomatic. He remains active and continues to golf several times per week. Patient denies any weight loss, fevers, chills, night sweats,  He denies any abdominal pain or changes in his bowel movements. He has no nausea, vomiting, constipation, or diarrhea.  He denies any chest pain, shortness of breath, cough, or hemoptysis.  He has no neurological complaints. He has no urinary complaints.  Patient offers no specific complaints today.    REVIEW OF SYSTEMS:   Review of Systems  Constitutional: Negative for chills, fever, malaise/fatigue and weight loss.  HENT: Negative for congestion and sore throat.   Respiratory: Negative.  Negative for cough, hemoptysis and shortness of breath.   Cardiovascular: Negative.  Negative for chest pain.  Gastrointestinal: Negative.  Negative for abdominal pain, blood in stool, constipation, diarrhea, melena, nausea and vomiting.  Genitourinary: Negative.   Musculoskeletal: Negative.   Skin: Negative.  Negative for rash.  Neurological: Negative.  Negative for dizziness, weakness and headaches.  Endo/Heme/Allergies: Does not bruise/bleed easily.  Psychiatric/Behavioral: Negative.  The patient is not nervous/anxious and does not have insomnia.     As per HPI. Otherwise, a complete review of systems is negative.  PAST MEDICAL HISTORY: Past Medical History:  Diagnosis Date  . Acid reflux 06/03/2015  . Adenomatous colon polyp 08/12/2014   Overview:  On 05/25/2011 colonoscopy; repeat 05/2016  (Dr. Gustavo Lah)   . BPH (benign prostatic hyperplasia)   . Cancer ([place])    LYMPHOMA  . Dribbling   . ED (erectile dysfunction)   . Essential (primary) hypertension 06/03/2015  . Fatigue   . GERD (gastroesophageal reflux disease)   . Glaucoma 08/07/2013  . Hesitancy   . HTN (hypertension)   . Hypertension   . Hypogonadism in male   . Nocturia   . Obese   . Urgency-frequency syndrome     PAST SURGICAL HISTORY: Past Surgical History:  Procedure Laterality Date  . BOWEL RESECTION  09/18/2015   Procedure: SMALL BOWEL RESECTION;  Surgeon: Clayburn Pert, MD;  Location: [facility] ORS;  Service: General;;  with anastomosis  . BOWEL RESECTION    . COLONOSCOPY WITH PROPOFOL N/A 01/04/2017   Procedure: COLONOSCOPY WITH PROPOFOL;  Surgeon: Lollie Sails, MD;  Location: [facility] ENDOSCOPY;  Service: Endoscopy;  Laterality: N/A;  . HERNIA REPAIR    . LAPAROSCOPY  09/18/2015   Procedure: LAPAROSCOPY DIAGNOSTIC;  Surgeon: Clayburn Pert, MD;  Location: [facility] ORS;  Service: General;;  . LAPAROTOMY N/A 09/18/2015   Procedure: EXPLORATORY LAPAROTOMY;  Surgeon: Clayburn Pert, MD;  Location: [facility] ORS;  Service: General;  Laterality: N/A;    FAMILY HISTORY: Family History  Problem Relation Age of Onset  . Prostate cancer Father   . Cancer Father   . Cancer Unknown        family members in general  . Cancer Sister   . Clotting disorder Sister   . Cancer Brother   . Bladder Cancer Neg Hx   . Kidney cancer Neg Hx        ADVANCED DIRECTIVES:    HEALTH MAINTENANCE: Social History  Tobacco Use  . Smoking status: Current Some Day Smoker    Types: Cigars    Last attempt to quit: 04/10/2015    Years since quitting: 3.6  . Smokeless tobacco: Never Used  . Tobacco comment: occasional cigar  Substance Use Topics  . Alcohol use: Yes    Alcohol/week: 0.0 standard drinks    Comment: 1-2 drinks of Liquor Daily.  . Drug use: No     Colonoscopy:  PAP:  Bone density:  Lipid panel:  No Known  Allergies  Current Outpatient Medications  Medication Sig Dispense Refill  . aspirin EC 81 MG tablet Take 81 mg by mouth daily.    . finasteride (PROSCAR) 5 MG tablet TAKE ONE TABLET BY MOUTH DAILY 90 tablet 3  . ibuprofen (ADVIL,MOTRIN) 200 MG tablet Take 200 mg by mouth every 6 (six) hours as needed.    . latanoprost (XALATAN) 0.005 % ophthalmic solution Place 1 drop into both eyes every morning.     Marland Kitchen lisinopril-hydrochlorothiazide (PRINZIDE,ZESTORETIC) 10-12.5 MG tablet Take 1 tablet by mouth daily. 30 tablet 0  . Omega-3 Fatty Acids (FISH OIL) 1200 MG CAPS Take 1,200-2,400 mg by mouth See admin instructions. Take 2 capsules (2400mg ) by mouth every morning and 1 capsule by mouth in the evening.    . pravastatin (PRAVACHOL) 20 MG tablet Take 1 tablet by mouth.    . timolol (TIMOPTIC) 0.5 % ophthalmic solution Apply 1 drop to eye every morning.      No current facility-administered medications for this visit.     OBJECTIVE: Vitals:   12/16/18 1012  BP: (!) 154/80  Pulse: (!) 58  Temp: 98.3 F (36.8 C)     Body mass index is 38.02 kg/m.    ECOG FS:0 - Asymptomatic  General: Well-developed, well-nourished, no acute distress. Eyes: Pink conjunctiva, anicteric sclera. HEENT: Normocephalic, moist mucous membranes. Lungs: Clear to auscultation bilaterally. Heart: Regular rate and rhythm. No rubs, murmurs, or gallops. Abdomen: Soft, nontender, nondistended. No organomegaly noted, normoactive bowel sounds. Musculoskeletal: No edema, cyanosis, or clubbing. Neuro: Alert, answering all questions appropriately. Cranial nerves grossly intact. Skin: No rashes or petechiae noted. Psych: Normal affect.   LAB RESULTS:  Lab Results  Component Value Date   NA 133 (L) 12/12/2018   K 4.4 12/12/2018   CL 100 12/12/2018   CO2 25 12/12/2018   GLUCOSE 108 (H) 12/12/2018   BUN 21 12/12/2018   CREATININE 0.98 12/12/2018   CALCIUM 9.3 12/12/2018   PROT 7.5 12/12/2018   ALBUMIN 4.2 12/12/2018    AST 24 12/12/2018   ALT 34 12/12/2018   ALKPHOS 90 12/12/2018   BILITOT 0.7 12/12/2018   GFRNONAA >60 12/12/2018   GFRAA >60 12/12/2018    Lab Results  Component Value Date   WBC 8.1 12/12/2018   NEUTROABS 4.1 12/12/2018   HGB 14.3 12/12/2018   HCT 41.0 12/12/2018   MCV 91.5 12/12/2018   PLT 196 12/12/2018     STUDIES: Ct Abdomen Pelvis W Contrast  Result Date: 12/12/2018 CLINICAL DATA:  Stage IV diffuse large B-cell lymphoma with extranodal involvement of terminal ilium. Bowel resection. He continues to feel well and remains asymptomatic. Patient denies any weight loss, fevers, chills, night sweats. EXAM: CT ABDOMEN AND PELVIS WITH CONTRAST TECHNIQUE: Multidetector CT imaging of the abdomen and pelvis was performed using the standard protocol following bolus administration of intravenous contrast. CONTRAST:  124mL OMNIPAQUE IOHEXOL 300 MG/ML  SOLN COMPARISON:  12/08/2017 FINDINGS: Lower chest: Lung bases are clear. Hepatobiliary: No  focal hepatic lesion. No biliary duct dilatation. Gallbladder is normal. Common bile duct is normal. Pancreas: Pancreas is normal. No ductal dilatation. No pancreatic inflammation. Spleen: Normal spleen Adrenals/urinary tract: Adrenal glands normal. Simple fluid attenuation cyst of the LEFT kidney. Simple fluid attenuation cyst of the RIGHT kidney. Ureters and bladder normal. Stomach/Bowel: Stomach and duodenum appear normal. Within the proximal jejunum, there is a long 10 cm to 15 cm segment bowel wall thickening. The bowel wall is thickened up to 10 mm (image 47/94). More distally the small bowel is normal. Ileum is normal. The terminal ileum appears normal. No evidence of lymphadenopathy small bowel mesentery. The ascending transverse colon normal. Mild diverticulosis sigmoid colon rectum appears normal. Vascular/Lymphatic: Abdominal aorta is normal caliber with atherosclerotic calcification. There is no retroperitoneal or periportal lymphadenopathy. No  pelvic lymphadenopathy. Reproductive: Prostate normal Other: No free fluid. Musculoskeletal: No aggressive osseous lesion. IMPRESSION: 1. Long segment of nonspecific bowel wall thickening involving the proximal jejunum. Differential includes nonspecific nondistention, enteritis, and lymphoma in patient has history of small bowel involvement. Consider CT or MR enterography for further evaluation. 2. No lymphadenopathy within the abdomen or pelvis. 3.  Aortic Atherosclerosis (ICD10-I70.0). These results will be called to the ordering clinician or representative by the Radiologist Assistant, and communication documented in the PACS or zVision Dashboard. Electronically Signed   By: Suzy Bouchard M.D.   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 Because of the extensive extranodal involvement of the terminal illium, this is considered stage IV disease. He completed his chemotherapy on March 13, 2016.  CT scan results from December 12, 2018 reviewed independently and reported as above with no evidence of recurrent or progressive disease. Patient's most recent colonoscopy on January 04, 2017 removed several benign polyps but otherwise was reported as normal.  No further intervention is needed at this time.  Return to clinic in 1 year with repeat imaging and further evaluation. 2.  Hypertension: Blood pressure is monitor elevated today.  Continue current treatment and evaluation per primary care.    I spent a total of 20 minutes face-to-face with the patient of which greater than 50% of the visit was spent in counseling and coordination of care as detailed above.    Patient expressed understanding and was in agreement with this plan. He also understands that He can call clinic  at any time with any questions, concerns, or complaints.   Cancer Staging Diffuse large B cell lymphoma ([place]) Staging form: Lymphoid Neoplasms, AJCC 6th Edition - Clinical stage from 10/09/2015: Stage IV - Signed by Lloyd Huger, MD on 10/09/2015   Lloyd Huger, MD   12/16/2018 2:51 PM

## 2019-01-20 DIAGNOSIS — Z23 Encounter for immunization: Secondary | ICD-10-CM | POA: Diagnosis not present

## 2019-01-20 DIAGNOSIS — R351 Nocturia: Secondary | ICD-10-CM | POA: Diagnosis not present

## 2019-01-20 DIAGNOSIS — N401 Enlarged prostate with lower urinary tract symptoms: Secondary | ICD-10-CM | POA: Diagnosis not present

## 2019-01-20 DIAGNOSIS — Z79899 Other long term (current) drug therapy: Secondary | ICD-10-CM | POA: Diagnosis not present

## 2019-01-20 DIAGNOSIS — M65311 Trigger thumb, right thumb: Secondary | ICD-10-CM | POA: Diagnosis not present

## 2019-01-20 DIAGNOSIS — I700 Atherosclerosis of aorta: Secondary | ICD-10-CM | POA: Diagnosis not present

## 2019-01-20 DIAGNOSIS — K219 Gastro-esophageal reflux disease without esophagitis: Secondary | ICD-10-CM | POA: Diagnosis not present

## 2019-01-20 DIAGNOSIS — C8513 Unspecified B-cell lymphoma, intra-abdominal lymph nodes: Secondary | ICD-10-CM | POA: Diagnosis not present

## 2019-01-20 DIAGNOSIS — E782 Mixed hyperlipidemia: Secondary | ICD-10-CM | POA: Diagnosis not present

## 2019-01-20 DIAGNOSIS — R7302 Impaired glucose tolerance (oral): Secondary | ICD-10-CM | POA: Diagnosis not present

## 2019-01-20 DIAGNOSIS — E559 Vitamin D deficiency, unspecified: Secondary | ICD-10-CM | POA: Diagnosis not present

## 2019-01-20 DIAGNOSIS — I10 Essential (primary) hypertension: Secondary | ICD-10-CM | POA: Diagnosis not present

## 2019-04-11 DIAGNOSIS — H401131 Primary open-angle glaucoma, bilateral, mild stage: Secondary | ICD-10-CM | POA: Diagnosis not present

## 2019-05-16 DIAGNOSIS — H40003 Preglaucoma, unspecified, bilateral: Secondary | ICD-10-CM | POA: Diagnosis not present

## 2019-06-30 DIAGNOSIS — H401131 Primary open-angle glaucoma, bilateral, mild stage: Secondary | ICD-10-CM | POA: Diagnosis not present

## 2019-07-25 DIAGNOSIS — N401 Enlarged prostate with lower urinary tract symptoms: Secondary | ICD-10-CM | POA: Diagnosis not present

## 2019-07-25 DIAGNOSIS — R5383 Other fatigue: Secondary | ICD-10-CM | POA: Diagnosis not present

## 2019-07-25 DIAGNOSIS — Z0000 Encounter for general adult medical examination without abnormal findings: Secondary | ICD-10-CM | POA: Diagnosis not present

## 2019-07-25 DIAGNOSIS — E782 Mixed hyperlipidemia: Secondary | ICD-10-CM | POA: Diagnosis not present

## 2019-07-25 DIAGNOSIS — I700 Atherosclerosis of aorta: Secondary | ICD-10-CM | POA: Diagnosis not present

## 2019-07-25 DIAGNOSIS — E559 Vitamin D deficiency, unspecified: Secondary | ICD-10-CM | POA: Diagnosis not present

## 2019-07-25 DIAGNOSIS — I10 Essential (primary) hypertension: Secondary | ICD-10-CM | POA: Diagnosis not present

## 2019-07-25 DIAGNOSIS — R7302 Impaired glucose tolerance (oral): Secondary | ICD-10-CM | POA: Diagnosis not present

## 2019-07-25 DIAGNOSIS — K219 Gastro-esophageal reflux disease without esophagitis: Secondary | ICD-10-CM | POA: Diagnosis not present

## 2019-07-25 DIAGNOSIS — R351 Nocturia: Secondary | ICD-10-CM | POA: Diagnosis not present

## 2019-07-25 DIAGNOSIS — Z79899 Other long term (current) drug therapy: Secondary | ICD-10-CM | POA: Diagnosis not present

## 2019-07-25 DIAGNOSIS — C8513 Unspecified B-cell lymphoma, intra-abdominal lymph nodes: Secondary | ICD-10-CM | POA: Diagnosis not present

## 2019-08-04 DIAGNOSIS — H401131 Primary open-angle glaucoma, bilateral, mild stage: Secondary | ICD-10-CM | POA: Diagnosis not present

## 2019-08-29 ENCOUNTER — Other Ambulatory Visit: Payer: Self-pay

## 2019-08-29 ENCOUNTER — Encounter: Payer: Self-pay | Admitting: Urology

## 2019-08-29 ENCOUNTER — Ambulatory Visit: Payer: PPO | Admitting: Urology

## 2019-08-29 DIAGNOSIS — N401 Enlarged prostate with lower urinary tract symptoms: Secondary | ICD-10-CM | POA: Diagnosis not present

## 2019-08-29 DIAGNOSIS — N138 Other obstructive and reflux uropathy: Secondary | ICD-10-CM | POA: Diagnosis not present

## 2019-08-29 MED ORDER — FINASTERIDE 5 MG PO TABS: ORAL_TABLET | ORAL | 3 refills | Status: DC

## 2019-08-29 NOTE — Progress Notes (Signed)
08/22/19 8:58 AM   Zachary Robertson [birth year] [id number]  Referring provider: Ezequiel Kayser, MD [place] [facility] [place],  [place] [address] Chief Complaint  Patient presents with  . Benign Prostatic Hypertrophy    HPI: Zachary Robertson is a 79 y.o. who presents today for his 1 year follow-up for BPH with LUTS.  BPH WITH LUTS  His IPSS score is 6/2 which is an improvement from last years 8/1.  His major complaint today is weak urinary stream. He remains on finasteride. He is satisfied in life despite his urinary issues.  Prostate volume 53 cc on 11/2018 CT.    Patient denies any modifying or aggravating factors.  Patient denies any gross hematuria, dysuria or suprapubic/flank pain.  Patient denies any fevers, chills, nausea or vomiting.  He has a family history of PCa, with his father being diagnosed with prostate cancer.    IPSS    Row Name 08/29/19 0800         International Prostate Symptom Score   How often have you had the sensation of not emptying your bladder?  Less than 1 in 5     How often have you had to urinate less than every two hours?  Less than 1 in 5 times     How often have you found you stopped and started again several times when you urinated?  Not at All     How often have you found it difficult to postpone urination?  Less than 1 in 5 times     How often have you had a weak urinary stream?  Less than half the time     How often have you had to strain to start urination?  Not at All     How many times did you typically get up at night to urinate?  1 Time     Total IPSS Score  6       Quality of Life due to urinary symptoms   If you were to spend the rest of your life with your urinary condition just the way it is now how would you feel about that?  Mostly Satisfied        Score:  1-7 Mild 8-19 Moderate 20-35 Severe  PMH: Past Medical History:  Diagnosis Date  . Acid reflux 06/03/2015  . Adenomatous colon polyp 08/12/2014    Overview:  On 05/25/2011 colonoscopy; repeat 05/2016 (Dr. Gustavo Lah)   . BPH (benign prostatic hyperplasia)   . Cancer ([place])    LYMPHOMA  . Dribbling   . ED (erectile dysfunction)   . Essential (primary) hypertension 06/03/2015  . Fatigue   . GERD (gastroesophageal reflux disease)   . Glaucoma 08/07/2013  . Hesitancy   . HTN (hypertension)   . Hypertension   . Hypogonadism in male   . Nocturia   . Obese   . Urgency-frequency syndrome     Surgical History: Past Surgical History:  Procedure Laterality Date  . BOWEL RESECTION  09/18/2015   Procedure: SMALL BOWEL RESECTION;  Surgeon: Clayburn Pert, MD;  Location: [facility] ORS;  Service: General;;  with anastomosis  . BOWEL RESECTION    . COLONOSCOPY WITH PROPOFOL N/A 01/04/2017   Procedure: COLONOSCOPY WITH PROPOFOL;  Surgeon: Lollie Sails, MD;  Location: [facility] ENDOSCOPY;  Service: Endoscopy;  Laterality: N/A;  . HERNIA REPAIR    . LAPAROSCOPY  09/18/2015   Procedure: LAPAROSCOPY DIAGNOSTIC;  Surgeon: Clayburn Pert, MD;  Location: [facility] ORS;  Service:  General;;  . LAPAROTOMY N/A 09/18/2015   Procedure: EXPLORATORY LAPAROTOMY;  Surgeon: Clayburn Pert, MD;  Location: [facility] ORS;  Service: General;  Laterality: N/A;    Home Medications:  Allergies as of 08/29/2019   No Known Allergies     Medication List       Accurate as of August 29, 2019  8:58 AM. If you have any questions, ask your nurse or doctor.        aspirin EC 81 MG tablet Take 81 mg by mouth daily.   finasteride 5 MG tablet Commonly known as: PROSCAR TAKE ONE TABLET BY MOUTH DAILY   Fish Oil 1200 MG Caps Take 1,200-2,400 mg by mouth See admin instructions. Take 2 capsules (2400mg ) by mouth every morning and 1 capsule by mouth in the evening.   ibuprofen 200 MG tablet Commonly known as: ADVIL Take 200 mg by mouth every 6 (six) hours as needed.   latanoprost 0.005 % ophthalmic solution Commonly known as: XALATAN Place 1 drop into both eyes every morning.    lisinopril-hydrochlorothiazide 10-12.5 MG tablet Commonly known as: ZESTORETIC Take 1 tablet by mouth daily.   pravastatin 20 MG tablet Commonly known as: PRAVACHOL Take 1 tablet by mouth.   timolol 0.5 % ophthalmic solution Commonly known as: TIMOPTIC Apply 1 drop to eye every morning.       Allergies: No Known Allergies  Family History: Family History  Problem Relation Age of Onset  . Prostate cancer Father   . Cancer Father   . Cancer Unknown        family members in general  . Cancer Sister   . Clotting disorder Sister   . Cancer Brother   . Bladder Cancer Neg Hx   . Kidney cancer Neg Hx     Social History:  reports that he has been smoking cigars. He has never used smokeless tobacco. He reports current alcohol use. He reports that he does not use drugs.   Physical Exam: BP (!) 169/81   Pulse 79   Ht 5\' 10"  (1.778 m)   Wt 265 lb (120.2 kg)   BMI 38.02 kg/m   Constitutional:  Alert and oriented, No acute distress. HEENT: [place] AT, mask in place. Trachea midline. Cardiovascular: No clubbing, cyanosis, or edema. Respiratory: Normal respiratory effort, no increased work of breathing. GI: Abdomen is obese, soft, non tender, non distended, no abdominal masses. He does have a midline abdominal scar. Liver and spleen not palpable. No hernias appreciated. Still testing for a cult sample is not indicated. GU: No CVA tenderness.  No bladder fullness or masses.  Patient with uncircumcised phallus. Foreskin easily retracted.  Urethral meatus is patent.  No penile discharge. No penile lesions or rashes. Scrotum without lesions, cysts, rashes and/or edema.  Testicles are located scrotally bilaterally. No masses are appreciated in the testicles. Left and right epididymis are normal. Rectal: Patient with  normal sphincter tone. Anus and perineum without scarring or rashes. No rectal masses are appreciated. Prostate is approximately 40 grams, could only palpate the apex due to body  havidense.  No nodules are appreciated. Seminal vesicles are normal. Lymph: No cervical or inguinal lymphadenopathy. Skin: No rashes, bruises or suspicious lesions. Neurologic: Grossly intact, no focal deficits, moving all 4 extremities. Psychiatric: Normal mood and affect.  Laboratory Data: Results for orders placed or performed in visit on 12/12/18  Lactate dehydrogenase  Result Value Ref Range   LDH 143 98 - 192 U/L  CBC with Differential/Platelet  Result Value  Ref Range   WBC 8.1 4.0 - 10.5 K/uL   RBC 4.48 4.22 - 5.81 MIL/uL   Hemoglobin 14.3 13.0 - 17.0 g/dL   HCT 41.0 39.0 - 52.0 %   MCV 91.5 80.0 - 100.0 fL   MCH 31.9 26.0 - 34.0 pg   MCHC 34.9 30.0 - 36.0 g/dL   RDW 13.0 11.5 - 15.5 %   Platelets 196 150 - 400 K/uL   nRBC 0.0 0.0 - 0.2 %   Neutrophils Relative % 51 %   Neutro Abs 4.1 1.7 - 7.7 K/uL   Lymphocytes Relative 31 %   Lymphs Abs 2.5 0.7 - 4.0 K/uL   Monocytes Relative 13 %   Monocytes Absolute 1.0 0.1 - 1.0 K/uL   Eosinophils Relative 4 %   Eosinophils Absolute 0.4 0.0 - 0.5 K/uL   Basophils Relative 1 %   Basophils Absolute 0.1 0.0 - 0.1 K/uL   Immature Granulocytes 0 %   Abs Immature Granulocytes 0.03 0.00 - 0.07 K/uL  Comprehensive metabolic panel  Result Value Ref Range   Sodium 133 (L) 135 - 145 mmol/L   Potassium 4.4 3.5 - 5.1 mmol/L   Chloride 100 98 - 111 mmol/L   CO2 25 22 - 32 mmol/L   Glucose, Bld 108 (H) 70 - 99 mg/dL   BUN 21 8 - 23 mg/dL   Creatinine, Ser 0.98 0.61 - 1.24 mg/dL   Calcium 9.3 8.9 - 10.3 mg/dL   Total Protein 7.5 6.5 - 8.1 g/dL   Albumin 4.2 3.5 - 5.0 g/dL   AST 24 15 - 41 U/L   ALT 34 0 - 44 U/L   Alkaline Phosphatase 90 38 - 126 U/L   Total Bilirubin 0.7 0.3 - 1.2 mg/dL   GFR calc non Af Amer >60 >60 mL/min   GFR calc Af Amer >60 >60 mL/min   Anion gap 8 5 - 15  I have reviewed the labs.   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  Bowel resection. He continues to feel well and remains asymptomatic. Patient denies any weight loss, fevers, chills, night sweats.  EXAM: CT ABDOMEN AND PELVIS WITH CONTRAST  TECHNIQUE: Multidetector CT imaging of the abdomen and pelvis was performed using the standard protocol following bolus administration of intravenous contrast.  CONTRAST:  144mL OMNIPAQUE IOHEXOL 300 MG/ML  SOLN  COMPARISON:  12/08/2017  FINDINGS: Lower chest: Lung bases are clear.  Hepatobiliary: No focal hepatic lesion. No biliary duct dilatation. Gallbladder is normal. Common bile duct is normal.  Pancreas: Pancreas is normal. No ductal dilatation. No pancreatic inflammation.  Spleen: Normal spleen  Adrenals/urinary tract: Adrenal glands normal. Simple fluid attenuation cyst of the LEFT kidney. Simple fluid attenuation cyst of the RIGHT kidney. Ureters and bladder normal.  Stomach/Bowel: Stomach and duodenum appear normal. Within the proximal jejunum, there is a long 10 cm to 15 cm segment bowel wall thickening. The bowel wall is thickened up to 10 mm (image 47/94). More distally the small bowel is normal. Ileum is normal. The terminal ileum appears normal. No evidence of lymphadenopathy small bowel mesentery.  The ascending transverse colon normal. Mild diverticulosis sigmoid colon rectum appears normal.  Vascular/Lymphatic: Abdominal aorta is normal caliber with atherosclerotic calcification. There is no retroperitoneal or periportal lymphadenopathy. No pelvic lymphadenopathy.  Reproductive: Prostate normal  Other: No free fluid.  Musculoskeletal: No aggressive osseous lesion.  IMPRESSION: 1. Long segment of nonspecific bowel wall thickening  involving the proximal jejunum. Differential includes nonspecific nondistention, enteritis, and lymphoma in patient has history of small bowel involvement. Consider CT or MR enterography for further evaluation. 2. No  lymphadenopathy within the abdomen or pelvis. 3.  Aortic Atherosclerosis (ICD10-I70.0).  These results will be called to the ordering clinician or representative by the Radiologist Assistant, and communication documented in the PACS or zVision Dashboard.   Electronically Signed   By: Suzy Bouchard M.D.   On: 12/12/2018 10:40 I have independently reviewed the films.  See HPI.    Assessment & Plan:    1. BPH with LUTS His IPSS score is 6/2 and it has improved. He will continue consecutive management by not voiding bladder irratants and time voiding. His most bothersome symptom is a weak stream but the patient deferred any further treatment or evaluation for a bladder outlet obstruction at this time. He will continues his finasteride for 5 mg daily and refills were given. He will return in 12 months for IPSS and exam.   [place] [address], [place], [place] [address] [phone]  I, Joneen Boers Peace, am acting as a Education administrator for [employer].  I have reviewed the above documentation for accuracy and completeness, and I agree with the above.    Zara Council, PA-C

## 2019-08-30 DIAGNOSIS — E875 Hyperkalemia: Secondary | ICD-10-CM | POA: Diagnosis not present

## 2019-09-01 ENCOUNTER — Other Ambulatory Visit: Payer: Self-pay | Admitting: Urology

## 2019-09-01 DIAGNOSIS — N138 Other obstructive and reflux uropathy: Secondary | ICD-10-CM

## 2019-10-13 DIAGNOSIS — H401131 Primary open-angle glaucoma, bilateral, mild stage: Secondary | ICD-10-CM | POA: Diagnosis not present

## 2019-11-16 DIAGNOSIS — H40003 Preglaucoma, unspecified, bilateral: Secondary | ICD-10-CM | POA: Diagnosis not present

## 2019-12-13 ENCOUNTER — Other Ambulatory Visit: Payer: Self-pay

## 2019-12-13 ENCOUNTER — Telehealth: Payer: Self-pay | Admitting: *Deleted

## 2019-12-13 DIAGNOSIS — C8339 Diffuse large B-cell lymphoma, extranodal and solid organ sites: Secondary | ICD-10-CM

## 2019-12-13 NOTE — Addendum Note (Signed)
Addended by: Ruthell Rummage A on: 12/13/2019 04:09 PM   Modules accepted: Orders

## 2019-12-13 NOTE — Telephone Encounter (Signed)
CT called asking for a Creatinine order to be placed for this patient that they will draw upstairs for his scan

## 2019-12-13 NOTE — Telephone Encounter (Signed)
Lab order has been added for patient.

## 2019-12-15 ENCOUNTER — Ambulatory Visit
Admission: RE | Admit: 2019-12-15 | Discharge: 2019-12-15 | Disposition: A | Discharge status: Home / Self Care | Payer: PPO | Source: Ambulatory Visit | Attending: Oncology | Admitting: Oncology

## 2019-12-15 ENCOUNTER — Other Ambulatory Visit: Payer: Self-pay

## 2019-12-15 ENCOUNTER — Other Ambulatory Visit
Admission: RE | Admit: 2019-12-15 | Discharge: 2019-12-15 | Disposition: A | Discharge status: Home / Self Care | Payer: PPO | Source: Ambulatory Visit | Attending: Oncology | Admitting: Oncology

## 2019-12-15 DIAGNOSIS — C8330 Diffuse large B-cell lymphoma, unspecified site: Secondary | ICD-10-CM | POA: Diagnosis not present

## 2019-12-15 DIAGNOSIS — K3189 Other diseases of stomach and duodenum: Secondary | ICD-10-CM | POA: Diagnosis not present

## 2019-12-15 DIAGNOSIS — C8339 Diffuse large B-cell lymphoma, extranodal and solid organ sites: Secondary | ICD-10-CM

## 2019-12-15 DIAGNOSIS — I700 Atherosclerosis of aorta: Secondary | ICD-10-CM | POA: Diagnosis not present

## 2019-12-15 DIAGNOSIS — K5730 Diverticulosis of large intestine without perforation or abscess without bleeding: Secondary | ICD-10-CM | POA: Diagnosis not present

## 2019-12-15 LAB — CREATININE, SERUM
Creatinine, Ser: 1.22 mg/dL (ref 0.61–1.24)
GFR calc Af Amer: >60 mL/min (ref >60)
GFR calc non Af Amer: 56 mL/min — ABNORMAL LOW (ref >60)

## 2019-12-15 MED ORDER — IOHEXOL 300 MG/ML  SOLN
125.0000 mL | Freq: Once | INTRAMUSCULAR | Status: AC | PRN
  Administered 2019-12-15: 125 mL via INTRAVENOUS

## 2019-12-15 NOTE — Progress Notes (Signed)
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  He continues to feel well and remains asymptomatic.  He continues to remain active and golfs several times per week.  He has no neurologic complaints.  Patient denies any weight loss, fevers, chills, night sweats,  He denies any abdominal pain or changes in his bowel movements. He has no nausea, vomiting, constipation, or diarrhea.  He denies any chest pain, shortness of breath, cough, or hemoptysis.  He has no urinary complaints.  Patient offers no specific complaints today.  REVIEW OF SYSTEMS:   Review of Systems  Constitutional: Negative for chills, fever, malaise/fatigue and weight loss.  HENT: Negative for congestion and sore throat.   Respiratory: Negative.  Negative for cough, hemoptysis and shortness of breath.   Cardiovascular: Negative.  Negative for chest pain.  Gastrointestinal: Negative.  Negative for abdominal pain, blood in stool, constipation, diarrhea, melena, nausea and vomiting.  Genitourinary: Negative.   Musculoskeletal: Negative.   Skin: Negative.  Negative for rash.  Neurological: Negative.  Negative for dizziness, weakness and headaches.  Endo/Heme/Allergies: Does not bruise/bleed easily.  Psychiatric/Behavioral: Negative.  The patient is not nervous/anxious and does not have insomnia.     As per HPI. Otherwise, a complete review of systems is negative.  PAST MEDICAL HISTORY: Past Medical History:  Diagnosis Date  . Acid reflux 06/03/2015  . Adenomatous colon polyp 08/12/2014   Overview:  On 05/25/2011 colonoscopy; repeat 05/2016  (Dr. Gustavo Lah)   . BPH (benign prostatic hyperplasia)   . Cancer ([place])    LYMPHOMA  . Dribbling   . ED (erectile dysfunction)   . Essential (primary) hypertension 06/03/2015  . Fatigue   . GERD (gastroesophageal reflux disease)   . Glaucoma 08/07/2013  . Hesitancy   . HTN (hypertension)   . Hypertension   . Hypogonadism in male   . Nocturia   . Obese   . Urgency-frequency syndrome     PAST SURGICAL HISTORY: Past Surgical History:  Procedure Laterality Date  . BOWEL RESECTION  09/18/2015   Procedure: SMALL BOWEL RESECTION;  Surgeon: Clayburn Pert, MD;  Location: [facility] ORS;  Service: General;;  with anastomosis  . BOWEL RESECTION    . COLONOSCOPY WITH PROPOFOL N/A 01/04/2017   Procedure: COLONOSCOPY WITH PROPOFOL;  Surgeon: Lollie Sails, MD;  Location[facility] ENDOSCOPY;  Service: Endoscopy;  Laterality: N/A;  . HERNIA REPAIR    . LAPAROSCOPY  09/18/2015   Procedure: LAPAROSCOPY DIAGNOSTIC;  Surgeon: Clayburn Pert, MD;  Location: [facility] ORS;  Service: General;;  . LAPAROTOMY N/A 09/18/2015   Procedure: EXPLORATORY LAPAROTOMY;  Surgeon: Clayburn Pert, MD;  Location: [facility] ORS;  Service: General;  Laterality: N/A;    FAMILY HISTORY: Family History  Problem Relation Age of Onset  . Prostate cancer Father   . Cancer Father   . Cancer Other        family members in general  . Cancer Sister   . Clotting disorder Sister   . Cancer Brother   . Bladder Cancer Neg Hx   . Kidney cancer Neg Hx        ADVANCED DIRECTIVES:    HEALTH MAINTENANCE: Social History  Tobacco Use  . Smoking status: Current Some Day Smoker    Types: Cigars    Last attempt to quit: 04/10/2015    Years since quitting: 4.6  . Smokeless tobacco: Never Used  . Tobacco comment: occasional cigar  Substance Use Topics  . Alcohol use: Yes    Alcohol/week: 0.0 standard drinks    Comment: 1-2 drinks of Liquor Daily.  . Drug use: No     Colonoscopy:  PAP:  Bone density:  Lipid panel:  No Known  Allergies  Current Outpatient Medications  Medication Sig Dispense Refill  . aspirin EC 81 MG tablet Take 81 mg by mouth daily.    . finasteride (PROSCAR) 5 MG tablet TAKE ONE TABLET BY MOUTH DAILY 90 tablet 2  . ibuprofen (ADVIL,MOTRIN) 200 MG tablet Take 200 mg by mouth every 6 (six) hours as needed.    . latanoprost (XALATAN) 0.005 % ophthalmic solution Place 1 drop into both eyes every morning.     Marland Kitchen lisinopril-hydrochlorothiazide (PRINZIDE,ZESTORETIC) 10-12.5 MG tablet Take 1 tablet by mouth daily. 30 tablet 0  . Omega-3 Fatty Acids (FISH OIL) 1200 MG CAPS Take 1,200-2,400 mg by mouth See admin instructions. Take 2 capsules (2400mg ) by mouth every morning and 1 capsule by mouth in the evening.    . pravastatin (PRAVACHOL) 20 MG tablet Take 1 tablet by mouth.    . pravastatin (PRAVACHOL) 40 MG tablet Take 40 mg by mouth daily.    . timolol (TIMOPTIC) 0.5 % ophthalmic solution Apply 1 drop to eye every morning.      No current facility-administered medications for this visit.    OBJECTIVE: Vitals:   12/19/19 1104  BP: (!) 182/77  Pulse: 69  Resp: 20  Temp: (!) 97.4 F (36.3 C)  SpO2: 95%     Body mass index is 38.31 kg/m.    ECOG FS:0 - Asymptomatic  General: Well-developed, well-nourished, no acute distress. Eyes: Pink conjunctiva, anicteric sclera. HEENT: Normocephalic, moist mucous membranes. Lungs: No audible wheezing or coughing. Heart: Regular rate and rhythm. Abdomen: Soft, nontender, no obvious distention. Musculoskeletal: No edema, cyanosis, or clubbing. Neuro: Alert, answering all questions appropriately. Cranial nerves grossly intact. Skin: No rashes or petechiae noted. Psych: Normal affect.   LAB RESULTS:  Lab Results  Component Value Date   NA 133 (L) 12/12/2018   K 4.4 12/12/2018   CL 100 12/12/2018   CO2 25 12/12/2018   GLUCOSE 108 (H) 12/12/2018   BUN 21 12/12/2018   CREATININE 1.22 12/15/2019   CALCIUM 9.3 12/12/2018   PROT 7.5 12/12/2018    ALBUMIN 4.2 12/12/2018   AST 24 12/12/2018   ALT 34 12/12/2018   ALKPHOS 90 12/12/2018   BILITOT 0.7 12/12/2018   GFRNONAA 56 (L) 12/15/2019   GFRAA >60 12/15/2019    Lab Results  Component Value Date   WBC 8.1 12/12/2018   NEUTROABS 4.1 12/12/2018   HGB 14.3 12/12/2018   HCT 41.0 12/12/2018   MCV 91.5 12/12/2018   PLT 196 12/12/2018     STUDIES: CT Abdomen Pelvis W Contrast  Result Date: 12/15/2019 CLINICAL DATA:  Follow-up diffuse large B-cell lymphoma. Previous chemotherapy. EXAM: CT ABDOMEN AND PELVIS WITH CONTRAST TECHNIQUE: Multidetector CT imaging of the abdomen and pelvis was performed using the standard protocol following bolus administration of intravenous contrast. CONTRAST:  150mL OMNIPAQUE IOHEXOL 300 MG/ML  SOLN COMPARISON:  12/12/2018 FINDINGS: Lower Chest: No acute findings. Hepatobiliary: No hepatic masses identified. Gallbladder is unremarkable. No evidence of biliary ductal dilatation.  Pancreas:  No mass or inflammatory changes. Spleen: Within normal limits in size and appearance. Adrenals/Urinary Tract: No masses identified. A few small bilateral renal cysts remains stable. No evidence of ureteral calculi or hydronephrosis. Stomach/Bowel: Mild wall thickening involving the duodenum and jejunum remains stable, and no focal mass is identified. No evidence of bowel obstruction or inflammatory process. Diverticulosis is seen mainly involving the sigmoid colon, however there is no evidence of diverticulitis. Vascular/Lymphatic: No pathologically enlarged lymph nodes. No abdominal aortic aneurysm. Aortic atherosclerosis noted. Reproductive:  No mass or other significant abnormality. Other:  None. Musculoskeletal:  No suspicious bone lesions identified. IMPRESSION: No evidence of recurrent lymphoma. Stable mild wall thickening involving the duodenum and jejunum, which is of doubtful clinical significance. Colonic diverticulosis. No radiographic evidence of diverticulitis. Aortic  Atherosclerosis (ICD10-I70.0). Electronically Signed   By: Marlaine Hind M.D.   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 Because of the extensive extranodal involvement of the terminal illium, this was considered stage IV disease. He completed his chemotherapy on March 13, 2016.  CT scan results from December 15, 2019 reviewed independently and reported as above with no obvious evidence of recurrent or progressive disease.  His most recent colonoscopy on January 04, 2017 removed several benign polyps but otherwise was reported as normal.  He has been instructed to call GI to assess whether a repeat colonoscopy is needed in the near future.  No other intervention is needed.  Return to clinic in 1 year with repeat imaging and further evaluation.  At this point, patient will be nearly 5 years out from completing his treatment and possibly could be discharged from clinic.   2.  Hypertension: Chronic and unchanged.  Continue monitoring and treatment per primary care.  I spent a total of 20 minutes reviewing chart data, face-to-face evaluation with the patient, counseling and coordination of care as detailed above.   Patient expressed understanding and was in agreement with this plan. He also understands that He can call clinic at any time with any questions, concerns, or complaints.   Cancer Staging Diffuse large B cell lymphoma ([place]) Staging form: Lymphoid Neoplasms, AJCC 6th Edition - Clinical stage from 10/09/2015: Stage IV - Signed by Lloyd Huger, MD on 10/09/2015   Lloyd Huger, MD   12/20/2019 10:30 AM

## 2019-12-18 ENCOUNTER — Encounter: Payer: Self-pay | Admitting: Oncology

## 2019-12-18 NOTE — Progress Notes (Signed)
Patient denies any questions or concerns at this time.  

## 2019-12-19 ENCOUNTER — Inpatient Hospital Stay: Payer: PPO | Attending: Oncology | Admitting: Oncology

## 2019-12-19 ENCOUNTER — Other Ambulatory Visit: Payer: Self-pay

## 2019-12-19 ENCOUNTER — Encounter: Payer: Self-pay | Admitting: Oncology

## 2019-12-19 VITALS — BP 182/77 | HR 69 | Temp 97.4°F | Resp 20 | Ht 70.0 in | Wt 267.0 lb

## 2019-12-19 DIAGNOSIS — Z8042 Family history of malignant neoplasm of prostate: Secondary | ICD-10-CM | POA: Insufficient documentation

## 2019-12-19 DIAGNOSIS — F17290 Nicotine dependence, other tobacco product, uncomplicated: Secondary | ICD-10-CM | POA: Diagnosis not present

## 2019-12-19 DIAGNOSIS — I10 Essential (primary) hypertension: Secondary | ICD-10-CM | POA: Diagnosis not present

## 2019-12-19 DIAGNOSIS — C8339 Diffuse large B-cell lymphoma, extranodal and solid organ sites: Secondary | ICD-10-CM

## 2019-12-19 DIAGNOSIS — Z8572 Personal history of non-Hodgkin lymphomas: Secondary | ICD-10-CM | POA: Insufficient documentation

## 2019-12-19 DIAGNOSIS — Z809 Family history of malignant neoplasm, unspecified: Secondary | ICD-10-CM | POA: Insufficient documentation

## 2020-01-31 DIAGNOSIS — C8513 Unspecified B-cell lymphoma, intra-abdominal lymph nodes: Secondary | ICD-10-CM | POA: Diagnosis not present

## 2020-01-31 DIAGNOSIS — Z23 Encounter for immunization: Secondary | ICD-10-CM | POA: Diagnosis not present

## 2020-01-31 DIAGNOSIS — I700 Atherosclerosis of aorta: Secondary | ICD-10-CM | POA: Diagnosis not present

## 2020-01-31 DIAGNOSIS — E559 Vitamin D deficiency, unspecified: Secondary | ICD-10-CM | POA: Diagnosis not present

## 2020-01-31 DIAGNOSIS — Z79899 Other long term (current) drug therapy: Secondary | ICD-10-CM | POA: Diagnosis not present

## 2020-01-31 DIAGNOSIS — N401 Enlarged prostate with lower urinary tract symptoms: Secondary | ICD-10-CM | POA: Diagnosis not present

## 2020-01-31 DIAGNOSIS — L853 Xerosis cutis: Secondary | ICD-10-CM | POA: Diagnosis not present

## 2020-01-31 DIAGNOSIS — R7302 Impaired glucose tolerance (oral): Secondary | ICD-10-CM | POA: Diagnosis not present

## 2020-01-31 DIAGNOSIS — I10 Essential (primary) hypertension: Secondary | ICD-10-CM | POA: Diagnosis not present

## 2020-01-31 DIAGNOSIS — E782 Mixed hyperlipidemia: Secondary | ICD-10-CM | POA: Diagnosis not present

## 2020-01-31 DIAGNOSIS — D692 Other nonthrombocytopenic purpura: Secondary | ICD-10-CM | POA: Diagnosis not present

## 2020-05-09 DIAGNOSIS — H40003 Preglaucoma, unspecified, bilateral: Secondary | ICD-10-CM | POA: Diagnosis not present

## 2020-05-16 DIAGNOSIS — H40003 Preglaucoma, unspecified, bilateral: Secondary | ICD-10-CM | POA: Diagnosis not present

## 2020-08-28 NOTE — Progress Notes (Signed)
08/22/19 8:41 AM   Zachary Robertson [birth year] [id number]  Referring provider: Ezequiel Kayser, MD [facility] [place],  [place] [address] Chief Complaint  Patient presents with   Benign Prostatic Hypertrophy   Urological history: 1. BPH with LU TS -PSA 0.5 in 2018 -I PSS 5/1 -prostate volume 34 cc on 11/2019 CT -managed with finasteride 5 mg daily   2. Family history of prostate cancer -father with prostate cancer  3. Bilateral renal cysts -largest cyst at 4 cm on left kidney on 11/2019 CT   HPI: Zachary Robertson is a 80 y.o. who presents today for a one year follow up.    His only urinary complaint today is an occasional weak stream.    Patient denies any modifying or aggravating factors.  Patient denies any gross hematuria, dysuria or suprapubic/flank pain.  Patient denies any fevers, chills, nausea or vomiting.       IPSS     Row Name 08/29/20 0800         International Prostate Symptom Score   How often have you had the sensation of not emptying your bladder? Not at All     How often have you had to urinate less than every two hours? Less than 1 in 5 times     How often have you found you stopped and started again several times when you urinated? Less than 1 in 5 times     How often have you found it difficult to postpone urination? Not at All     How often have you had a weak urinary stream? Less than 1 in 5 times     How often have you had to strain to start urination? Less than 1 in 5 times     How many times did you typically get up at night to urinate? 1 Time     Total IPSS Score 5           Quality of Life due to urinary symptoms     If you were to spend the rest of your life with your urinary condition just the way it is now how would you feel about that? Pleased             Score:  1-7 Mild 8-19 Moderate 20-35 Severe  PMH: Past Medical History:  Diagnosis Date   Acid reflux 06/03/2015   Adenomatous colon polyp  08/12/2014   Overview:  On 05/25/2011 colonoscopy; repeat 05/2016 (Dr. Gustavo Lah)    BPH (benign prostatic hyperplasia)    Cancer ([place])    LYMPHOMA   Dribbling    ED (erectile dysfunction)    Essential (primary) hypertension 06/03/2015   Fatigue    GERD (gastroesophageal reflux disease)    Glaucoma 08/07/2013   Hesitancy    HTN (hypertension)    Hypertension    Hypogonadism in male    Nocturia    Obese    Urgency-frequency syndrome     Surgical History: Past Surgical History:  Procedure Laterality Date   BOWEL RESECTION  09/18/2015   Procedure: SMALL BOWEL RESECTION;  Surgeon: Clayburn Pert, MD;  Location: [facility] ORS;  Service: General;;  with anastomosis   BOWEL RESECTION     COLONOSCOPY WITH PROPOFOL N/A 01/04/2017   Procedure: COLONOSCOPY WITH PROPOFOL;  Surgeon: Lollie Sails, MD;  Location: [facility] ENDOSCOPY;  Service: Endoscopy;  Laterality: N/A;   HERNIA REPAIR     LAPAROSCOPY  09/18/2015   Procedure: LAPAROSCOPY DIAGNOSTIC;  Surgeon:  Clayburn Pert, MD;  Location: [facility] ORS;  Service: General;;   LAPAROTOMY N/A 09/18/2015   Procedure: EXPLORATORY LAPAROTOMY;  Surgeon: Clayburn Pert, MD;  Location: [facility] ORS;  Service: General;  Laterality: N/A;    Home Medications:  Allergies as of 08/29/2020   No Known Allergies      Medication List        Accurate as of August 29, 2020  8:41 AM. If you have any questions, ask your nurse or doctor.          aspirin EC 81 MG tablet Take 81 mg by mouth daily.   finasteride 5 MG tablet Commonly known as: PROSCAR TAKE ONE TABLET BY MOUTH DAILY   Fish Oil 1200 MG Caps Take 1,200-2,400 mg by mouth See admin instructions. Take 2 capsules (2442m) by mouth every morning and 1 capsule by mouth in the evening.   ibuprofen 200 MG tablet Commonly known as: ADVIL Take 200 mg by mouth every 6 (six) hours as needed.   latanoprost 0.005 % ophthalmic solution Commonly known as: XALATAN Place 1 drop into both eyes every morning.    lisinopril-hydrochlorothiazide 10-12.5 MG tablet Commonly known as: ZESTORETIC Take 1 tablet by mouth daily.   pravastatin 20 MG tablet Commonly known as: PRAVACHOL Take 1 tablet by mouth.   pravastatin 40 MG tablet Commonly known as: PRAVACHOL Take 40 mg by mouth daily.   timolol 0.5 % ophthalmic solution Commonly known as: TIMOPTIC Apply 1 drop to eye every morning.        Allergies: No Known Allergies  Family History: Family History  Problem Relation Age of Onset   Prostate cancer Father    Cancer Father    Cancer Other        family members in general   Cancer Sister    Clotting disorder Sister    Cancer Brother    Bladder Cancer Neg Hx    Kidney cancer Neg Hx     Social History:  reports that he has been smoking cigars. He has never used smokeless tobacco. He reports current alcohol use. He reports that he does not use drugs.   Physical Exam: BP (!) 151/82   Pulse 66   Ht _0  (1.778 m)   Wt 267 lb (121.1 kg)   BMI 38.31 kg/m   Constitutional:  Well nourished. Alert and oriented, No acute distress. HEENT: [place] AT, mask in place  Trachea midline Cardiovascular: No clubbing, cyanosis, or edema. Respiratory: Normal respiratory effort, no increased work of breathing. GU: No CVA tenderness.  No bladder fullness or masses.  Patient with uncircumcised phallus.  Foreskin easily retracted.  Urethral meatus is patent.  No penile discharge. No penile lesions or rashes. Scrotum without lesions, cysts, rashes and/or edema.  Testicles are located scrotally bilaterally. No masses are appreciated in the testicles. Left and right epididymis are normal. Rectal: Patient with  normal sphincter tone. Anus and perineum without scarring or rashes. No rectal masses are appreciated. Prostate is approximately 50+  grams, could only palpate the apex, no nodules are appreciated. Seminal vesicles could not be palpated Neurologic: Grossly intact, no focal deficits, moving all 4  extremities. Psychiatric: Normal mood and affect.  Laboratory Data: Hemoglobin A1C 4.2 - 5.6 % 6.4 High    Average Blood Glucose (Calc) mg/dL [place]  Narrative Performed by [facility]- [facility] Normal Range:    4.2 - 5.6%  Increased Risk:  5.7 -  6.4%  Diabetes:        >= 6.5%  Glycemic Control for adults with diabetes:  <7%   Specimen Collected: 01/31/20 08:35 Last Resulted: 01/31/20 13:41  Received From: [facility]  Result Received: 08/26/20 14:37   Cholesterol, Total 100 - 200 mg/dL 164   Triglyceride 35 - 199 mg/dL 147   HDL (High Density Lipoprotein) Cholesterol 29.0 - 71.0 mg/dL 44.4   LDL Calculated 0 - 130 mg/dL 90   VLDL Cholesterol mg/dL 29   Cholesterol/HDL Ratio  3.7   Resulting Agency  [facility]  Specimen Collected: 01/31/20 08:35 Last Resulted: 01/31/20 12:20  Received From: [facility]  Result Received: 08/26/20 14:37   Creatinine 0.7 - 1.3 mg/dL 1.1   Glomerular Filtration Rate (eGFR), MDRD Estimate >60 mL/min/1.73sq m 65   Resulting Agency  [facility] - [facility]  Specimen Collected: 01/31/20 08:35 Last Resulted: 01/31/20 12:20  Received From: [facility]  Result Received: 08/26/20 14:37  I have reviewed the labs.   Pertinent Imaging: CLINICAL DATA:  Follow-up diffuse large B-cell lymphoma. Previous chemotherapy.   EXAM: CT ABDOMEN AND PELVIS WITH CONTRAST   TECHNIQUE: Multidetector CT imaging of the abdomen and pelvis was performed using the standard protocol following bolus administration of intravenous contrast.   CONTRAST:  163m OMNIPAQUE IOHEXOL 300 MG/ML  SOLN   COMPARISON:  12/12/2018   FINDINGS: Lower Chest: No acute findings.   Hepatobiliary: No hepatic masses identified. Gallbladder is unremarkable. No evidence of biliary ductal dilatation.   Pancreas:  No mass or inflammatory changes.   Spleen: Within  normal limits in size and appearance.   Adrenals/Urinary Tract: No masses identified. A few small bilateral renal cysts remains stable. No evidence of ureteral calculi or hydronephrosis.   Stomach/Bowel: Mild wall thickening involving the duodenum and jejunum remains stable, and no focal mass is identified. No evidence of bowel obstruction or inflammatory process. Diverticulosis is seen mainly involving the sigmoid colon, however there is no evidence of diverticulitis.   Vascular/Lymphatic: No pathologically enlarged lymph nodes. No abdominal aortic aneurysm. Aortic atherosclerosis noted.   Reproductive:  No mass or other significant abnormality.   Other:  None.   Musculoskeletal:  No suspicious bone lesions identified.   IMPRESSION: No evidence of recurrent lymphoma.   Stable mild wall thickening involving the duodenum and jejunum, which is of doubtful clinical significance.   Colonic diverticulosis. No radiographic evidence of diverticulitis.   Aortic Atherosclerosis (ICD10-I70.0).     Electronically Signed   By: JMarlaine HindM.D.   On: 12/15/2019 11:45  I have independently reviewed the films.  See HPI.    Assessment & Plan:    1. BPH with LUTS -continue finasteride 5 mg daily - refills given -RTC in one year for I PSS and exam   [address] [place] [phone] SZara Council PA-C

## 2020-08-29 ENCOUNTER — Other Ambulatory Visit: Payer: Self-pay

## 2020-08-29 ENCOUNTER — Encounter: Payer: Self-pay | Admitting: Urology

## 2020-08-29 ENCOUNTER — Ambulatory Visit: Payer: PPO | Admitting: Urology

## 2020-08-29 VITALS — BP 151/82 | HR 66 | Ht 70.0 in | Wt 267.0 lb

## 2020-08-29 DIAGNOSIS — N401 Enlarged prostate with lower urinary tract symptoms: Secondary | ICD-10-CM

## 2020-08-29 DIAGNOSIS — N138 Other obstructive and reflux uropathy: Secondary | ICD-10-CM | POA: Diagnosis not present

## 2020-08-29 MED ORDER — FINASTERIDE 5 MG PO TABS: 5.0000 mg | ORAL_TABLET | Freq: Every day | ORAL | 3 refills | Status: DC

## 2020-11-12 DIAGNOSIS — H40003 Preglaucoma, unspecified, bilateral: Secondary | ICD-10-CM | POA: Diagnosis not present

## 2020-12-14 NOTE — Progress Notes (Signed)
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  He continues to feel well and remains asymptomatic.  He continues to remain active and plans to travel to the beach in the next several days.  He has no neurologic complaints.  Patient denies any weight loss, fevers, chills, night sweats,  He denies any abdominal pain or changes in his bowel movements. He has no nausea, vomiting, constipation, or diarrhea.  He denies any chest pain, shortness of breath, cough, or hemoptysis.  He has no urinary complaints.  Patient feels at his baseline and offers no specific complaints today.  REVIEW OF SYSTEMS:   Review of Systems  Constitutional:  Negative for chills, fever, malaise/fatigue and weight loss.  HENT:  Negative for congestion and sore throat.   Respiratory: Negative.  Negative for cough, hemoptysis and shortness of breath.   Cardiovascular: Negative.  Negative for chest pain.  Gastrointestinal: Negative.  Negative for abdominal pain, blood in stool, constipation, diarrhea, melena, nausea and vomiting.  Genitourinary: Negative.   Musculoskeletal: Negative.   Skin: Negative.  Negative for rash.  Neurological: Negative.  Negative for dizziness, weakness and headaches.  Endo/Heme/Allergies:  Does not bruise/bleed easily.  Psychiatric/Behavioral: Negative.  The patient is not nervous/anxious and does not have insomnia.    As per HPI. Otherwise, a complete review of systems is negative.  PAST MEDICAL HISTORY: Past Medical History:  Diagnosis Date   Acid reflux 06/03/2015   Adenomatous colon polyp 08/12/2014   Overview:  On  05/25/2011 colonoscopy; repeat 05/2016 (Dr. Gustavo Lah)    BPH (benign prostatic hyperplasia)    Cancer ([place])    LYMPHOMA   Dribbling    ED (erectile dysfunction)    Essential (primary) hypertension 06/03/2015   Fatigue    GERD (gastroesophageal reflux disease)    Glaucoma 08/07/2013   Hesitancy    HTN (hypertension)    Hypertension    Hypogonadism in male    Nocturia    Obese    Urgency-frequency syndrome     PAST SURGICAL HISTORY: Past Surgical History:  Procedure Laterality Date   BOWEL RESECTION  09/18/2015   Procedure: SMALL BOWEL RESECTION;  Surgeon: Clayburn Pert, MD;  Location: [facility] ORS;  Service: General;;  with anastomosis   BOWEL RESECTION     COLONOSCOPY WITH PROPOFOL N/A 01/04/2017   Procedure: COLONOSCOPY WITH PROPOFOL;  Surgeon: Lollie Sails, MD;  Location: [facility] ENDOSCOPY;  Service: Endoscopy;  Laterality: N/A;   HERNIA REPAIR     LAPAROSCOPY  09/18/2015   Procedure: LAPAROSCOPY DIAGNOSTIC;  Surgeon: Clayburn Pert, MD;  Location: [facility] ORS;  Service: General;;   LAPAROTOMY N/A 09/18/2015   Procedure: EXPLORATORY LAPAROTOMY;  Surgeon: Clayburn Pert, MD;  Location: [facility] ORS;  Service: General;  Laterality: N/A;    FAMILY HISTORY: Family History  Problem Relation Age of Onset   Prostate cancer Father    Cancer Father    Cancer Other        family members in general   Cancer Sister    Clotting disorder Sister    Cancer Brother    Bladder Cancer Neg Hx    Kidney cancer Neg Hx  ADVANCED DIRECTIVES:    HEALTH MAINTENANCE: Social History   Tobacco Use   Smoking status: Some Days    Types: Cigars    Last attempt to quit: 04/10/2015    Years since quitting: 5.7   Smokeless tobacco: Never   Tobacco comments:    occasional cigar  Substance Use Topics   Alcohol use: Yes    Alcohol/week: 0.0 standard drinks    Comment: 1-2 drinks of Liquor Daily.   Drug use: No     Colonoscopy:  PAP:  Bone density:  Lipid panel:  No Known  Allergies  Current Outpatient Medications  Medication Sig Dispense Refill   aspirin EC 81 MG tablet Take 81 mg by mouth daily.     finasteride (PROSCAR) 5 MG tablet Take 1 tablet (5 mg total) by mouth daily. 90 tablet 3   ibuprofen (ADVIL,MOTRIN) 200 MG tablet Take 200 mg by mouth every 6 (six) hours as needed.     latanoprost (XALATAN) 0.005 % ophthalmic solution Place 1 drop into both eyes every morning.      Omega-3 Fatty Acids (FISH OIL) 1200 MG CAPS Take 1,200-2,400 mg by mouth See admin instructions. Take 2 capsules (2400mg ) by mouth every morning and 1 capsule by mouth in the evening.     pravastatin (PRAVACHOL) 40 MG tablet Take 40 mg by mouth daily.     timolol (TIMOPTIC) 0.5 % ophthalmic solution Apply 1 drop to eye every morning.      lisinopril-hydrochlorothiazide (PRINZIDE,ZESTORETIC) 10-12.5 MG tablet Take 1 tablet by mouth daily. 30 tablet 0   pravastatin (PRAVACHOL) 20 MG tablet Take 1 tablet by mouth. (Patient not taking: Reported on 12/18/2020)     No current facility-administered medications for this visit.    OBJECTIVE: Vitals:   12/18/20 1134  BP: (!) 180/81  Pulse: 63  Resp: 18  Temp: 97.7 F (36.5 C)  SpO2: 100%     Body mass index is 38.17 kg/m.    ECOG FS:0 - Asymptomatic  General: Well-developed, well-nourished, no acute distress. Eyes: Pink conjunctiva, anicteric sclera. HEENT: Normocephalic, moist mucous membranes. Lungs: No audible wheezing or coughing. Heart: Regular rate and rhythm. Abdomen: Soft, nontender, no obvious distention. Musculoskeletal: No edema, cyanosis, or clubbing. Neuro: Alert, answering all questions appropriately. Cranial nerves grossly intact. Skin: No rashes or petechiae noted. Psych: Normal affect.   LAB RESULTS:  Lab Results  Component Value Date   NA 133 (L) 12/12/2018   K 4.4 12/12/2018   CL 100 12/12/2018   CO2 25 12/12/2018   GLUCOSE 108 (H) 12/12/2018   BUN 21 12/12/2018   CREATININE 1.22 12/15/2019   CALCIUM  9.3 12/12/2018   PROT 7.5 12/12/2018   ALBUMIN 4.2 12/12/2018   AST 24 12/12/2018   ALT 34 12/12/2018   ALKPHOS 90 12/12/2018   BILITOT 0.7 12/12/2018   GFRNONAA 56 (L) 12/15/2019   GFRAA >60 12/15/2019    Lab Results  Component Value Date   WBC 8.1 12/12/2018   NEUTROABS 4.1 12/12/2018   HGB 14.3 12/12/2018   HCT 41.0 12/12/2018   MCV 91.5 12/12/2018   PLT 196 12/12/2018     STUDIES: CT Abdomen Pelvis W Contrast  Result Date: 12/17/2020 CLINICAL DATA:  Stage IV diffuse large B-cell Lymphoma with extranodal involvement of terminal ilium. EXAM: CT ABDOMEN AND PELVIS WITH CONTRAST TECHNIQUE: Multidetector CT imaging of the abdomen and pelvis was performed using the standard protocol following bolus administration of intravenous contrast. CONTRAST:  163mL OMNIPAQUE IOHEXOL 350 MG/ML SOLN  COMPARISON:  CT Abdomen and Pelvis dated 12/15/2019. FINDINGS: Lower chest: Lung bases are clear. Hepatobiliary: Liver is within normal limits. Gallbladder is unremarkable. No intrahepatic or extrahepatic ductal dilatation. Pancreas: Within normal limits. Spleen: Within normal limits. Adrenals/Urinary Tract: Adrenal glands are within normal limits. Bilateral renal cysts, measuring up to 3.6 cm in the left upper pole. No hydronephrosis. Bladder is within normal limits. Stomach/Bowel: Stomach is within normal limits. No evidence of bowel obstruction. Mild duodenal wall thickening (series 6/image 42), non masslike and chronic. Normal appendix (series 6/image 58). Mild left colonic diverticulosis, without evidence of diverticulitis. Vascular/Lymphatic: No evidence of abdominal aortic aneurysm. Atherosclerotic calcifications of the abdominal aorta and branch vessels. No suspicious abdominopelvic lymphadenopathy. Reproductive: Prostate is unremarkable. Other: No abdominopelvic ascites. Musculoskeletal: Degenerative changes of the visualized thoracolumbar spine. IMPRESSION: No evidence of active lymphoma. Additional  stable ancillary findings as above. Electronically Signed   By: Julian Hy M.D.   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 Because of the extensive extranodal involvement of the terminal illium, this was considered stage IV disease. He completed his chemotherapy on March 13, 2016.  CT scan results from December 16, 2020 reviewed independently and reported as above with no obvious evidence of recurrent or progressive disease.  His most recent colonoscopy on January 04, 2017 removed several benign polyps but otherwise was reported as normal.  Patient expressed understanding he should have a repeat colonoscopy in the near future.  Patient is now nearly 5 years removed from completing his treatment with no evidence of disease, therefore can be discharged from clinic.  No further follow-up is necessary.  Please refer patient back if there are any questions or concerns. 2.  Hypertension: Chronic and unchanged.  Continue monitoring and treatment per primary care.  I spent a total of 20 minutes reviewing chart data, face-to-face evaluation with the patient, counseling and coordination of care as detailed above.   Patient expressed understanding and was in agreement with this plan. He also understands that He can call clinic at any time with any questions, concerns, or complaints.   Cancer Staging Diffuse large B-cell lymphoma of solid organ excluding spleen [facility]) Staging form: Lymphoid Neoplasms, AJCC 6th Edition - Clinical stage from 10/09/2015: Stage IV - Signed by Lloyd Huger, MD on 10/09/2015 Specimen type: Excision   Lloyd Huger, MD   12/20/2020 2:40 PM

## 2020-12-16 ENCOUNTER — Inpatient Hospital Stay: Payer: PPO | Attending: Oncology

## 2020-12-16 ENCOUNTER — Ambulatory Visit
Admission: RE | Admit: 2020-12-16 | Discharge: 2020-12-16 | Disposition: A | Discharge status: Home / Self Care | Payer: PPO | Source: Ambulatory Visit | Attending: Oncology | Admitting: Oncology

## 2020-12-16 DIAGNOSIS — Z7982 Long term (current) use of aspirin: Secondary | ICD-10-CM | POA: Insufficient documentation

## 2020-12-16 DIAGNOSIS — N281 Cyst of kidney, acquired: Secondary | ICD-10-CM | POA: Diagnosis not present

## 2020-12-16 DIAGNOSIS — I10 Essential (primary) hypertension: Secondary | ICD-10-CM | POA: Insufficient documentation

## 2020-12-16 DIAGNOSIS — C8339 Diffuse large B-cell lymphoma, extranodal and solid organ sites: Secondary | ICD-10-CM | POA: Insufficient documentation

## 2020-12-16 DIAGNOSIS — K3189 Other diseases of stomach and duodenum: Secondary | ICD-10-CM | POA: Diagnosis not present

## 2020-12-16 DIAGNOSIS — Z9049 Acquired absence of other specified parts of digestive tract: Secondary | ICD-10-CM | POA: Insufficient documentation

## 2020-12-16 DIAGNOSIS — N400 Enlarged prostate without lower urinary tract symptoms: Secondary | ICD-10-CM | POA: Insufficient documentation

## 2020-12-16 DIAGNOSIS — K5730 Diverticulosis of large intestine without perforation or abscess without bleeding: Secondary | ICD-10-CM | POA: Diagnosis not present

## 2020-12-16 DIAGNOSIS — Z79899 Other long term (current) drug therapy: Secondary | ICD-10-CM | POA: Insufficient documentation

## 2020-12-16 DIAGNOSIS — C8330 Diffuse large B-cell lymphoma, unspecified site: Secondary | ICD-10-CM | POA: Insufficient documentation

## 2020-12-16 MED ORDER — IOHEXOL 350 MG/ML SOLN
100.0000 mL | Freq: Once | INTRAVENOUS | Status: AC | PRN
  Administered 2020-12-16: 100 mL via INTRAVENOUS

## 2020-12-18 ENCOUNTER — Other Ambulatory Visit: Payer: Self-pay

## 2020-12-18 ENCOUNTER — Inpatient Hospital Stay (HOSPITAL_BASED_OUTPATIENT_CLINIC_OR_DEPARTMENT_OTHER): Payer: PPO | Admitting: Oncology

## 2020-12-18 VITALS — BP 180/81 | HR 63 | Temp 97.7°F | Resp 18 | Wt 266.0 lb

## 2020-12-18 DIAGNOSIS — Z9049 Acquired absence of other specified parts of digestive tract: Secondary | ICD-10-CM | POA: Diagnosis not present

## 2020-12-18 DIAGNOSIS — Z79899 Other long term (current) drug therapy: Secondary | ICD-10-CM | POA: Diagnosis not present

## 2020-12-18 DIAGNOSIS — Z7982 Long term (current) use of aspirin: Secondary | ICD-10-CM | POA: Diagnosis not present

## 2020-12-18 DIAGNOSIS — C8339 Diffuse large B-cell lymphoma, extranodal and solid organ sites: Secondary | ICD-10-CM | POA: Diagnosis not present

## 2020-12-18 DIAGNOSIS — C8330 Diffuse large B-cell lymphoma, unspecified site: Secondary | ICD-10-CM | POA: Diagnosis not present

## 2020-12-18 DIAGNOSIS — N400 Enlarged prostate without lower urinary tract symptoms: Secondary | ICD-10-CM | POA: Diagnosis not present

## 2020-12-18 DIAGNOSIS — I10 Essential (primary) hypertension: Secondary | ICD-10-CM | POA: Diagnosis not present

## 2020-12-18 NOTE — Progress Notes (Signed)
Pt has no concerns at this time. 

## 2021-08-22 ENCOUNTER — Other Ambulatory Visit: Payer: Self-pay | Admitting: Urology

## 2021-08-22 DIAGNOSIS — N138 Other obstructive and reflux uropathy: Secondary | ICD-10-CM

## 2021-08-28 NOTE — Progress Notes (Signed)
08/22/19 8:26 AM   Zachary Robertson 1940/09/10 [id number]  Referring provider: Ezequiel Kayser, MD [address] [place],  [place] [address]  Urological history: 1. BPH with LU TS -PSA 0.5 in 2018 -I PSS 4/1 -prostate volume 34 cc on 11/2019 CT -managed with finasteride 5 mg daily   2. Family history of prostate cancer -father with prostate cancer  3. Bilateral renal cysts -largest cyst at 4 cm on left kidney on 11/2019 CT  Chief Complaint  Patient presents with   Benign Prostatic Hypertrophy     HPI: Zachary Robertson is a 81 y.o. who presents today for a one year follow up.    He has no urinary complaints at this time.  Patient denies any modifying or aggravating factors.  Patient denies any gross hematuria, dysuria or suprapubic/flank pain.  Patient denies any fevers, chills, nausea or vomiting.      IPSS     Row Name 08/29/21 0800         International Prostate Symptom Score   How often have you had the sensation of not emptying your bladder? Not at All     How often have you had to urinate less than every two hours? Less than 1 in 5 times     How often have you found you stopped and started again several times when you urinated? Not at All     How often have you found it difficult to postpone urination? Less than 1 in 5 times     How often have you had a weak urinary stream? Less than 1 in 5 times     How often have you had to strain to start urination? Not at All     How many times did you typically get up at night to urinate? 1 Time     Total IPSS Score 4       Quality of Life due to urinary symptoms   If you were to spend the rest of your life with your urinary condition just the way it is now how would you feel about that? Pleased               Score:  1-7 Mild 8-19 Moderate 20-35 Severe  PMH: Past Medical History:  Diagnosis Date   Acid reflux 06/03/2015   Adenomatous colon polyp 08/12/2014   Overview:  On 05/25/2011 colonoscopy; repeat  05/2016 (Dr. Gustavo Lah)    BPH (benign prostatic hyperplasia)    Cancer ([place])    LYMPHOMA   Dribbling    ED (erectile dysfunction)    Essential (primary) hypertension 06/03/2015   Fatigue    GERD (gastroesophageal reflux disease)    Glaucoma 08/07/2013   Hesitancy    HTN (hypertension)    Hypertension    Hypogonadism in male    Nocturia    Obese    Urgency-frequency syndrome     Surgical History: Past Surgical History:  Procedure Laterality Date   BOWEL RESECTION  09/18/2015   Procedure: SMALL BOWEL RESECTION;  Surgeon: Clayburn Pert, MD;  Location: [facility] ORS;  Service: General;;  with anastomosis   BOWEL RESECTION     COLONOSCOPY WITH PROPOFOL N/A 01/04/2017   Procedure: COLONOSCOPY WITH PROPOFOL;  Surgeon: Lollie Sails, MD;  Location: [facility] ENDOSCOPY;  Service: Endoscopy;  Laterality: N/A;   HERNIA REPAIR     LAPAROSCOPY  09/18/2015   Procedure: LAPAROSCOPY DIAGNOSTIC;  Surgeon: Clayburn Pert, MD;  Location: [facility] ORS;  Service: General;;  LAPAROTOMY N/A 09/18/2015   Procedure: EXPLORATORY LAPAROTOMY;  Surgeon: Clayburn Pert, MD;  Location: [facility] ORS;  Service: General;  Laterality: N/A;    Home Medications:  Allergies as of 08/29/2021   No Known Allergies      Medication List        Accurate as of August 29, 2021  8:26 AM. If you have any questions, ask your nurse or doctor.          aspirin EC 81 MG tablet Take 81 mg by mouth daily.   CHOLECALCIFEROL PO Take by mouth.   cyanocobalamin 1000 MCG tablet Take 2 tablets daily for 2 weeks, then reduce to 1 tablet daily thereafter for Vitamin B12 Deficiency.   finasteride 5 MG tablet Commonly known as: PROSCAR TAKE ONE TABLET BY MOUTH DAILY   Fish Oil 1200 MG Caps Take 1,200-2,400 mg by mouth See admin instructions. Take 2 capsules (2472m) by mouth every morning and 1 capsule by mouth in the evening.   ibuprofen 200 MG tablet Commonly known as: ADVIL Take 200 mg by mouth every 6 (six) hours as needed.    latanoprost 0.005 % ophthalmic solution Commonly known as: XALATAN Place 1 drop into both eyes every morning.   lisinopril-hydrochlorothiazide 10-12.5 MG tablet Commonly known as: ZESTORETIC Take 1 tablet by mouth daily.   pravastatin 20 MG tablet Commonly known as: PRAVACHOL Take 1 tablet by mouth.   pravastatin 40 MG tablet Commonly known as: PRAVACHOL Take 40 mg by mouth daily.   timolol 0.5 % ophthalmic solution Commonly known as: TIMOPTIC Apply 1 drop to eye every morning.        Allergies: No Known Allergies  Family History: Family History  Problem Relation Age of Onset   Prostate cancer Father    Cancer Father    Cancer Other        family members in general   Cancer Sister    Clotting disorder Sister    Cancer Brother    Bladder Cancer Neg Hx    Kidney cancer Neg Hx     Social History:  reports that he has been smoking cigars. He has never used smokeless tobacco. He reports current alcohol use. He reports that he does not use drugs.   Physical Exam: BP (!) 175/84   Pulse 68   Ht 5' 10"  (1.778 m)   Wt 267 lb (121.1 kg)   BMI 38.31 kg/m   Constitutional:  Well nourished. Alert and oriented, No acute distress. HEENT:  AT, moist mucus membranes.  Trachea midline Cardiovascular: No clubbing, cyanosis, or edema. Respiratory: Normal respiratory effort, no increased work of breathing. GU: No CVA tenderness.  No bladder fullness or masses.  Patient with uncircumcised phallus.  Foreskin easily retracted  Urethral meatus is patent.  No penile discharge. No penile lesions or rashes. Scrotum without lesions, cysts, rashes and/or edema.  Testicles are located scrotally bilaterally. No masses are appreciated in the testicles. Left and right epididymis are normal. Rectal: Patient with  normal sphincter tone. Anus and perineum without scarring or rashes. No rectal masses are appreciated. Prostate is approximately 60 + grams, could only palpate the apex, no nodules  are appreciated. Seminal vesicles could not be palpated Neurologic: Grossly intact, no focal deficits, moving all 4 extremities. Psychiatric: Normal mood and affect.   Laboratory Data: WBC (White Blood Cell Count) 4.1 - 10.2 10^3/uL 5.9   RBC (Red Blood Cell Count) 4.69 - 6.13 10^6/uL 4.50 Low    Hemoglobin 14.1 - 18.1  gm/dL 13.9 Low    Hematocrit 40.0 - 52.0 % 41.8   MCV (Mean Corpuscular Volume) 80.0 - 100.0 fl 92.9   MCH (Mean Corpuscular Hemoglobin) 27.0 - 31.2 pg 30.9   MCHC (Mean Corpuscular Hemoglobin Concentration) 32.0 - 36.0 gm/dL 33.3   Platelet Count 150 - 450 10^3/uL 187   RDW-CV (Red Cell Distribution Width) 11.6 - 14.8 % 13.0   MPV (Mean Platelet Volume) 9.4 - 12.4 fl 10.5   Neutrophils 1.50 - 7.80 10^3/uL 2.79   Lymphocytes 1.00 - 3.60 10^3/uL 1.91   Monocytes 0.00 - 1.50 10^3/uL 0.84   Eosinophils 0.00 - 0.55 10^3/uL 0.21   Basophils 0.00 - 0.09 10^3/uL 0.10 High    Neutrophil % 32.0 - 70.0 % 47.6   Lymphocyte % 10.0 - 50.0 % 32.5   Monocyte % 4.0 - 13.0 % 14.3 High    Eosinophil % 1.0 - 5.0 % 3.6   Basophil% 0.0 - 2.0 % 1.7   Immature Granulocyte % <=0.7 % 0.3   Immature Granulocyte Count <=0.06 10^3/L 0.02   Resulting Agency  [facility]   Specimen Collected: 04/25/21 10:06 Last Resulted: 04/25/21 15:36  Received From: [facility]  Result Received: 08/22/21 10:29   Glucose 70 - 110 mg/dL 112 High    Sodium 136 - 145 mmol/L 141   Potassium 3.6 - 5.1 mmol/L 4.5   Chloride 97 - 109 mmol/L 102   Carbon Dioxide (CO2) 22.0 - 32.0 mmol/L 29.0   Urea Nitrogen (BUN) 7 - 25 mg/dL 25   Creatinine 0.7 - 1.3 mg/dL 1.0   Glomerular Filtration Rate (eGFR), MDRD Estimate >60 mL/min/1.73sq m 72   Calcium 8.7 - 10.3 mg/dL 9.7   AST  8 - 39 U/L 19   ALT  6 - 57 U/L 25   Alk Phos (alkaline Phosphatase) 34 - 104 U/L 105 High    Albumin 3.5 - 4.8 g/dL 4.3   Bilirubin, Total 0.3 - 1.2 mg/dL 0.4   Protein, Total 6.1 - 7.9 g/dL 6.8   A/G  Ratio 1.0 - 5.0 gm/dL 1.7   Resulting Agency  [facility]   Specimen Collected: 04/25/21 10:02 Last Resulted: 04/25/21 17:11  Received From: [facility]  Result Received: 08/22/21 10:29   Hemoglobin A1C 4.2 - 5.6 % 6.5 High    Average Blood Glucose (Calc) mg/dL 140   Resulting [facility]  Narrative Performed by [facility] Normal Range:    4.2 - 5.6%  Increased Risk:  5.7 - 6.4%  Diabetes:        >= 6.5%  Glycemic Control for adults with diabetes:  <7%    Specimen Collected: 04/25/21 10:02 Last Resulted: 04/25/21 16:24  Received From: [facility]  Result Received: 08/22/21 10:29   Cholesterol, Total 100 - 200 mg/dL 159   Triglyceride 35 - 199 mg/dL 134   HDL (High Density Lipoprotein) Cholesterol 29.0 - 71.0 mg/dL 43.0   LDL Calculated 0 - 130 mg/dL 89   VLDL Cholesterol mg/dL 27   Cholesterol/HDL Ratio  3.7   Resulting Agency  [facility]   Specimen Collected: 04/25/21 10:02 Last Resulted: 04/25/21 17:13  Received From: [facility]  Result Received: 08/22/21 10:29   Thyroid Stimulating Hormone (TSH) 0.450-5.330 uIU/ml uIU/mL 3.410   Resulting Agency  [facility]   Specimen Collected: 04/25/21 10:02 Last  Resulted: 04/25/21 16:40  Received From: [facility]  Result Received: 08/22/21 10:29  I have reviewed the labs.   Pertinent Imaging: N/A  I have independently reviewed the films.  See HPI.    Assessment & Plan:    1. BPH with LUTS -aged out of prostate cancer screening -continue conservative management, avoiding bladder irritants and timed voiding's -Continue finasteride 5 mg daily -RTC in one year for I PSS and exam  [place] [address], [place], [place] [address] [phone]  Zara Council, PA-C

## 2021-08-29 ENCOUNTER — Ambulatory Visit: Payer: PPO | Admitting: Urology

## 2021-08-29 ENCOUNTER — Encounter: Payer: Self-pay | Admitting: Urology

## 2021-08-29 VITALS — BP 175/84 | HR 68 | Ht 70.0 in | Wt 267.0 lb

## 2021-08-29 DIAGNOSIS — N138 Other obstructive and reflux uropathy: Secondary | ICD-10-CM | POA: Diagnosis not present

## 2021-08-29 DIAGNOSIS — N401 Enlarged prostate with lower urinary tract symptoms: Secondary | ICD-10-CM | POA: Diagnosis not present

## 2022-05-19 DIAGNOSIS — H2513 Age-related nuclear cataract, bilateral: Secondary | ICD-10-CM | POA: Diagnosis not present

## 2022-05-19 DIAGNOSIS — H40003 Preglaucoma, unspecified, bilateral: Secondary | ICD-10-CM | POA: Diagnosis not present

## 2022-05-20 DIAGNOSIS — R202 Paresthesia of skin: Secondary | ICD-10-CM | POA: Diagnosis not present

## 2022-05-20 DIAGNOSIS — E559 Vitamin D deficiency, unspecified: Secondary | ICD-10-CM | POA: Diagnosis not present

## 2022-05-20 DIAGNOSIS — R351 Nocturia: Secondary | ICD-10-CM | POA: Diagnosis not present

## 2022-05-20 DIAGNOSIS — E782 Mixed hyperlipidemia: Secondary | ICD-10-CM | POA: Diagnosis not present

## 2022-05-20 DIAGNOSIS — I10 Essential (primary) hypertension: Secondary | ICD-10-CM | POA: Diagnosis not present

## 2022-05-20 DIAGNOSIS — E119 Type 2 diabetes mellitus without complications: Secondary | ICD-10-CM | POA: Diagnosis not present

## 2022-05-20 DIAGNOSIS — R200 Anesthesia of skin: Secondary | ICD-10-CM | POA: Diagnosis not present

## 2022-05-20 DIAGNOSIS — N401 Enlarged prostate with lower urinary tract symptoms: Secondary | ICD-10-CM | POA: Diagnosis not present

## 2022-05-20 DIAGNOSIS — Z0000 Encounter for general adult medical examination without abnormal findings: Secondary | ICD-10-CM | POA: Diagnosis not present

## 2022-05-20 DIAGNOSIS — C8339 Diffuse large B-cell lymphoma, extranodal and solid organ sites: Secondary | ICD-10-CM | POA: Diagnosis not present

## 2022-05-20 DIAGNOSIS — I700 Atherosclerosis of aorta: Secondary | ICD-10-CM | POA: Diagnosis not present

## 2022-05-21 DIAGNOSIS — R200 Anesthesia of skin: Secondary | ICD-10-CM | POA: Diagnosis not present

## 2022-05-21 DIAGNOSIS — R202 Paresthesia of skin: Secondary | ICD-10-CM | POA: Diagnosis not present

## 2022-05-21 DIAGNOSIS — E119 Type 2 diabetes mellitus without complications: Secondary | ICD-10-CM | POA: Diagnosis not present

## 2022-05-21 DIAGNOSIS — E559 Vitamin D deficiency, unspecified: Secondary | ICD-10-CM | POA: Diagnosis not present

## 2022-05-21 DIAGNOSIS — I10 Essential (primary) hypertension: Secondary | ICD-10-CM | POA: Diagnosis not present

## 2022-05-21 DIAGNOSIS — E782 Mixed hyperlipidemia: Secondary | ICD-10-CM | POA: Diagnosis not present

## 2022-08-23 ENCOUNTER — Other Ambulatory Visit: Payer: Self-pay | Admitting: Urology

## 2022-08-23 DIAGNOSIS — N401 Enlarged prostate with lower urinary tract symptoms: Secondary | ICD-10-CM

## 2022-08-27 NOTE — Progress Notes (Signed)
08/22/19 9:15 AM   Zachary Robertson Aug 09, 1940 [id number]  Referring provider: [facility] [address] [place],  [place] [address]  Urological history: 1. BPH with LU TS -PSA 0.5 in 2018 -prostate volume 34 cc on 11/2019 CT -managed with finasteride 5 mg daily   2. Family history of prostate cancer -father with prostate cancer  3. Bilateral renal cysts -largest cyst at 4 cm on left kidney on 11/2019 CT  Chief Complaint  Patient presents with   Benign Prostatic Hypertrophy     HPI: Zachary Robertson is a 82 y.o. who presents today for a one year follow up.    I PSS 8/2  No complaints.  Patient denies any modifying or aggravating factors.  Patient denies any gross hematuria, dysuria or suprapubic/flank pain.  Patient denies any fevers, chills, nausea or vomiting.     IPSS     Row Name 08/31/22 0900         International Prostate Symptom Score   How often have you had the sensation of not emptying your bladder? Less than 1 in 5     How often have you had to urinate less than every two hours? Less than 1 in 5 times     How often have you found you stopped and started again several times when you urinated? Less than 1 in 5 times     How often have you found it difficult to postpone urination? Less than 1 in 5 times     How often have you had a weak urinary stream? Less than half the time     How often have you had to strain to start urination? Less than 1 in 5 times     How many times did you typically get up at night to urinate? 1 Time     Total IPSS Score 8       Quality of Life due to urinary symptoms   If you were to spend the rest of your life with your urinary condition just the way it is now how would you feel about that? Mostly Satisfied                Score:  1-7 Mild 8-19 Moderate 20-35 Severe  PMH: Past Medical History:  Diagnosis Date   Acid reflux 06/03/2015   Adenomatous colon polyp 08/12/2014   Overview:  On 05/25/2011 colonoscopy;  repeat 05/2016 (Dr. Marva Panda)    BPH (benign prostatic hyperplasia)    Cancer ([facility])    LYMPHOMA   Dribbling    ED (erectile dysfunction)    Essential (primary) hypertension 06/03/2015   Fatigue    GERD (gastroesophageal reflux disease)    Glaucoma 08/07/2013   Hesitancy    HTN (hypertension)    Hypertension    Hypogonadism in male    Nocturia    Obese    Urgency-frequency syndrome     Surgical History: Past Surgical History:  Procedure Laterality Date   BOWEL RESECTION  09/18/2015   Procedure: SMALL BOWEL RESECTION;  Surgeon: Ricarda Frame, MD;  Location: [facility] ORS;  Service: General;;  with anastomosis   BOWEL RESECTION     COLONOSCOPY WITH PROPOFOL N/A 01/04/2017   Procedure: COLONOSCOPY WITH PROPOFOL;  Surgeon: Christena Deem, MD;  Location: [facility] ENDOSCOPY;  Service: Endoscopy;  Laterality: N/A;   HERNIA REPAIR     LAPAROSCOPY  09/18/2015   Procedure: LAPAROSCOPY DIAGNOSTIC;  Surgeon: Ricarda Frame, MD;  Location: [facility] ORS;  Service: General;;  LAPAROTOMY N/A 09/18/2015   Procedure: EXPLORATORY LAPAROTOMY;  Surgeon: Ricarda Frame, MD;  Location: [facility] ORS;  Service: General;  Laterality: N/A;    Home Medications:  Allergies as of 08/31/2022   No Known Allergies      Medication List        Accurate as of August 31, 2022  9:15 AM. If you have any questions, ask your nurse or doctor.          aspirin EC 81 MG tablet Take 81 mg by mouth daily.   CHOLECALCIFEROL PO Take by mouth.   cyanocobalamin 1000 MCG tablet Take 2 tablets daily for 2 weeks, then reduce to 1 tablet daily thereafter for Vitamin B12 Deficiency.   finasteride 5 MG tablet Commonly known as: PROSCAR TAKE 1 TABLET BY MOUTH DAILY   Fish Oil 1200 MG Caps Take 1,200-2,400 mg by mouth See admin instructions. Take 2 capsules (2400mg ) by mouth every morning and 1 capsule by mouth in the evening.   ibuprofen 200 MG tablet Commonly known as: ADVIL Take 200 mg by mouth every 6 (six) hours as  needed.   latanoprost 0.005 % ophthalmic solution Commonly known as: XALATAN Place 1 drop into both eyes every morning.   lisinopril-hydrochlorothiazide 10-12.5 MG tablet Commonly known as: ZESTORETIC Take 1 tablet by mouth daily.   pravastatin 40 MG tablet Commonly known as: PRAVACHOL Take 40 mg by mouth daily. What changed: Another medication with the same name was removed. Continue taking this medication, and follow the directions you see here. Changed by: Michiel Cowboy, PA-C   timolol 0.5 % ophthalmic solution Commonly known as: TIMOPTIC Apply 1 drop to eye every morning.        Allergies: No Known Allergies  Family History: Family History  Problem Relation Age of Onset   Prostate cancer Father    Cancer Father    Cancer Other        family members in general   Cancer Sister    Clotting disorder Sister    Cancer Brother    Bladder Cancer Neg Hx    Kidney cancer Neg Hx     Social History:  reports that he has been smoking cigars. He has never used smokeless tobacco. He reports current alcohol use. He reports that he does not use drugs.   Physical Exam: BP (!) 180/90 (BP Location: Left Arm, Patient Position: Sitting, Cuff Size: Large) Comment: no BP meds yet  Pulse 60   Ht 5\' 10"  (1.778 m)   Wt 250 lb (113.4 kg)   BMI 35.87 kg/m   Constitutional:  Well nourished. Alert and oriented, No acute distress. HEENT: [place] AT, moist mucus membranes.  Trachea midline Cardiovascular: No clubbing, cyanosis, or edema. Respiratory: Normal respiratory effort, no increased work of breathing. Neurologic: Grossly intact, no focal deficits, moving all 4 extremities. Psychiatric: Normal mood and affect.   Laboratory Data: Serum creatinine (04/2022) 1.1, eGFR 67 Total cholesterol (04/2022) 169 Hemoglobin A1c (04/2022) 6.5  I have reviewed the labs.   Pertinent Imaging: N/A  I have independently reviewed the films.  See HPI.    Assessment & Plan:    1. BPH with  LUTS -aged out of prostate cancer screening -continue conservative management, avoiding bladder irritants and timed voiding's -Continue finasteride 5 mg daily -RTC in one year for I PSS and exam  Cloretta Ned   [facility] [address], [address] [place], [place] [address] [phone]

## 2022-08-28 ENCOUNTER — Ambulatory Visit: Payer: PPO | Admitting: Urology

## 2022-08-31 ENCOUNTER — Ambulatory Visit (INDEPENDENT_AMBULATORY_CARE_PROVIDER_SITE_OTHER): Payer: PPO | Admitting: Urology

## 2022-08-31 ENCOUNTER — Encounter: Payer: Self-pay | Admitting: Urology

## 2022-08-31 VITALS — BP 180/90 | HR 60 | Ht 70.0 in | Wt 250.0 lb

## 2022-08-31 DIAGNOSIS — N138 Other obstructive and reflux uropathy: Secondary | ICD-10-CM

## 2022-08-31 DIAGNOSIS — N401 Enlarged prostate with lower urinary tract symptoms: Secondary | ICD-10-CM

## 2022-08-31 MED ORDER — FINASTERIDE 5 MG PO TABS: 5.0000 mg | ORAL_TABLET | Freq: Every day | ORAL | 3 refills | Status: DC

## 2022-09-15 IMAGING — CT CT ABD-PELV W/ CM
1 of 3 series · 14 of 32 positions shown, 19 images · IV contrast (APPLIED)
Comparison: 12/12/2018

CLINICAL DATA: Follow-up diffuse large B-cell lymphoma. Previous
chemotherapy.

EXAM:
CT ABDOMEN AND PELVIS WITH CONTRAST
TECHNIQUE: Multidetector CT imaging of the abdomen and pelvis was performed
using the standard protocol following bolus administration of
intravenous contrast.
CONTRAST:  125mL OMNIPAQUE IOHEXOL 300 MG/ML  SOLN

[Series 2: axial st · axial · 0.85mm/px · z∈[-1019,-594]mm · 14 of 97 slices shown, 19 images]
[im 6/97  soft-tissue]
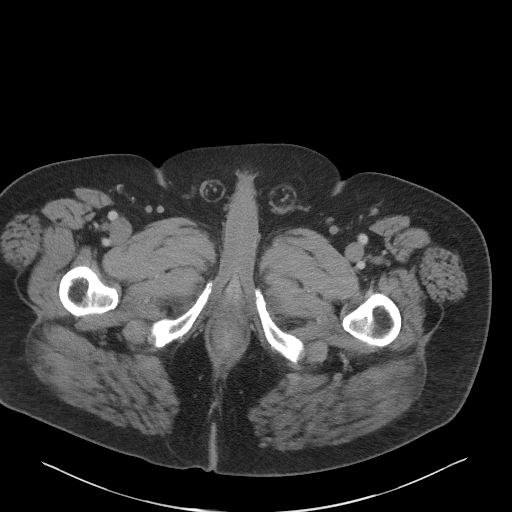
[im 6/97  bone]
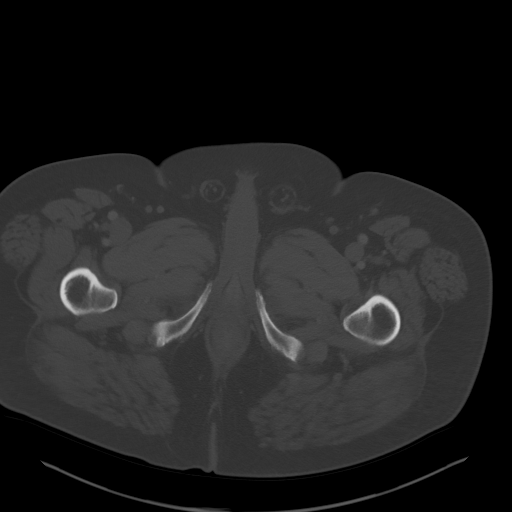
[im 16/97  soft-tissue]
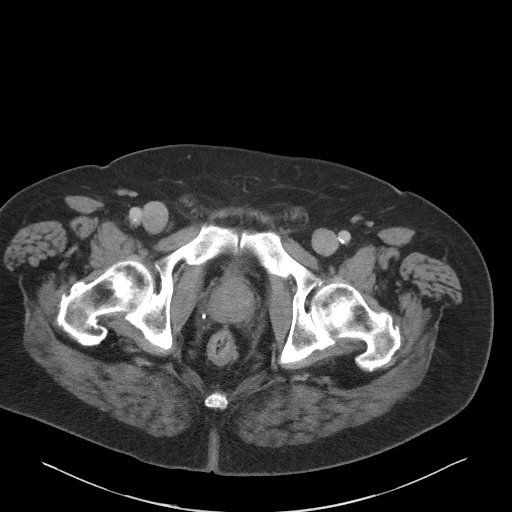
[im 21/97  soft-tissue]
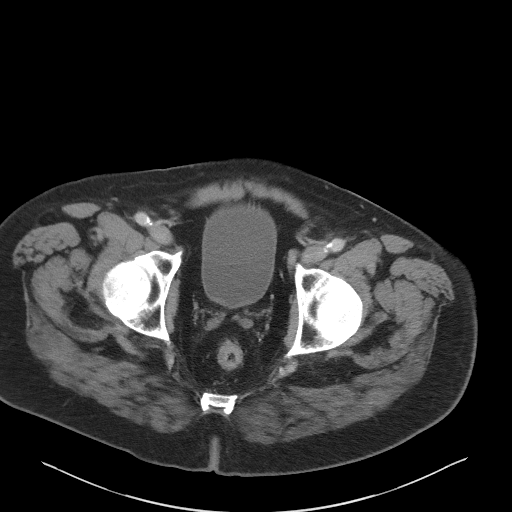
[im 26/97  soft-tissue]
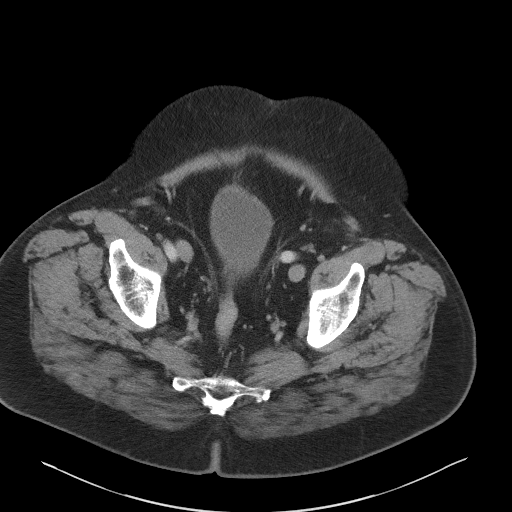
[im 36/97  soft-tissue]
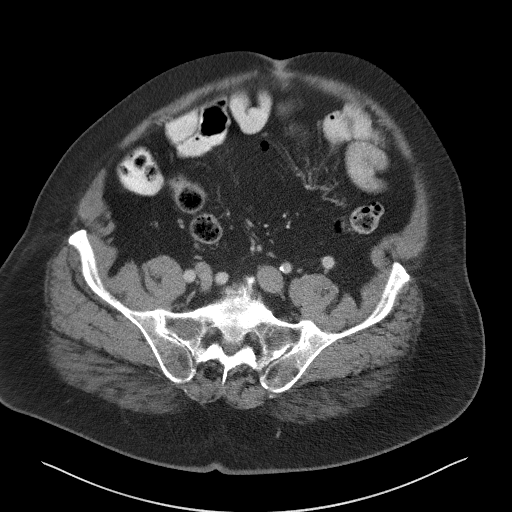
[im 41/97  soft-tissue]
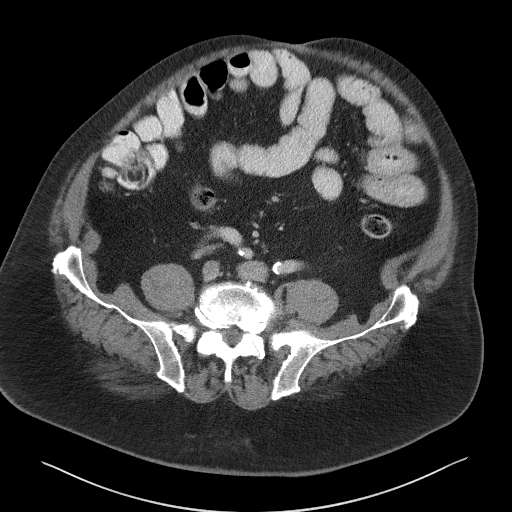
[im 51/97  soft-tissue]
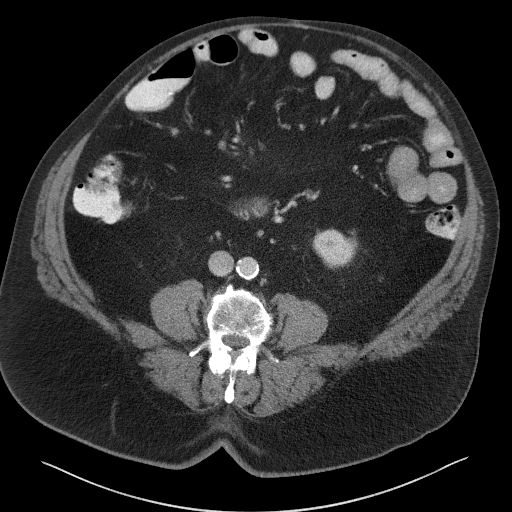
[im 56/97  soft-tissue]
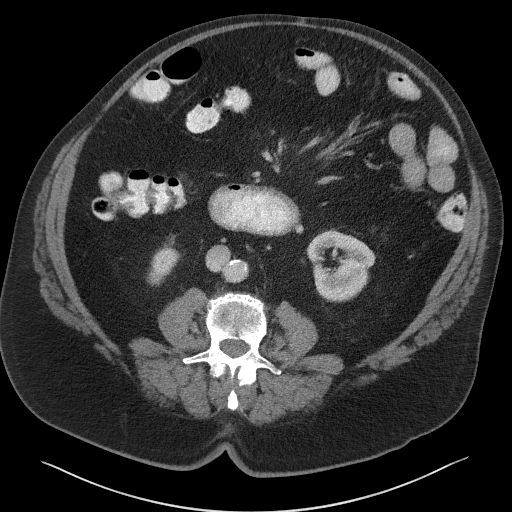
[im 61/97  soft-tissue]
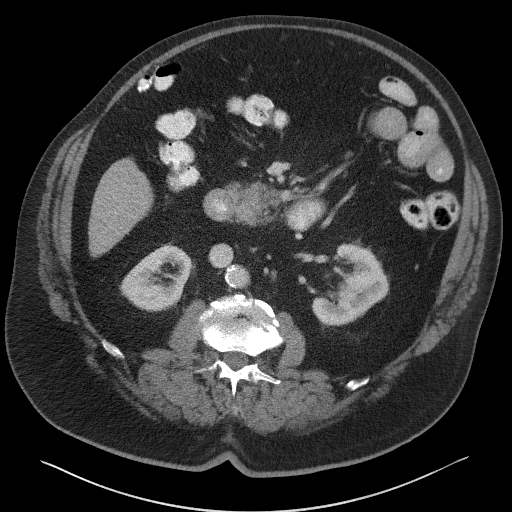
[im 61/97  bone]
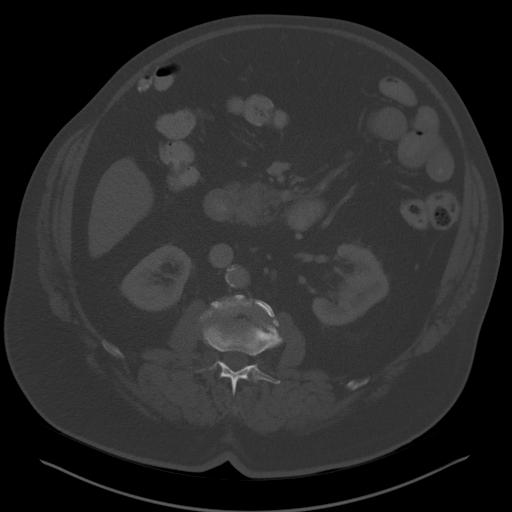
[im 71/97  soft-tissue]
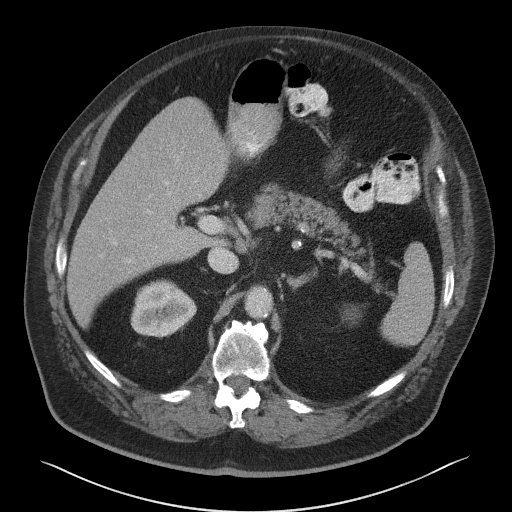
[im 76/97  soft-tissue]
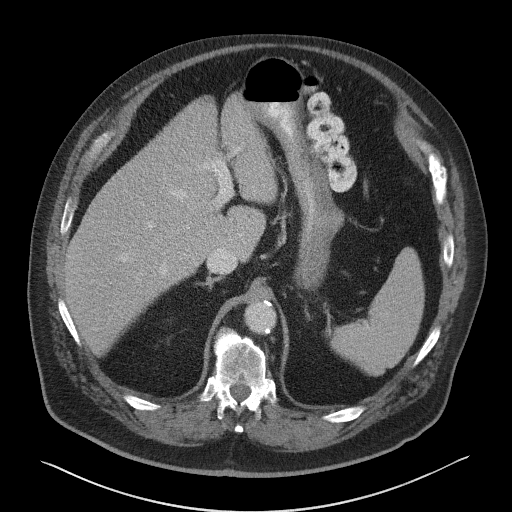
[im 76/97  lung]
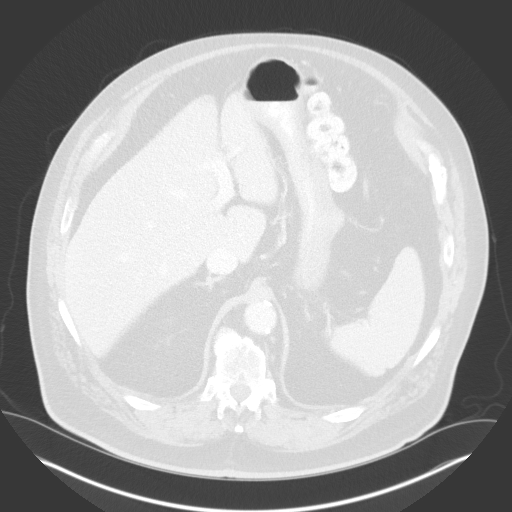
[im 81/97  soft-tissue]
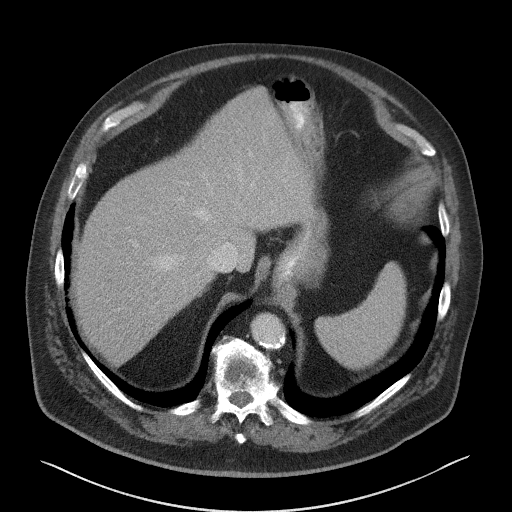
[im 81/97  lung]
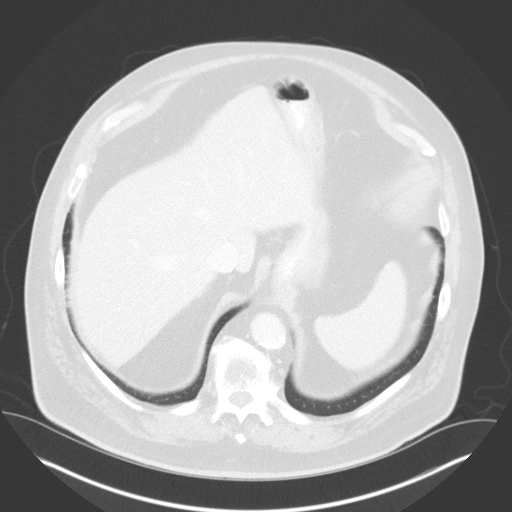
[im 86/97  lung]
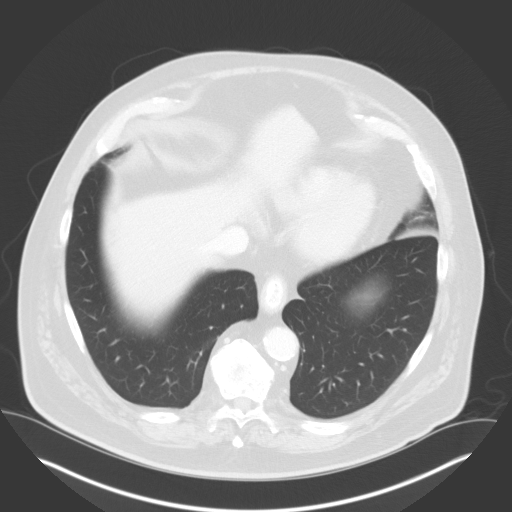
[im 91/97  soft-tissue]
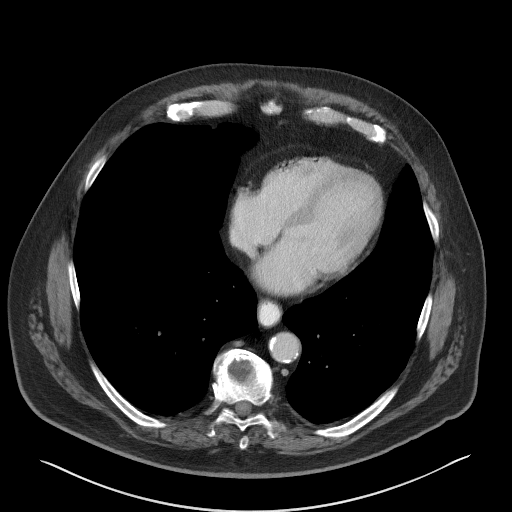
[im 91/97  lung]
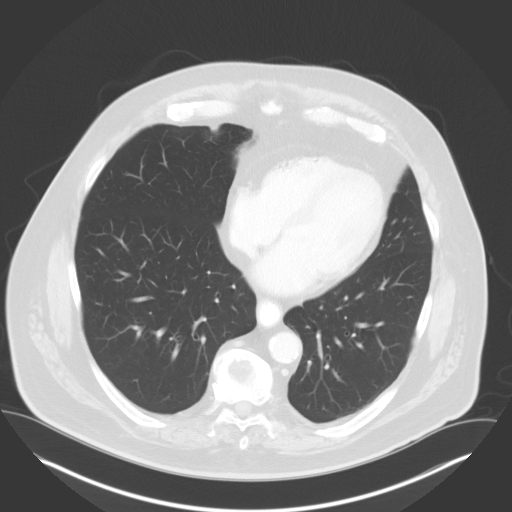

[14 of 32 positions shown; findings below may reference images not displayed]

FINDINGS: Lower Chest: No acute findings.

Hepatobiliary: No hepatic masses identified. Gallbladder is
unremarkable. No evidence of biliary ductal dilatation.

Pancreas:  No mass or inflammatory changes.

Spleen: Within normal limits in size and appearance.

Adrenals/Urinary Tract: No masses identified. A few small bilateral
renal cysts remains stable. No evidence of ureteral calculi or
hydronephrosis.

Stomach/Bowel: Mild wall thickening involving the duodenum and
jejunum remains stable, and no focal mass is identified. No evidence
of bowel obstruction or inflammatory process. Diverticulosis is seen
mainly involving the sigmoid colon, however there is no evidence of
diverticulitis.

Vascular/Lymphatic: No pathologically enlarged lymph nodes. No
abdominal aortic aneurysm. Aortic atherosclerosis noted.

Reproductive:  No mass or other significant abnormality.

Other:  None.

Musculoskeletal:  No suspicious bone lesions identified.
IMPRESSION: No evidence of recurrent lymphoma.

Stable mild wall thickening involving the duodenum and jejunum,
which is of doubtful clinical significance.

Colonic diverticulosis. No radiographic evidence of diverticulitis.

Aortic Atherosclerosis ([id number].F).

## 2022-11-17 DIAGNOSIS — H40003 Preglaucoma, unspecified, bilateral: Secondary | ICD-10-CM | POA: Diagnosis not present

## 2022-11-17 DIAGNOSIS — H43813 Vitreous degeneration, bilateral: Secondary | ICD-10-CM | POA: Diagnosis not present

## 2022-11-17 DIAGNOSIS — H2513 Age-related nuclear cataract, bilateral: Secondary | ICD-10-CM | POA: Diagnosis not present

## 2022-12-02 DIAGNOSIS — E559 Vitamin D deficiency, unspecified: Secondary | ICD-10-CM | POA: Diagnosis not present

## 2022-12-02 DIAGNOSIS — I700 Atherosclerosis of aorta: Secondary | ICD-10-CM | POA: Diagnosis not present

## 2022-12-02 DIAGNOSIS — N401 Enlarged prostate with lower urinary tract symptoms: Secondary | ICD-10-CM | POA: Diagnosis not present

## 2022-12-02 DIAGNOSIS — I10 Essential (primary) hypertension: Secondary | ICD-10-CM | POA: Diagnosis not present

## 2022-12-02 DIAGNOSIS — E119 Type 2 diabetes mellitus without complications: Secondary | ICD-10-CM | POA: Diagnosis not present

## 2022-12-02 DIAGNOSIS — E782 Mixed hyperlipidemia: Secondary | ICD-10-CM | POA: Diagnosis not present

## 2022-12-02 DIAGNOSIS — R351 Nocturia: Secondary | ICD-10-CM | POA: Diagnosis not present

## 2022-12-02 DIAGNOSIS — C8339 Diffuse large B-cell lymphoma, extranodal and solid organ sites: Secondary | ICD-10-CM | POA: Diagnosis not present

## 2023-02-02 DIAGNOSIS — H2512 Age-related nuclear cataract, left eye: Secondary | ICD-10-CM | POA: Diagnosis not present

## 2023-02-03 ENCOUNTER — Encounter: Payer: Self-pay | Admitting: Ophthalmology

## 2023-02-09 NOTE — Anesthesia Preprocedure Evaluation (Signed)
Anesthesia Evaluation  Patient identified by MRN, date of birth, ID band Patient awake    Reviewed: Allergy & Precautions, H&P , NPO status , Patient's Chart, lab work & pertinent test results  Airway Mallampati: III  TM Distance: >3 FB Neck ROM: Full   Comment: Snores heavily, thick neck, likely sleep apnea, discussed risks untreated sleep apnea, encouraged sleep study Dental no notable dental hx.    Pulmonary neg pulmonary ROS, Current Smoker and Patient abstained from smoking.   Pulmonary exam normal breath sounds clear to auscultation       Cardiovascular hypertension, negative cardio ROS Normal cardiovascular exam Rhythm:Regular Rate:Normal     Neuro/Psych negative neurological ROS  negative psych ROS   GI/Hepatic negative GI ROS, Neg liver ROS,,,  Endo/Other  negative endocrine ROS    Renal/GU negative Renal ROS  negative genitourinary   Musculoskeletal negative musculoskeletal ROS (+)    Abdominal   Peds negative pediatric ROS (+)  Hematology negative hematology ROS (+)   Anesthesia Other Findings Medical History   GERD (gastroesophageal reflux disease) Nocturia Urgency-frequency syndrome  HTN (hypertension) BPH (benign prostatic hyperplasia) Obese ED (erectile dysfunction)  Hypogonadism in male Essential (primary) hypertension  Acid reflux Adenomatous colon polyp  Glaucoma Hypertension  Fatigue Cancer ([facility])     Reproductive/Obstetrics negative OB ROS                              Anesthesia Physical Anesthesia Plan  ASA: 2  Anesthesia Plan: MAC   Post-op Pain Management:    Induction: Intravenous  PONV Risk Score and Plan:   Airway Management Planned: Natural Airway and Nasal Cannula  Additional Equipment:   Intra-op Plan:   Post-operative Plan:   Informed Consent: I have reviewed the patients History and Physical, chart, labs and discussed the  procedure including the risks, benefits and alternatives for the proposed anesthesia with the patient or authorized representative who has indicated his/her understanding and acceptance.     Dental Advisory Given  Plan Discussed with: Anesthesiologist, CRNA and Surgeon  Anesthesia Plan Comments: (Patient consented for risks of anesthesia including but not limited to:  - adverse reactions to medications - damage to eyes, teeth, lips or other oral mucosa - nerve damage due to positioning  - sore throat or hoarseness - Damage to heart, brain, nerves, lungs, other parts of body or loss of life  Patient voiced understanding and assent.)         Anesthesia Quick Evaluation

## 2023-02-09 NOTE — Discharge Instructions (Signed)

## 2023-02-10 ENCOUNTER — Other Ambulatory Visit: Payer: Self-pay

## 2023-02-10 ENCOUNTER — Ambulatory Visit: Payer: Self-pay | Admitting: Anesthesiology

## 2023-02-10 ENCOUNTER — Ambulatory Visit
Admission: RE | Admit: 2023-02-10 | Discharge: 2023-02-10 | Disposition: A | Discharge status: Home / Self Care | Payer: HMO | Attending: Ophthalmology | Admitting: Ophthalmology

## 2023-02-10 ENCOUNTER — Encounter: Payer: Self-pay | Admitting: Ophthalmology

## 2023-02-10 ENCOUNTER — Encounter
Admission: RE | Disposition: A | Discharge status: Home / Self Care | Payer: Self-pay | Source: Home / Self Care | Attending: Ophthalmology

## 2023-02-10 DIAGNOSIS — F17290 Nicotine dependence, other tobacco product, uncomplicated: Secondary | ICD-10-CM | POA: Diagnosis not present

## 2023-02-10 DIAGNOSIS — I10 Essential (primary) hypertension: Secondary | ICD-10-CM | POA: Diagnosis not present

## 2023-02-10 DIAGNOSIS — N400 Enlarged prostate without lower urinary tract symptoms: Secondary | ICD-10-CM | POA: Diagnosis not present

## 2023-02-10 DIAGNOSIS — H2512 Age-related nuclear cataract, left eye: Secondary | ICD-10-CM | POA: Insufficient documentation

## 2023-02-10 DIAGNOSIS — E669 Obesity, unspecified: Secondary | ICD-10-CM | POA: Insufficient documentation

## 2023-02-10 DIAGNOSIS — H5703 Miosis: Secondary | ICD-10-CM | POA: Insufficient documentation

## 2023-02-10 DIAGNOSIS — H409 Unspecified glaucoma: Secondary | ICD-10-CM | POA: Diagnosis not present

## 2023-02-10 DIAGNOSIS — Z6837 Body mass index (BMI) 37.0-37.9, adult: Secondary | ICD-10-CM | POA: Diagnosis not present

## 2023-02-10 DIAGNOSIS — F17200 Nicotine dependence, unspecified, uncomplicated: Secondary | ICD-10-CM | POA: Diagnosis not present

## 2023-02-10 DIAGNOSIS — H269 Unspecified cataract: Secondary | ICD-10-CM | POA: Diagnosis not present

## 2023-02-10 HISTORY — PX: CATARACT EXTRACTION W/PHACO: SHX586

## 2023-02-10 SURGERY — PHACOEMULSIFICATION, CATARACT, WITH IOL INSERTION
Anesthesia: Monitor Anesthesia Care | Laterality: Left

## 2023-02-10 MED ORDER — MIDAZOLAM HCL 2 MG/2ML IJ SOLN
INTRAMUSCULAR | Status: DC | PRN
  Administered 2023-02-10: 2 mg via INTRAVENOUS

## 2023-02-10 MED ORDER — TETRACAINE HCL 0.5 % OP SOLN
1.0000 [drp] | OPHTHALMIC | Status: DC | PRN
Start: 2023-02-10 — End: 2023-02-10
  Administered 2023-02-10 (×3): 1 [drp] via OPHTHALMIC

## 2023-02-10 MED ORDER — ARMC OPHTHALMIC DILATING DROPS
OPHTHALMIC | Status: AC
  Filled 2023-02-10: qty 0.5

## 2023-02-10 MED ORDER — SIGHTPATH DOSE#1 NA HYALUR & NA CHOND-NA HYALUR IO KIT
PACK | INTRAOCULAR | Status: DC | PRN
  Administered 2023-02-10: 1 via OPHTHALMIC

## 2023-02-10 MED ORDER — FENTANYL CITRATE (PF) 100 MCG/2ML IJ SOLN
INTRAMUSCULAR | Status: DC | PRN
  Administered 2023-02-10 (×2): 50 ug via INTRAVENOUS

## 2023-02-10 MED ORDER — ARMC OPHTHALMIC DILATING DROPS
1.0000 | OPHTHALMIC | Status: DC | PRN
  Administered 2023-02-10 (×3): 1 via OPHTHALMIC

## 2023-02-10 MED ORDER — SIGHTPATH DOSE#1 BSS IO SOLN
INTRAOCULAR | Status: DC | PRN
  Administered 2023-02-10: 62 mL via OPHTHALMIC

## 2023-02-10 MED ORDER — SIGHTPATH DOSE#1 BSS IO SOLN
INTRAOCULAR | Status: DC | PRN
  Administered 2023-02-10: 2 mL

## 2023-02-10 MED ORDER — MIDAZOLAM HCL 2 MG/2ML IJ SOLN
INTRAMUSCULAR | Status: AC
  Filled 2023-02-10: qty 2

## 2023-02-10 MED ORDER — CEFUROXIME OPHTHALMIC INJECTION 1 MG/0.1 ML
INJECTION | OPHTHALMIC | Status: DC | PRN
  Administered 2023-02-10: 1 mg via INTRACAMERAL

## 2023-02-10 MED ORDER — BRIMONIDINE TARTRATE-TIMOLOL 0.2-0.5 % OP SOLN
OPHTHALMIC | Status: DC | PRN
  Administered 2023-02-10: 1 [drp] via OPHTHALMIC

## 2023-02-10 MED ORDER — TETRACAINE HCL 0.5 % OP SOLN
OPHTHALMIC | Status: AC
  Filled 2023-02-10: qty 4

## 2023-02-10 MED ORDER — FENTANYL CITRATE (PF) 100 MCG/2ML IJ SOLN
INTRAMUSCULAR | Status: AC
  Filled 2023-02-10: qty 2

## 2023-02-10 MED ORDER — SIGHTPATH DOSE#1 BSS IO SOLN
INTRAOCULAR | Status: DC | PRN
  Administered 2023-02-10: 15 mL via INTRAOCULAR

## 2023-02-10 SURGICAL SUPPLY — 9 items
CATARACT SUITE SIGHTPATH (MISCELLANEOUS) ×1
FEE CATARACT SUITE SIGHTPATH (MISCELLANEOUS) ×1 IMPLANT
GLOVE SRG 8 PF TXTR STRL LF DI (GLOVE) ×1 IMPLANT
GLOVE SURG ENC TEXT LTX SZ7.5 (GLOVE) ×1 IMPLANT
LENS CLRN VIVITY TORIC 4 22.0 ×1 IMPLANT
LENS IOL CLRN VT TRC 4 22.0 IMPLANT
NDL FILTER BLUNT 18X1 1/2 (NEEDLE) ×1 IMPLANT
NEEDLE FILTER BLUNT 18X1 1/2 (NEEDLE) ×1
SYR 3ML LL SCALE MARK (SYRINGE) ×1 IMPLANT

## 2023-02-10 NOTE — Anesthesia Postprocedure Evaluation (Signed)
Anesthesia Post Note  Patient: Zachary Robertson  Procedure(s) Performed: CATARACT EXTRACTION PHACO AND INTRAOCULAR LENS PLACEMENT (IOC) LEFT MALYUGIN  clareon vivity toric 9.10 01:01.9 (Left)  Patient location during evaluation: PACU Anesthesia Type: MAC Level of consciousness: awake and alert Pain management: pain level controlled Vital Signs Assessment: post-procedure vital signs reviewed and stable Respiratory status: spontaneous breathing, nonlabored ventilation, respiratory function stable and patient connected to nasal cannula oxygen Cardiovascular status: stable and blood pressure returned to baseline Postop Assessment: no apparent nausea or vomiting Anesthetic complications: no   No notable events documented.   Last Vitals:  Vitals:   02/10/23 1111 02/10/23 1118  BP: 121/62 123/64  Pulse:  (!) 56  Resp: 20 20  Temp: 36.7 C   SpO2: 99% 97%    Last Pain:  Vitals:   02/10/23 1000  TempSrc: Temporal  PainSc: 0-No pain                 Coren Sagan C Darinda [place]

## 2023-02-10 NOTE — H&P (Signed)
[facility]   Primary Care Physician:  [facility] Ophthalmologist: Dr. Lockie Mola  Pre-Procedure History & Physical: HPI:  Zachary Robertson is a 82 y.o. male here for ophthalmic surgery.   Past Medical History:  Diagnosis Date   Acid reflux 06/03/2015   Adenomatous colon polyp 08/12/2014   Overview:  On 05/25/2011 colonoscopy; repeat 05/2016 (Dr. Marva Panda)    BPH (benign prostatic hyperplasia)    Cancer (HCC)    LYMPHOMA   Dribbling    ED (erectile dysfunction)    Essential (primary) hypertension 06/03/2015   Fatigue    GERD (gastroesophageal reflux disease)    Glaucoma 08/07/2013   Hesitancy    HTN (hypertension)    Hypertension    Hypogonadism in male    Nocturia    Obese    Urgency-frequency syndrome     Past Surgical History:  Procedure Laterality Date   BOWEL RESECTION  09/18/2015   Procedure: SMALL BOWEL RESECTION;  Surgeon: Ricarda Frame, MD;  Location: [facility] ORS;  Service: General;;  with anastomosis   BOWEL RESECTION     COLONOSCOPY WITH PROPOFOL N/A 01/04/2017   Procedure: COLONOSCOPY WITH PROPOFOL;  Surgeon: Christena Deem, MD;  Location: [facility] ENDOSCOPY;  Service: Endoscopy;  Laterality: N/A;   HERNIA REPAIR     LAPAROSCOPY  09/18/2015   Procedure: LAPAROSCOPY DIAGNOSTIC;  Surgeon: Ricarda Frame, MD;  Location: [facility] ORS;  Service: General;;   LAPAROTOMY N/A 09/18/2015   Procedure: EXPLORATORY LAPAROTOMY;  Surgeon: Ricarda Frame, MD;  Location: [facility] ORS;  Service: General;  Laterality: N/A;    Prior to Admission medications   Medication Sig Start Date End Date Taking? Authorizing Provider  aspirin EC 81 MG tablet Take 81 mg by mouth daily.   Yes [provider]  CHOLECALCIFEROL PO Take by mouth.   Yes [provider]  cyanocobalamin 1000 MCG tablet Take 2 tablets daily for 2 weeks, then reduce to 1 tablet daily thereafter for Vitamin B12 Deficiency. 04/25/21  Yes [provider]  finasteride (PROSCAR) 5 MG  tablet Take 1 tablet (5 mg total) by mouth daily. 08/31/22  Yes McGowan, Carollee Herter A, PA-C  ibuprofen (ADVIL,MOTRIN) 200 MG tablet Take 200 mg by mouth every 6 (six) hours as needed.   Yes [provider]  latanoprost (XALATAN) 0.005 % ophthalmic solution Place 1 drop into both eyes every morning.  04/24/15  Yes [provider]  Omega-3 Fatty Acids (FISH OIL) 1200 MG CAPS Take 1,200-2,400 mg by mouth See admin instructions. Take 2 capsules (2400mg ) by mouth every morning and 1 capsule by mouth in the evening.   Yes [provider]  pravastatin (PRAVACHOL) 40 MG tablet Take 40 mg by mouth daily. 08/11/19  Yes [provider]  timolol (TIMOPTIC) 0.5 % ophthalmic solution Apply 1 drop to eye every morning.  05/12/13  Yes [provider]  lisinopril-hydrochlorothiazide (PRINZIDE,ZESTORETIC) 10-12.5 MG tablet Take 1 tablet by mouth daily. 09/23/15 08/29/20  Alford Highland, MD    Allergies as of 11/18/2022   (No Known Allergies)    Family History  Problem Relation Age of Onset   Prostate cancer Father    Cancer Father    Cancer Other        family members in general   Cancer Sister    Clotting disorder Sister    Cancer Brother    Bladder Cancer Neg Hx    Kidney cancer Neg Hx     Social History   Socioeconomic History  Marital status: Single    Spouse name: Not on file   Number of children: Not on file   Years of education: Not on file   Highest education level: Not on file  Occupational History   Not on file  Tobacco Use   Smoking status: Some Days    Types: Cigars    Last attempt to quit: 04/10/2015    Years since quitting: 7.8   Smokeless tobacco: Never   Tobacco comments:    occasional cigar  Substance and Sexual Activity   Alcohol use: Yes    Alcohol/week: 0.0 standard drinks of alcohol    Comment: 1-2 drinks of Liquor Daily.   Drug use: No   Sexual activity: Not on file  Other Topics Concern   Not on file  Social History Narrative    Not on file   Social Determinants of Health   Financial Resource Strain: Not on file  Food Insecurity: Not on file  Transportation Needs: Not on file  Physical Activity: Not on file  Stress: Not on file  Social Connections: Not on file  Intimate Partner Violence: Not on file    Review of Systems: See HPI, otherwise negative ROS  Physical Exam: BP (!) 146/70   Temp 98.2 F (36.8 C) (Temporal)   Resp (!) 21   Ht 5\' 10"  (1.778 m)   Wt 119.4 kg   SpO2 96%   BMI 37.77 kg/m  General:   Alert,  pleasant and cooperative in NAD Head:  Normocephalic and atraumatic. Lungs:  Clear to auscultation.    Heart:  Regular rate and rhythm.   Impression/Plan: Zachary Robertson is here for ophthalmic surgery.  Risks, benefits, limitations, and alternatives regarding ophthalmic surgery have been reviewed with the patient.  Questions have been answered.  All parties agreeable.   Lockie Mola, MD  02/10/2023, 10:05 AM

## 2023-02-10 NOTE — Op Note (Signed)
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 Item Serial No. Manufacturer Lot No. LRB No. Used Action  LENS CLAREON VIVITY TORIC 22.0 - [id number]  LENS CLAREON VIVITY TORIC 22.0 [id number] [employer]  Left 1 Implanted     CNWET4 Vivity Toric intraocular lens with 2.25 diopters of cylindrical power with axis orientation at 178 degrees.     SURGEON:  Deirdre Evener, MD   ANESTHESIA:  Topical with tetracaine drops and 2% Xylocaine jelly, augmented with 1% preservative-free intracameral lidocaine.  COMPLICATIONS:  None.   DESCRIPTION OF PROCEDURE:  The patient was identified in the holding room and transported to the operating suite and placed in the supine position under the operating microscope.  The left eye was identified as the operative eye, and it was prepped and draped in the usual sterile ophthalmic fashion.    A clear-corneal paracentesis incision was made at the 1:30 position.  0.5 ml of preservative-free 1% lidocaine was injected into the anterior chamber. The anterior chamber was filled with Viscoat.  A 2.4 millimeter near clear corneal incision was then made at the 10:30 position. A Malyugin ring was placed to enlarge the pupil to 7mm.  A cystotome and capsulorrhexis forceps were then used to make a curvilinear capsulorrhexis.  Hydrodissection and hydrodelineation were then performed using balanced salt solution.   Phacoemulsification was then used in stop and chop fashion to remove the  lens, nucleus and epinucleus.  The remaining cortex was aspirated using the irrigation and aspiration handpiece.  Provisc viscoelastic was then placed into the capsular bag to distend it for lens placement.  The Verion digital marker was used to align the implant at the intended axis.   A Toric lens was then injected into the capsular bag.  It was rotated clockwise until the axis marks on the lens were approximately 15 degrees in the counterclockwise direction to the intended alignment. The Malyugin ring was removed. The viscoelastic was aspirated from the eye using the irrigation aspiration handpiece.  Then, a Koch spatula through the sideport incision was used to rotate the lens in a clockwise direction until the axis markings of the intraocular lens were lined up with the Verion alignment.  Balanced salt solution was then used to hydrate the wounds. Cefuroxime 0.1 ml of a 10mg /ml solution was injected into the anterior chamber for a dose of 1 mg of intracameral antibiotic at the completion of the case.    The eye was noted to have a physiologic pressure and there was no wound leak noted.   Timolol and Brimonidine drops were applied to the eye.  The patient was taken to the recovery room in stable condition having had no complications of anesthesia or surgery.  Talvin Christianson 02/10/2023, 11:10 AM

## 2023-02-10 NOTE — Transfer of Care (Signed)
Immediate Anesthesia Transfer of Care Note  Patient: Zachary Robertson  Procedure(s) Performed: CATARACT EXTRACTION PHACO AND INTRAOCULAR LENS PLACEMENT (IOC) LEFT MALYUGIN  clareon vivity toric 9.10 01:01.9 (Left)  Patient Location: PACU  Anesthesia Type: MAC  Level of Consciousness: awake, alert  and patient cooperative  Airway and Oxygen Therapy: Patient Spontanous Breathing and Patient connected to supplemental oxygen  Post-op Assessment: Post-op Vital signs reviewed, Patient's Cardiovascular Status Stable, Respiratory Function Stable, Patent Airway and No signs of Nausea or vomiting  Post-op Vital Signs: Reviewed and stable  Complications: No notable events documented.

## 2023-02-11 ENCOUNTER — Encounter: Payer: Self-pay | Admitting: Ophthalmology

## 2023-02-22 NOTE — Discharge Instructions (Signed)

## 2023-02-24 ENCOUNTER — Encounter: Payer: Self-pay | Admitting: Ophthalmology

## 2023-02-24 ENCOUNTER — Ambulatory Visit
Admission: RE | Admit: 2023-02-24 | Discharge: 2023-02-24 | Disposition: A | Discharge status: Home / Self Care | Payer: HMO | Attending: Ophthalmology | Admitting: Ophthalmology

## 2023-02-24 ENCOUNTER — Ambulatory Visit: Payer: HMO | Admitting: Registered Nurse

## 2023-02-24 ENCOUNTER — Encounter
Admission: RE | Disposition: A | Discharge status: Home / Self Care | Payer: Self-pay | Source: Home / Self Care | Attending: Ophthalmology

## 2023-02-24 ENCOUNTER — Other Ambulatory Visit: Payer: Self-pay

## 2023-02-24 DIAGNOSIS — F17200 Nicotine dependence, unspecified, uncomplicated: Secondary | ICD-10-CM | POA: Diagnosis not present

## 2023-02-24 DIAGNOSIS — Z79899 Other long term (current) drug therapy: Secondary | ICD-10-CM | POA: Insufficient documentation

## 2023-02-24 DIAGNOSIS — H2511 Age-related nuclear cataract, right eye: Secondary | ICD-10-CM | POA: Insufficient documentation

## 2023-02-24 DIAGNOSIS — F17290 Nicotine dependence, other tobacco product, uncomplicated: Secondary | ICD-10-CM | POA: Insufficient documentation

## 2023-02-24 DIAGNOSIS — K219 Gastro-esophageal reflux disease without esophagitis: Secondary | ICD-10-CM | POA: Diagnosis not present

## 2023-02-24 DIAGNOSIS — I10 Essential (primary) hypertension: Secondary | ICD-10-CM | POA: Diagnosis not present

## 2023-02-24 DIAGNOSIS — E1136 Type 2 diabetes mellitus with diabetic cataract: Secondary | ICD-10-CM | POA: Diagnosis not present

## 2023-02-24 HISTORY — PX: CATARACT EXTRACTION W/PHACO: SHX586

## 2023-02-24 SURGERY — PHACOEMULSIFICATION, CATARACT, WITH IOL INSERTION
Anesthesia: Monitor Anesthesia Care | Laterality: Right

## 2023-02-24 MED ORDER — SIGHTPATH DOSE#1 BSS IO SOLN
INTRAOCULAR | Status: DC | PRN
  Administered 2023-02-24: 67 mL via OPHTHALMIC

## 2023-02-24 MED ORDER — FENTANYL CITRATE (PF) 100 MCG/2ML IJ SOLN
INTRAMUSCULAR | Status: DC | PRN
  Administered 2023-02-24 (×2): 50 ug via INTRAVENOUS

## 2023-02-24 MED ORDER — CEFUROXIME OPHTHALMIC INJECTION 1 MG/0.1 ML
INJECTION | OPHTHALMIC | Status: DC | PRN
  Administered 2023-02-24: 1 mg via INTRACAMERAL

## 2023-02-24 MED ORDER — ARMC OPHTHALMIC DILATING DROPS
1.0000 | OPHTHALMIC | Status: DC | PRN
  Administered 2023-02-24 (×3): 1 via OPHTHALMIC

## 2023-02-24 MED ORDER — MIDAZOLAM HCL 2 MG/2ML IJ SOLN
INTRAMUSCULAR | Status: AC
  Filled 2023-02-24: qty 2

## 2023-02-24 MED ORDER — TETRACAINE HCL 0.5 % OP SOLN
1.0000 [drp] | OPHTHALMIC | Status: DC | PRN
  Administered 2023-02-24 (×3): 1 [drp] via OPHTHALMIC

## 2023-02-24 MED ORDER — SODIUM CHLORIDE 0.9% FLUSH: 10.0000 mL | Freq: Two times a day (BID) | INTRAVENOUS | Status: DC

## 2023-02-24 MED ORDER — SIGHTPATH DOSE#1 NA HYALUR & NA CHOND-NA HYALUR IO KIT
PACK | INTRAOCULAR | Status: DC | PRN
  Administered 2023-02-24: 1 via OPHTHALMIC

## 2023-02-24 MED ORDER — BRIMONIDINE TARTRATE-TIMOLOL 0.2-0.5 % OP SOLN
OPHTHALMIC | Status: DC | PRN
  Administered 2023-02-24: 1 [drp] via OPHTHALMIC

## 2023-02-24 MED ORDER — MIDAZOLAM HCL 2 MG/2ML IJ SOLN
INTRAMUSCULAR | Status: DC | PRN
  Administered 2023-02-24: 2 mg via INTRAVENOUS

## 2023-02-24 MED ORDER — SIGHTPATH DOSE#1 BSS IO SOLN
INTRAOCULAR | Status: DC | PRN
  Administered 2023-02-24: 2 mL

## 2023-02-24 MED ORDER — FENTANYL CITRATE (PF) 100 MCG/2ML IJ SOLN
INTRAMUSCULAR | Status: AC
  Filled 2023-02-24: qty 2

## 2023-02-24 MED ORDER — TETRACAINE HCL 0.5 % OP SOLN
OPHTHALMIC | Status: AC
Start: 2023-02-24 — End: ?
  Filled 2023-02-24: qty 4

## 2023-02-24 MED ORDER — SIGHTPATH DOSE#1 BSS IO SOLN
INTRAOCULAR | Status: DC | PRN
  Administered 2023-02-24: 15 mL via INTRAOCULAR

## 2023-02-24 SURGICAL SUPPLY — 10 items
CATARACT SUITE SIGHTPATH (MISCELLANEOUS) ×1
FEE CATARACT SUITE SIGHTPATH (MISCELLANEOUS) ×1 IMPLANT
GLOVE SRG 8 PF TXTR STRL LF DI (GLOVE) ×1 IMPLANT
GLOVE SURG ENC TEXT LTX SZ7.5 (GLOVE) ×1 IMPLANT
LENS CLRN VIVITY TORIC 23.0 ×1 IMPLANT
LENS IOL CLRN VT TRC 4 23.0 IMPLANT
NDL FILTER BLUNT 18X1 1/2 (NEEDLE) ×1 IMPLANT
NEEDLE FILTER BLUNT 18X1 1/2 (NEEDLE) ×1
RING MALYGIN 7.0 (MISCELLANEOUS) IMPLANT
SYR 3ML LL SCALE MARK (SYRINGE) ×1 IMPLANT

## 2023-02-24 NOTE — Transfer of Care (Signed)
Immediate Anesthesia Transfer of Care Note  Patient: Zachary Robertson  Procedure(s) Performed: CATARACT EXTRACTION PHACO AND INTRAOCULAR LENS PLACEMENT (IOC) RIGHT MALYUGIN  CLAREON VIVITY TORIC 9.59 00:45.5 (Right)  Patient Location: PACU  Anesthesia Type: MAC  Level of Consciousness: awake, alert  and patient cooperative  Airway and Oxygen Therapy: Patient Spontanous Breathing and Patient connected to supplemental oxygen  Post-op Assessment: Post-op Vital signs reviewed, Patient's Cardiovascular Status Stable, Respiratory Function Stable, Patent Airway and No signs of Nausea or vomiting  Post-op Vital Signs: Reviewed and stable  Complications: No notable events documented.

## 2023-02-24 NOTE — Anesthesia Preprocedure Evaluation (Signed)
Anesthesia Evaluation  Patient identified by MRN, date of birth, ID band Patient awake    Reviewed: Allergy & Precautions, H&P , NPO status , Patient's Chart, lab work & pertinent test results, reviewed documented beta blocker date and time   Airway Mallampati: II  TM Distance: >3 FB Neck ROM: full    Dental no notable dental hx. (+) Teeth Intact   Pulmonary neg pulmonary ROS, Current Smoker and Patient abstained from smoking.   Pulmonary exam normal breath sounds clear to auscultation       Cardiovascular Exercise Tolerance: Good hypertension, On Medications negative cardio ROS  Rhythm:regular Rate:Normal     Neuro/Psych negative neurological ROS  negative psych ROS   GI/Hepatic Neg liver ROS,GERD  Medicated,,  Endo/Other  negative endocrine ROSdiabetes, Well Controlled    Renal/GU Renal disease     Musculoskeletal   Abdominal   Peds  Hematology negative hematology ROS (+)   Anesthesia Other Findings   Reproductive/Obstetrics negative OB ROS                             Anesthesia Physical Anesthesia Plan  ASA: 3  Anesthesia Plan: MAC   Post-op Pain Management:    Induction:   PONV Risk Score and Plan:   Airway Management Planned:   Additional Equipment:   Intra-op Plan:   Post-operative Plan:   Informed Consent: I have reviewed the patients History and Physical, chart, labs and discussed the procedure including the risks, benefits and alternatives for the proposed anesthesia with the patient or authorized representative who has indicated his/her understanding and acceptance.       Plan Discussed with: CRNA  Anesthesia Plan Comments:        Anesthesia Quick Evaluation

## 2023-02-24 NOTE — Op Note (Signed)
LOCATION:  [facility]   PREOPERATIVE DIAGNOSIS:  Nuclear sclerotic cataract of the right eye with miotic pupil  H25.11   POSTOPERATIVE DIAGNOSIS:  Nuclear sclerotic cataract of the right eye with miotic pupil   PROCEDURE:  Phacoemulsification with Toric posterior chamber intraocular lens placement of the right eye with Malyugin ring placement  Ultrasound time: Procedure(s): CATARACT EXTRACTION PHACO AND INTRAOCULAR LENS PLACEMENT (IOC) RIGHT MALYUGIN  CLAREON VIVITY TORIC 9.59 00:45.5 (Right)  LENS:   Implant Name Type Inv. Item Serial No. Manufacturer Lot No. LRB No. Used Action  [id number] Zachary Robertson VIVITY   [id number] ALCON  Right 1 Implanted     23.0D Vivity Toric intraocular lens with 2.25 diopters of cylindrical power with axis orientation at 170 degrees.   SURGEON:  Deirdre Evener, MD   ANESTHESIA: Topical with tetracaine drops and 2% Xylocaine jelly, augmented with 1% preservative-free intracameral lidocaine. .   COMPLICATIONS:  None.   DESCRIPTION OF PROCEDURE:  The patient was identified in the holding room and transported to the operating suite and placed in the supine position under the operating microscope.  The right eye was identified as the operative eye, and it was prepped and draped in the usual sterile ophthalmic fashion.    A clear-corneal paracentesis incision was made at the 12:00 position.  0.5 ml of preservative-free 1% lidocaine was injected into the anterior chamber. The anterior chamber was filled with Viscoat.  A 2.4 millimeter near clear corneal incision was then made at the 9:00 position. A Malyugin ring was placed to expand the pupil to 7mm. A cystotome and capsulorrhexis forceps were then used to make a curvilinear capsulorrhexis.  Hydrodissection and hydrodelineation were then performed using balanced salt solution.   Phacoemulsification was then used in stop and chop fashion to remove the lens, nucleus and epinucleus.  The remaining  cortex was aspirated using the irrigation and aspiration handpiece.  Provisc viscoelastic was then placed into the capsular bag to distend it for lens placement.  The Verion digital marker was used to align the implant at the intended axis.   A Toric lens was then injected into the capsular bag.  It was rotated clockwise until the axis marks on the lens were approximately 15 degrees in the counterclockwise direction to the intended alignment. The amlyugin ring was removed. The viscoelastic was aspirated from the eye using the irrigation aspiration handpiece.  Then, a Koch spatula through the sideport incision was used to rotate the lens in a clockwise direction until the axis markings of the intraocular lens were lined up with the Verion alignment.  Balanced salt solution was then used to hydrate the wounds. Cefuroxime 0.1 ml of a 10mg /ml solution was injected into the anterior chamber for a dose of 1 mg of intracameral antibiotic at the completion of the case.    The eye was noted to have a physiologic pressure and there was no wound leak noted.   Timolol and Brimonidine drops were applied to the eye.  The patient was taken to the recovery room in stable condition having had no complications of anesthesia or surgery.  Zachary Robertson 02/24/2023, 8:19 AM

## 2023-02-24 NOTE — H&P (Signed)
[facility]   Primary Care Physician:  [facility] Ophthalmologist: Dr. Lockie Mola  Pre-Procedure History & Physical: HPI:  Zachary Robertson is a 82 y.o. male here for ophthalmic surgery.   Past Medical History:  Diagnosis Date   Acid reflux 06/03/2015   Adenomatous colon polyp 08/12/2014   Overview:  On 05/25/2011 colonoscopy; repeat 05/2016 (Dr. Marva Panda)    BPH (benign prostatic hyperplasia)    Cancer (HCC)    LYMPHOMA   Dribbling    ED (erectile dysfunction)    Essential (primary) hypertension 06/03/2015   Fatigue    GERD (gastroesophageal reflux disease)    Glaucoma 08/07/2013   Hesitancy    HTN (hypertension)    Hypertension    Hypogonadism in male    Nocturia    Obese    Urgency-frequency syndrome     Past Surgical History:  Procedure Laterality Date   BOWEL RESECTION  09/18/2015   Procedure: SMALL BOWEL RESECTION;  Surgeon: Ricarda Frame, MD;  Location: [facility] ORS;  Service: General;;  with anastomosis   BOWEL RESECTION     CATARACT EXTRACTION W/PHACO Left 02/10/2023   Procedure: CATARACT EXTRACTION PHACO AND INTRAOCULAR LENS PLACEMENT (IOC) LEFT MALYUGIN  clareon vivity toric 9.10 01:01.9;  Surgeon: Lockie Mola, MD;  Location: [facility];  Service: Ophthalmology;  Laterality: Left;   COLONOSCOPY WITH PROPOFOL N/A 01/04/2017   Procedure: COLONOSCOPY WITH PROPOFOL;  Surgeon: Christena Deem, MD;  Location: [facility];  Service: Endoscopy;  Laterality: N/A;   HERNIA REPAIR     LAPAROSCOPY  09/18/2015   Procedure: LAPAROSCOPY DIAGNOSTIC;  Surgeon: Ricarda Frame, MD;  Location: [facility] ORS;  Service: General;;   LAPAROTOMY N/A 09/18/2015   Procedure: EXPLORATORY LAPAROTOMY;  Surgeon: Ricarda Frame, MD;  Location: [facility] ORS;  Service: General;  Laterality: N/A;    Prior to Admission medications   Medication Sig Start Date End Date Taking? Authorizing Provider  aspirin EC 81 MG tablet Take 81 mg by mouth daily.   Yes  [provider]  CHOLECALCIFEROL PO Take by mouth.   Yes [provider]  cyanocobalamin 1000 MCG tablet Take 2 tablets daily for 2 weeks, then reduce to 1 tablet daily thereafter for Vitamin B12 Deficiency. 04/25/21  Yes [provider]  finasteride (PROSCAR) 5 MG tablet Take 1 tablet (5 mg total) by mouth daily. 08/31/22  Yes McGowan, Carollee Herter A, PA-C  ibuprofen (ADVIL,MOTRIN) 200 MG tablet Take 200 mg by mouth every 6 (six) hours as needed.   Yes [provider]  latanoprost (XALATAN) 0.005 % ophthalmic solution Place 1 drop into both eyes every morning.  04/24/15  Yes [provider]  lisinopril-hydrochlorothiazide (PRINZIDE,ZESTORETIC) 10-12.5 MG tablet Take 1 tablet by mouth daily. 09/23/15 02/24/23 Yes Wieting, Richard, MD  Omega-3 Fatty Acids (FISH OIL) 1200 MG CAPS Take 1,200-2,400 mg by mouth See admin instructions. Take 2 capsules (2400mg ) by mouth every morning and 1 capsule by mouth in the evening.   Yes [provider]  pravastatin (PRAVACHOL) 40 MG tablet Take 40 mg by mouth daily. 08/11/19  Yes [provider]  timolol (TIMOPTIC) 0.5 % ophthalmic solution Apply 1 drop to eye every morning.  05/12/13  Yes [provider]    Allergies as of 11/18/2022   (No Known Allergies)    Family History  Problem Relation Age of Onset   Prostate cancer Father    Cancer Father    Cancer Other        family members in  general   Cancer Sister    Clotting disorder Sister    Cancer Brother    Bladder Cancer Neg Hx    Kidney cancer Neg Hx     Social History   Socioeconomic History   Marital status: Single    Spouse name: Not on file   Number of children: Not on file   Years of education: Not on file   Highest education level: Not on file  Occupational History   Not on file  Tobacco Use   Smoking status: Some Days    Types: Cigars    Last attempt to quit: 04/10/2015    Years since quitting: 7.8   Smokeless tobacco:  Never   Tobacco comments:    occasional cigar  Substance and Sexual Activity   Alcohol use: Yes    Alcohol/week: 0.0 standard drinks of alcohol    Comment: 1-2 drinks of Liquor Daily.   Drug use: No   Sexual activity: Not on file  Other Topics Concern   Not on file  Social History Narrative   Not on file   Social Determinants of Health   Financial Resource Strain: Not on file  Food Insecurity: Not on file  Transportation Needs: Not on file  Physical Activity: Not on file  Stress: Not on file  Social Connections: Not on file  Intimate Partner Violence: Not on file    Review of Systems: See HPI, otherwise negative ROS  Physical Exam: BP (!) 154/77   Pulse (!) 59   Temp 97.7 F (36.5 C) (Temporal)   Resp 20   Ht 5\' 10"  (1.778 m)   Wt 121 kg   SpO2 97%   BMI 38.27 kg/m  General:   Alert,  pleasant and cooperative in NAD Head:  Normocephalic and atraumatic. Lungs:  Clear to auscultation.    Heart:  Regular rate and rhythm.   Impression/Plan: Zachary Robertson is here for ophthalmic surgery.  Risks, benefits, limitations, and alternatives regarding ophthalmic surgery have been reviewed with the patient.  Questions have been answered.  All parties agreeable.   Lockie Mola, MD  02/24/2023, 7:29 AM

## 2023-02-24 NOTE — Anesthesia Postprocedure Evaluation (Signed)
Anesthesia Post Note  Patient: Zachary Robertson  Procedure(s) Performed: CATARACT EXTRACTION PHACO AND INTRAOCULAR LENS PLACEMENT (IOC) RIGHT MALYUGIN  CLAREON VIVITY TORIC 9.59 00:45.5 (Right)  Patient location during evaluation: PACU Anesthesia Type: MAC Level of consciousness: awake and alert Pain management: pain level controlled Vital Signs Assessment: post-procedure vital signs reviewed and stable Respiratory status: spontaneous breathing, nonlabored ventilation, respiratory function stable and patient connected to nasal cannula oxygen Cardiovascular status: stable and blood pressure returned to baseline Postop Assessment: no apparent nausea or vomiting Anesthetic complications: no   No notable events documented.   Last Vitals:  Vitals:   02/24/23 0820 02/24/23 0825  BP: (!) 146/63 (!) 145/64  Pulse: (!) 51 (!) 52  Resp: 20 11  Temp: (!) 36.4 C (!) 36.4 C  SpO2: 98% 97%    Last Pain:  Vitals:   02/24/23 0825  TempSrc:   PainSc: 0-No pain                 Yevette Edwards

## 2023-03-01 ENCOUNTER — Encounter: Payer: Self-pay | Admitting: Ophthalmology

## 2023-06-09 DIAGNOSIS — E559 Vitamin D deficiency, unspecified: Secondary | ICD-10-CM | POA: Diagnosis not present

## 2023-06-09 DIAGNOSIS — Z0000 Encounter for general adult medical examination without abnormal findings: Secondary | ICD-10-CM | POA: Diagnosis not present

## 2023-06-09 DIAGNOSIS — Z1331 Encounter for screening for depression: Secondary | ICD-10-CM | POA: Diagnosis not present

## 2023-06-09 DIAGNOSIS — C83398 Diffuse large b-cell lymphoma of other extranodal and solid organ sites: Secondary | ICD-10-CM | POA: Diagnosis not present

## 2023-06-09 DIAGNOSIS — I700 Atherosclerosis of aorta: Secondary | ICD-10-CM | POA: Diagnosis not present

## 2023-06-09 DIAGNOSIS — E782 Mixed hyperlipidemia: Secondary | ICD-10-CM | POA: Diagnosis not present

## 2023-06-09 DIAGNOSIS — E119 Type 2 diabetes mellitus without complications: Secondary | ICD-10-CM | POA: Diagnosis not present

## 2023-06-09 DIAGNOSIS — N401 Enlarged prostate with lower urinary tract symptoms: Secondary | ICD-10-CM | POA: Diagnosis not present

## 2023-06-09 DIAGNOSIS — I10 Essential (primary) hypertension: Secondary | ICD-10-CM | POA: Diagnosis not present

## 2023-06-09 DIAGNOSIS — R351 Nocturia: Secondary | ICD-10-CM | POA: Diagnosis not present

## 2023-06-25 DIAGNOSIS — H40003 Preglaucoma, unspecified, bilateral: Secondary | ICD-10-CM | POA: Diagnosis not present

## 2023-06-28 DIAGNOSIS — Z961 Presence of intraocular lens: Secondary | ICD-10-CM | POA: Diagnosis not present

## 2023-06-28 DIAGNOSIS — H43813 Vitreous degeneration, bilateral: Secondary | ICD-10-CM | POA: Diagnosis not present

## 2023-06-28 DIAGNOSIS — H40003 Preglaucoma, unspecified, bilateral: Secondary | ICD-10-CM | POA: Diagnosis not present

## 2023-07-12 DIAGNOSIS — R748 Abnormal levels of other serum enzymes: Secondary | ICD-10-CM | POA: Diagnosis not present

## 2023-09-06 ENCOUNTER — Ambulatory Visit: Payer: Self-pay | Admitting: Urology

## 2023-09-10 DIAGNOSIS — E782 Mixed hyperlipidemia: Secondary | ICD-10-CM | POA: Diagnosis not present

## 2023-09-10 DIAGNOSIS — E119 Type 2 diabetes mellitus without complications: Secondary | ICD-10-CM | POA: Diagnosis not present

## 2023-09-10 DIAGNOSIS — I700 Atherosclerosis of aorta: Secondary | ICD-10-CM | POA: Diagnosis not present

## 2023-09-10 DIAGNOSIS — I10 Essential (primary) hypertension: Secondary | ICD-10-CM | POA: Diagnosis not present

## 2023-09-19 NOTE — Progress Notes (Unsigned)
 08/22/19 2:34 PM   Zachary Robertson 12/05/1940 [id number]  Referring provider: [facility] [address] [place],  [place] [address]  Urological history: 1. BPH with LU TS - PSA 0.5 in 2018 - prostate volume 34 cc on 11/2019 CT - managed with finasteride  5 mg daily   2. Family history of prostate cancer - father with prostate cancer  3. Bilateral renal cysts - largest cyst at 4 cm on left kidney on 11/2019 CT  No chief complaint on file.   HPI: Zachary Robertson is a 83 y.o. man who presents today for a one year follow up.    Previous records reviewed.     IPSS score: *** PVR: ***   Previous score: 8/2     Major complaint(s):  x *** years. Denies any dysuria, hematuria or suprapubic pain.   Currently taking: finasteride  5 mg daily.  He has had ***.   Denies any recent fevers, chills, nausea or vomiting.  Serum creatinine (06/2023) 1.1, eGFR 67  Hbg A1c (05/2023) 6.6     Score:  1-7 Mild 8-19 Moderate 20-35 Severe    I PSS 8/2  No complaints.  Patient denies any modifying or aggravating factors.  Patient denies any gross hematuria, dysuria or suprapubic/flank pain.  Patient denies any fevers, chills, nausea or vomiting.         Score:  1-7 Mild 8-19 Moderate 20-35 Severe  PMH: Past Medical History:  Diagnosis Date   Acid reflux 06/03/2015   Adenomatous colon polyp 08/12/2014   Overview:  On 05/25/2011 colonoscopy; repeat 05/2016 (Dr. Gaylyn)    BPH (benign prostatic hyperplasia)    Cancer (HCC)    LYMPHOMA   Dribbling    ED (erectile dysfunction)    Essential (primary) hypertension 06/03/2015   Fatigue    GERD (gastroesophageal reflux disease)    Glaucoma 08/07/2013   Hesitancy    HTN (hypertension)    Hypertension    Hypogonadism in male    Nocturia    Obese    Urgency-frequency syndrome     Surgical History: Past Surgical History:  Procedure Laterality Date   BOWEL RESECTION  09/18/2015   Procedure: SMALL BOWEL RESECTION;   Surgeon: Carlin Pastel, MD;  Location: [facility];  Service: General;;  with anastomosis   BOWEL RESECTION     CATARACT EXTRACTION W/PHACO Left 02/10/2023   Procedure: CATARACT EXTRACTION PHACO AND INTRAOCULAR LENS PLACEMENT (IOC) LEFT MALYUGIN  clareon vivity toric 9.10 01:01.9;  Surgeon: Mittie Gaskin, MD;  Location: [facility];  Service: Ophthalmology;  Laterality: Left;   CATARACT EXTRACTION W/PHACO Right 02/24/2023   Procedure: CATARACT EXTRACTION PHACO AND INTRAOCULAR LENS PLACEMENT (IOC) RIGHT MALYUGIN  CLAREON VIVITY TORIC 9.59 00:45.5;  Surgeon: Mittie Gaskin, MD;  Location: [facility];  Service: Ophthalmology;  Laterality: Right;   COLONOSCOPY WITH PROPOFOL  N/A 01/04/2017   Procedure: COLONOSCOPY WITH PROPOFOL ;  Surgeon: Gaylyn Gladis PENNER, MD;  Location: [facility];  Service: Endoscopy;  Laterality: N/A;   HERNIA REPAIR     LAPAROSCOPY  09/18/2015   Procedure: LAPAROSCOPY DIAGNOSTIC;  Surgeon: Carlin Pastel, MD;  Location: [facility];  Service: General;;   LAPAROTOMY N/A 09/18/2015   Procedure: EXPLORATORY LAPAROTOMY;  Surgeon: Carlin Pastel, MD;  Location: [facility];  Service: General;  Laterality: N/A;    Home Medications:  Allergies as of 09/20/2023   No Known Allergies      Medication List        Accurate as of September 19, 2023  2:34 PM. If you have any questions, ask your nurse or doctor.          aspirin EC 81 MG tablet Take 81 mg by mouth daily.   CHOLECALCIFEROL PO Take by mouth.   cyanocobalamin  1000 MCG tablet Take 2 tablets daily for 2 weeks, then reduce to 1 tablet daily thereafter for Vitamin B12 Deficiency.   finasteride  5 MG tablet Commonly known as: PROSCAR  Take 1 tablet (5 mg total) by mouth daily.   Fish Oil 1200 MG Caps Take 1,200-2,400 mg by mouth See admin instructions. Take 2 capsules (2400mg ) by mouth every morning and 1 capsule by mouth in the evening.   ibuprofen 200 MG tablet Commonly known as:  ADVIL Take 200 mg by mouth every 6 (six) hours as needed.   latanoprost  0.005 % ophthalmic solution Commonly known as: XALATAN  Place 1 drop into both eyes every morning.   lisinopril -hydrochlorothiazide  10-12.5 MG tablet Commonly known as: ZESTORETIC  Take 1 tablet by mouth daily.   pravastatin 40 MG tablet Commonly known as: PRAVACHOL Take 40 mg by mouth daily.   timolol  0.5 % ophthalmic solution Commonly known as: TIMOPTIC  Apply 1 drop to eye every morning.        Allergies: No Known Allergies  Family History: Family History  Problem Relation Age of Onset   Prostate cancer Father    Cancer Father    Cancer Other        family members in general   Cancer Sister    Clotting disorder Sister    Cancer Brother    Bladder Cancer Neg Hx    Kidney cancer Neg Hx     Social History:  reports that he has been smoking cigars. He has never used smokeless tobacco. He reports current alcohol use. He reports that he does not use drugs.   Physical Exam: There were no vitals taken for this visit.  Constitutional:  Well nourished. Alert and oriented, No acute distress. HEENT: [place] AT, moist mucus membranes.  Trachea midline, no masses. Cardiovascular: No clubbing, cyanosis, or edema. Respiratory: Normal respiratory effort, no increased work of breathing. GI: Abdomen is soft, non tender, non distended, no abdominal masses. Liver and spleen not palpable.  No hernias appreciated.  Stool sample for occult testing is not indicated.   GU: No CVA tenderness.  No bladder fullness or masses.  Patient with circumcised/uncircumcised phallus. ***Foreskin easily retracted***  Urethral meatus is patent.  No penile discharge. No penile lesions or rashes. Scrotum without lesions, cysts, rashes and/or edema.  Testicles are located scrotally bilaterally. No masses are appreciated in the testicles. Left and right epididymis are normal. Rectal: Patient with  normal sphincter tone. Anus and perineum without  scarring or rashes. No rectal masses are appreciated. Prostate is approximately *** grams, *** nodules are appreciated. Seminal vesicles are normal. Skin: No rashes, bruises or suspicious lesions. Lymph: No cervical or inguinal adenopathy. Neurologic: Grossly intact, no focal deficits, moving all 4 extremities. Psychiatric: Normal mood and affect.   Laboratory Data: See EPIC and HPI I have reviewed the labs.  See HPI.     Pertinent Imaging: N/A  Assessment & Plan:    1. BPH with LU TS - stable, improving, worsening mild, moderate severe symptoms *** - no signs of retention, infection or malignancy *** - aged out of PSA screening  - UA benign *** - PVR < 300 cc *** - most bothersome symptoms are *** - encouraged avoiding bladder irritants, fluid restriction before bedtime  and timed voiding's - Initiate alpha-blocker (***), discussed side effects *** - continue finasteride  5 mg daily - Cannot tolerate medication or medication failure, schedule cystoscopy *** - educated on red flag symptoms: acute retention, gross hematuria, fever, severe pain - advised to call clinic or go to the ED if these occur - return to clinic in *** symptom re-evaluation ***    CLOTILDA CORNWALL, PA-C   [facility] [address] [place], [place] [address] [phone]

## 2023-09-20 ENCOUNTER — Ambulatory Visit: Admitting: Urology

## 2023-09-20 ENCOUNTER — Encounter: Payer: Self-pay | Admitting: Urology

## 2023-09-20 VITALS — BP 152/78 | HR 78 | Ht 70.0 in | Wt 266.6 lb

## 2023-09-20 DIAGNOSIS — N138 Other obstructive and reflux uropathy: Secondary | ICD-10-CM

## 2023-09-20 DIAGNOSIS — N401 Enlarged prostate with lower urinary tract symptoms: Secondary | ICD-10-CM

## 2023-09-20 MED ORDER — FINASTERIDE 5 MG PO TABS
5.0000 mg | ORAL_TABLET | Freq: Every day | ORAL | 3 refills | Status: AC
Start: 2023-09-20 — End: ?

## 2023-12-28 DIAGNOSIS — Z961 Presence of intraocular lens: Secondary | ICD-10-CM | POA: Diagnosis not present

## 2023-12-28 DIAGNOSIS — H40003 Preglaucoma, unspecified, bilateral: Secondary | ICD-10-CM | POA: Diagnosis not present

## 2024-01-04 DIAGNOSIS — I10 Essential (primary) hypertension: Secondary | ICD-10-CM | POA: Diagnosis not present

## 2024-01-04 DIAGNOSIS — Z1331 Encounter for screening for depression: Secondary | ICD-10-CM | POA: Diagnosis not present

## 2024-01-04 DIAGNOSIS — E559 Vitamin D deficiency, unspecified: Secondary | ICD-10-CM | POA: Diagnosis not present

## 2024-01-04 DIAGNOSIS — N401 Enlarged prostate with lower urinary tract symptoms: Secondary | ICD-10-CM | POA: Diagnosis not present

## 2024-01-04 DIAGNOSIS — I700 Atherosclerosis of aorta: Secondary | ICD-10-CM | POA: Diagnosis not present

## 2024-01-04 DIAGNOSIS — E782 Mixed hyperlipidemia: Secondary | ICD-10-CM | POA: Diagnosis not present

## 2024-01-04 DIAGNOSIS — R351 Nocturia: Secondary | ICD-10-CM | POA: Diagnosis not present

## 2024-01-04 DIAGNOSIS — E119 Type 2 diabetes mellitus without complications: Secondary | ICD-10-CM | POA: Diagnosis not present

## 2024-01-04 DIAGNOSIS — C83398 Diffuse large b-cell lymphoma of other extranodal and solid organ sites: Secondary | ICD-10-CM | POA: Diagnosis not present

## 2024-08-22 ENCOUNTER — Ambulatory Visit: Admitting: Urology
